# Patient Record
Sex: Female | Born: 1975 | Race: Black or African American | Hispanic: Yes | Marital: Single | Smoking: Former smoker
Health system: Southern US, Community
[De-identification: ages and names within clinical notes are randomized; demographics above are authoritative.]

## PROBLEM LIST (undated history)

## (undated) DIAGNOSIS — I2699 Other pulmonary embolism without acute cor pulmonale: Secondary | ICD-10-CM

## (undated) DIAGNOSIS — I429 Cardiomyopathy, unspecified: Secondary | ICD-10-CM

## (undated) DIAGNOSIS — I10 Essential (primary) hypertension: Secondary | ICD-10-CM

## (undated) DIAGNOSIS — I509 Heart failure, unspecified: Secondary | ICD-10-CM

## (undated) HISTORY — DX: Other pulmonary embolism without acute cor pulmonale: I26.99

## (undated) HISTORY — DX: Heart failure, unspecified: I50.9

## (undated) NOTE — *Deleted (*Deleted)
***In Progress*** PCP: Raliegh Ip FNP  Primary Cardiologist: Dr Gala Romney   HPI:  Alexandria Rhodes a 65 y.o.femalewith a historyofnon-ischemic cardiomyopathy with EF as low as 10% in 2016but most recent 65-70% on Echo in 03/2018, prior PE in 03/2018 no longer on anticoagulation, migraine, and hypertension.  Shewas admitted to a hospital in [place] in 08/2014 after presenting with chest pain and shortness of breath. She was found to have an EF of 10%. She states she underwent heart cath which showed no CAD. She was [facility] [place]. She was started onguideline medical therapy and was onEntresto 97-103 mg twice daily,carvedilol 6.25 mg twice daily,and Spironolactone 12.5 mg daily.She moved to [place] at the end of 2019.  Admitted to [facility] 03/2018 with chest pain and found to have an acute PE felt to be provoked by recent move from [place]. Echo at that time showed LVEF of 65-70% with normal wall motion, grade 1 diastolic dysfunction, trivial MR, normal biatrial size, normal RV function, and dilated IVC.She was started on Eliquis but was only able to complete 1 month of therapy due to cost. She also could not afford her heart failure medications and has not been on anything in the past year.  Admitted to [facility] on 07/20/19 with increased shortness of breath. She had been out of her medications for a week. Diuresed with IV furosemide and started on HF meds. D/C weight was 156 pounds. Given all medications from HF fund at discharge. Referred to HF social work to help with meds and transportation to and from appts.   She was seen for initial post hospital follow-up back in June and was doing fairly well from a cardiac standpoint.  She did however endorse a rash on her torso and upper extremities which she felt was most likely secondary to digoxin since it was a new medication that had just been started. Digoxin was  discontinued.  Due to soft BP, we were unable to further titrate her medications at that time.  She was instructed to follow-up with pharmacy 2 to 3 weeks later for further med titration however she canceled the appointment and did not reschedule.   She returned to clinic with [name], PA, on 12/14/19 for follow-up. She is currently working at [employer]. She reported compliance with most of her meds but she ran out of spironolactone and carvedilol 3 days ago. HR 55 bpm off ? blocker. BP soft 105/70. Weight up 7 lb since last OV.  Reports stable NYHA class II symptoms  Today he returns to HF clinic for pharmacist medication titration. At last visit with [name], PA, Farxiga 10 mg daily was initiated and spironolactone was re-initiated at a dose of 12.5 mg daily.   Overall feeling ***. Dizziness, lightheadedness, fatigue:  Chest pain or palpitations.  How is your breathing?: *** SOB Able to complete all ADLs. Activity level ***  Weight at home pounds. Takes furosemide/torsemide/bumex *** mg *** daily.  PND/Orthopnea:   Appetite ***.   HF Medications: Entresto 24/26 mg BID  Spironolactone 12.5 mg daily  Dapagliflozin 10 mg daily  Furosemide 40 mg PRN  Potassium chloride 20 mg PRN with furosemide  Has the patient been experiencing any side effects to the medications prescribed?  {YES NO:22349}  Does the patient have any problems obtaining medications due to transportation or finances?   {YES NO:22349} Uninsured, approved for [employer] patient assistance. Transportation set up by CSW  Understanding of regimen: {  excellent/good/fair/poor:19665} Understanding of indications: {excellent/good/fair/poor:19665} Potential of compliance: {excellent/good/fair/poor:19665} Patient understands to avoid NSAIDs. Patient understands to avoid decongestants.  Pertinent Lab Values 08/22/19:  Serum creatinine 1.09, BUN 20, Potassium 5.1, Sodium 142  Vital Signs: . Weight: *** (last  clinic weight: 160 lbs) . Blood pressure:  . Heart rate:   Assessment: 1. Chronic Combined Heart Failure  - EF as low as 10% back in 2016. She was started on guideline based medications and EF improved to 65-70%on echo in 03/2018.  -ECHO 5/21 EF back down to 20-25% (in the setting of poor med compliance) with global hypokinesis, grade II diastolic dysfunction, severely dilated left atrium, moderate MR, and trivial AI. RV size normal with systolic function mildly reduced. GDMT restarted.  - NYHA II. Volume status trending up.  Weight up 7 pounds from last office visit  - Continue furosemide 40 mg daily - Continue Entresto 24/26 mg BID -Continue spironolactone 12.5 mg QHS -Continue dapagliflozin 10 mg daily -She is off digoxin given previous rash - Discussed importance of daily weights and medication compliance.  - Plan to repeat ECHO  after HF medications optimized.   2. HTN -Soft but stable -See med recs above   3. History of PE - Provoked in the setting of prolonged travel - Previously treated with Eliquis. No longer on anticoagulation    Plan: 1) Medication changes: Based on clinical presentation, vital signs and recent labs will *** 2) Labs: *** 3) Follow-up with APP clinic in 4 weeks   Karle Plumber, PharmD, BCPS, BCCP, CPP Heart Failure Clinic Pharmacist [phone]

---

## 2018-03-27 ENCOUNTER — Other Ambulatory Visit: Payer: Self-pay

## 2018-03-27 ENCOUNTER — Inpatient Hospital Stay (HOSPITAL_COMMUNITY)
Admission: EM | Admit: 2018-03-27 | Discharge: 2018-03-29 | DRG: 176 | Disposition: A | Discharge status: Home / Self Care | Payer: Self-pay | Attending: Internal Medicine | Admitting: Internal Medicine

## 2018-03-27 ENCOUNTER — Emergency Department (HOSPITAL_COMMUNITY): Payer: Self-pay

## 2018-03-27 ENCOUNTER — Encounter (HOSPITAL_COMMUNITY): Payer: Self-pay | Admitting: Obstetrics and Gynecology

## 2018-03-27 DIAGNOSIS — I5022 Chronic systolic (congestive) heart failure: Secondary | ICD-10-CM | POA: Diagnosis present

## 2018-03-27 DIAGNOSIS — I110 Hypertensive heart disease with heart failure: Secondary | ICD-10-CM | POA: Diagnosis present

## 2018-03-27 DIAGNOSIS — I428 Other cardiomyopathies: Secondary | ICD-10-CM | POA: Diagnosis present

## 2018-03-27 DIAGNOSIS — R072 Precordial pain: Secondary | ICD-10-CM

## 2018-03-27 DIAGNOSIS — Z79899 Other long term (current) drug therapy: Secondary | ICD-10-CM

## 2018-03-27 DIAGNOSIS — I10 Essential (primary) hypertension: Secondary | ICD-10-CM | POA: Diagnosis present

## 2018-03-27 DIAGNOSIS — I2699 Other pulmonary embolism without acute cor pulmonale: Principal | ICD-10-CM | POA: Diagnosis present

## 2018-03-27 DIAGNOSIS — Z91013 Allergy to seafood: Secondary | ICD-10-CM

## 2018-03-27 DIAGNOSIS — R0602 Shortness of breath: Secondary | ICD-10-CM

## 2018-03-27 HISTORY — DX: Cardiomyopathy, unspecified: I42.9

## 2018-03-27 HISTORY — DX: Essential (primary) hypertension: I10

## 2018-03-27 LAB — POCT I-STAT TROPONIN I
Troponin i, poc: 0 ng/mL (ref 0.00–0.08)
Troponin i, poc: 0.01 ng/mL (ref 0.00–0.08)

## 2018-03-27 LAB — BASIC METABOLIC PANEL
Anion gap: 9 (ref 5–15)
BUN: 16 mg/dL (ref 6–20)
CO2: 23 mmol/L (ref 22–32)
Calcium: 9.1 mg/dL (ref 8.9–10.3)
Chloride: 111 mmol/L (ref 98–111)
Creatinine, Ser: 0.74 mg/dL (ref 0.44–1.00)
GFR calc Af Amer: >60 mL/min (ref >60)
GFR calc non Af Amer: >60 mL/min (ref >60)
Glucose, Bld: 97 mg/dL (ref 70–99)
Potassium: 4 mmol/L (ref 3.5–5.1)
Sodium: 143 mmol/L (ref 135–145)

## 2018-03-27 LAB — BRAIN NATRIURETIC PEPTIDE: B NATRIURETIC PEPTIDE 5: 25.8 pg/mL (ref 0.0–100.0)

## 2018-03-27 LAB — CBC
HCT: 37.1 % (ref 36.0–46.0)
Hemoglobin: 11.3 g/dL — ABNORMAL LOW (ref 12.0–15.0)
MCH: 28 pg (ref 26.0–34.0)
MCHC: 30.5 g/dL (ref 30.0–36.0)
MCV: 92.1 fL (ref 80.0–100.0)
Platelets: 300 10*3/uL (ref 150–400)
RBC: 4.03 MIL/uL (ref 3.87–5.11)
RDW: 13.4 % (ref 11.5–15.5)
WBC: 7.6 10*3/uL (ref 4.0–10.5)
nRBC: 0 % (ref 0.0–0.2)

## 2018-03-27 LAB — HCG, QUANTITATIVE, PREGNANCY: HCG, BETA CHAIN, QUANT, S: 9 m[IU]/mL — AB (ref <5)

## 2018-03-27 LAB — HCG, SERUM, QUALITATIVE: Preg, Serum: POSITIVE — AB

## 2018-03-27 LAB — MAGNESIUM: Magnesium: 2 mg/dL (ref 1.7–2.4)

## 2018-03-27 LAB — I-STAT BETA HCG BLOOD, ED (NOT ORDERABLE): I-stat hCG, quantitative: 9.4 m[IU]/mL — ABNORMAL HIGH (ref <5)

## 2018-03-27 LAB — D-DIMER, QUANTITATIVE: D-Dimer, Quant: 1.1 ug/mL-FEU — ABNORMAL HIGH (ref 0.00–0.50)

## 2018-03-27 MED ORDER — SPIRONOLACTONE 12.5 MG HALF TABLET
12.5000 mg | ORAL_TABLET | Freq: Every day | ORAL | Status: DC
  Administered 2018-03-28 – 2018-03-29 (×2): 12.5 mg via ORAL
  Filled 2018-03-27 (×2): qty 1

## 2018-03-27 MED ORDER — ONDANSETRON HCL 4 MG PO TABS: 4.0000 mg | ORAL_TABLET | Freq: Four times a day (QID) | ORAL | Status: DC | PRN

## 2018-03-27 MED ORDER — ONDANSETRON HCL 4 MG/2ML IJ SOLN: 4.0000 mg | Freq: Four times a day (QID) | INTRAMUSCULAR | Status: DC | PRN

## 2018-03-27 MED ORDER — SODIUM CHLORIDE (PF) 0.9 % IJ SOLN
INTRAMUSCULAR | Status: AC
  Filled 2018-03-27: qty 50

## 2018-03-27 MED ORDER — ACETAMINOPHEN 325 MG PO TABS
650.0000 mg | ORAL_TABLET | Freq: Four times a day (QID) | ORAL | Status: DC | PRN
  Administered 2018-03-28 (×2): 650 mg via ORAL
  Filled 2018-03-27 (×2): qty 2

## 2018-03-27 MED ORDER — SACUBITRIL-VALSARTAN 97-103 MG PO TABS
1.0000 | ORAL_TABLET | Freq: Two times a day (BID) | ORAL | Status: DC
  Administered 2018-03-28 – 2018-03-29 (×3): 1 via ORAL
  Filled 2018-03-27 (×5): qty 1

## 2018-03-27 MED ORDER — HEPARIN BOLUS VIA INFUSION
2000.0000 [IU] | Freq: Once | INTRAVENOUS | Status: AC
  Administered 2018-03-28: 2000 [IU] via INTRAVENOUS
  Filled 2018-03-27: qty 2000

## 2018-03-27 MED ORDER — ACETAMINOPHEN 325 MG PO TABS: 650.0000 mg | ORAL_TABLET | Freq: Four times a day (QID) | ORAL | Status: DC | PRN

## 2018-03-27 MED ORDER — ACETAMINOPHEN 650 MG RE SUPP: 650.0000 mg | Freq: Four times a day (QID) | RECTAL | Status: DC | PRN

## 2018-03-27 MED ORDER — IOPAMIDOL (ISOVUE-370) INJECTION 76%
INTRAVENOUS | Status: AC
  Filled 2018-03-27: qty 100

## 2018-03-27 MED ORDER — IOPAMIDOL (ISOVUE-370) INJECTION 76%
100.0000 mL | Freq: Once | INTRAVENOUS | Status: AC | PRN
  Administered 2018-03-27: 100 mL via INTRAVENOUS

## 2018-03-27 MED ORDER — HEPARIN (PORCINE) 25000 UT/250ML-% IV SOLN
1300.0000 [IU]/h | INTRAVENOUS | Status: DC
  Administered 2018-03-28: 1300 [IU]/h via INTRAVENOUS
  Filled 2018-03-27: qty 250

## 2018-03-27 MED ORDER — CARVEDILOL 6.25 MG PO TABS
6.2500 mg | ORAL_TABLET | Freq: Two times a day (BID) | ORAL | Status: DC
  Administered 2018-03-28 – 2018-03-29 (×4): 6.25 mg via ORAL
  Filled 2018-03-27 (×4): qty 1

## 2018-03-27 NOTE — H&P (Signed)
History and Physical    Alexandria Rhodes OIN:[id number] DOB: [birth year] DOA: 03/27/2018  PCP: Patient, No Pcp Per  Patient coming from: Home  I have personally briefly reviewed patient's old medical records in [facility]  Chief Complaint: Chest pain  HPI: Alexandria Rhodes is a 43 y.o. female with medical history significant of cardiomyopathy, presumably non-ischemic since they "couldn't figure out why" heart pumping was reduced.  HTN.  EF 10% initially in 2017 then repeat echo later that same year showed 35%.  She is on coreg, entresto, aldactone.  Missed entresto for past month though.  Not on anti-platelet agents or statins (also a hint that its NICM).  Patient recently moved to area from [place].  Presents to ED with c/o chest pressure, DOE.  Symptoms onset 2 days ago, persistent.  Associated intermittent sharp chest pain.  No hemoptysis.  Intermittently LE edema.   ED Course: CTA positive for PE.  b-HCG slightly positive at 9.4, patient states she is post-menopausal with last period at least 4+ months ago, last intercourse was "a long time ago".   Review of Systems: As per HPI otherwise 10 point review of systems negative.   Past Medical History:  Diagnosis Date  . Cardiomyopathy (HCC)   . Hypertension     History reviewed. No pertinent surgical history.   reports that she has never smoked. She does not have any smokeless tobacco history on file. She reports previous alcohol use. She reports previous drug use.  Allergies  Allergen Reactions  . Shellfish Allergy Anaphylaxis    No family history on file. No FHx   Prior to Admission medications   Medication Sig Start Date End Date Taking? Authorizing Provider  acetaminophen (TYLENOL) 325 MG tablet Take 650 mg by mouth every 6 (six) hours as needed for moderate pain or headache.   Yes [provider]  carvedilol (COREG) 6.25 MG tablet Take 6.25 mg by mouth 2 (two) times daily with a meal.   Yes [provider]  sacubitril-valsartan (ENTRESTO) 97-103 MG Take 1 tablet by mouth 2 (two) times daily.   Yes [provider]  spironolactone (ALDACTONE) 25 MG tablet Take 12.5 mg by mouth daily.   Yes [provider]  SUMAtriptan (IMITREX) 50 MG tablet Take 50 mg by mouth every 2 (two) hours as needed for migraine. May repeat in 2 hours if headache persists or recurs.   Yes [provider]    Physical Exam: Vitals:   03/27/18 1856 03/27/18 1901 03/27/18 2100  BP: (!) 171/102  (!) 165/98  Pulse: 76  69  Resp: 16  15  Temp: 98.6 F (37 C)    TempSrc: Oral    SpO2: 99%  97%  Weight:  91.2 kg   Height:  5\' 2"  (1.575 m)     Constitutional: NAD, calm, comfortable Eyes: PERRL, lids and conjunctivae normal ENMT: Mucous membranes are moist. Posterior pharynx clear of any exudate or lesions.Normal dentition.  Neck: normal, supple, no masses, no thyromegaly Respiratory: clear to auscultation bilaterally, no wheezing, no crackles. Normal respiratory effort. No accessory muscle use.  Cardiovascular: Systolic murmur. Mild peripheral edema.  Abdomen: no tenderness, no masses palpated. No hepatosplenomegaly. Bowel sounds positive.  Musculoskeletal: no clubbing / cyanosis. No joint deformity upper and lower extremities. Good ROM, no contractures. Normal muscle tone.  Skin: no rashes, lesions, ulcers. No induration Neurologic: CN 2-12 grossly intact. Sensation intact, DTR normal. Strength 5/5 in all 4.  Psychiatric: Normal judgment and insight. Alert and  oriented x 3. Normal mood.    Labs on Admission: I have personally reviewed following labs and imaging studies  CBC: Recent Labs  Lab 03/27/18 1930  WBC 7.6  HGB 11.3*  HCT 37.1  MCV 92.1  PLT 300   Basic Metabolic Panel: Recent Labs  Lab 03/27/18 1930 03/27/18 1942  NA 143  --   K 4.0  --   CL 111  --   CO2 23  --   GLUCOSE 97  --   BUN 16  --   CREATININE 0.74  --   CALCIUM 9.1  --   MG  --  2.0    GFR: Estimated Creatinine Clearance: 95.2 mL/min (by C-G formula based on SCr of 0.74 mg/dL). Liver Function Tests: No results for input(s): AST, ALT, ALKPHOS, BILITOT, PROT, ALBUMIN in the last 168 hours. No results for input(s): LIPASE, AMYLASE in the last 168 hours. No results for input(s): AMMONIA in the last 168 hours. Coagulation Profile: No results for input(s): INR, PROTIME in the last 168 hours. Cardiac Enzymes: No results for input(s): CKTOTAL, CKMB, CKMBINDEX, TROPONINI in the last 168 hours. BNP (last 3 results) No results for input(s): PROBNP in the last 8760 hours. HbA1C: No results for input(s): HGBA1C in the last 72 hours. CBG: No results for input(s): GLUCAP in the last 168 hours. Lipid Profile: No results for input(s): CHOL, HDL, LDLCALC, TRIG, CHOLHDL, LDLDIRECT in the last 72 hours. Thyroid Function Tests: No results for input(s): TSH, T4TOTAL, FREET4, T3FREE, THYROIDAB in the last 72 hours. Anemia Panel: No results for input(s): VITAMINB12, FOLATE, FERRITIN, TIBC, IRON, RETICCTPCT in the last 72 hours. Urine analysis: No results found for: COLORURINE, APPEARANCEUR, LABSPEC, PHURINE, GLUCOSEU, HGBUR, BILIRUBINUR, KETONESUR, PROTEINUR, UROBILINOGEN, NITRITE, LEUKOCYTESUR  Radiological Exams on Admission: Dg Chest 2 View  Result Date: 03/27/2018 CLINICAL DATA:  Shortness of breath. EXAM: CHEST - 2 VIEW COMPARISON:  None. FINDINGS: The lungs are clear without focal pneumonia, edema, pneumothorax or pleural effusion. Cardiopericardial silhouette is at upper limits of normal for size. The visualized bony structures of the thorax are intact. Telemetry leads overlie the chest. IMPRESSION: No active cardiopulmonary disease. Electronically Signed   By: Kennith Center M.D.   On: 03/27/2018 19:52   Ct Angio Chest Pe W And/or Wo Contrast  Result Date: 03/27/2018 CLINICAL DATA:  43 year old with acute onset of mid chest pain and shortness of breath with exertion. Positive  D-dimer. EXAM: CT ANGIOGRAPHY CHEST WITH CONTRAST TECHNIQUE: Multidetector CT imaging of the chest was performed using the standard protocol during bolus administration of intravenous contrast. Multiplanar CT image reconstructions and MIPs were obtained to evaluate the vascular anatomy. CONTRAST:  ISOVUE-370 IOPAMIDOL INJECTION 76% IV. COMPARISON:  None. FINDINGS: Cardiovascular: Contrast opacification of the pulmonary arteries is very good. Filling defect within a segmental branch of the RIGHT UPPER LOBE pulmonary artery. No evidence of acute pulmonary embolism elsewhere in either lung. No evidence of RIGHT heart strain. Heart mildly enlarged with LEFT ventricular enlargement. No visible coronary atherosclerosis. No pericardial effusion. No visible atherosclerosis involving the thoracic or proximal abdominal aorta. No evidence of aortic aneurysm. Mediastinum/Nodes: No pathologically enlarged mediastinal, hilar or axillary lymph nodes. No mediastinal masses. Normal-appearing esophagus. Normal-appearing thyroid gland. Lungs/Pleura: Low lung volumes with mild atelectasis in the lower lobes. Lung parenchyma otherwise clear. No confluent airspace consolidation. No evidence of interstitial lung disease. No parenchymal nodules or masses. No pleural effusions. Central airways patent with mild bronchial wall thickening. Upper Abdomen: Visualized upper abdomen unremarkable  for the early arterial phase of enhancement. Musculoskeletal: Regional skeleton unremarkable without acute or significant osseous abnormality. Review of the MIP images confirms the above findings. IMPRESSION: 1. Acute pulmonary embolism involving a segmental branch of the RIGHT UPPER LOBE pulmonary artery. Clot burden is small as there is no evidence of embolism elsewhere in either lung. No evidence of RIGHT heart strain. 2. Low lung volumes with mild atelectasis in the lower lobes. No acute cardiopulmonary disease otherwise. I telephoned these  critical/emergent results to Dr. Rush Landmarkegeler of the emergency department at the time of interpretation on 03/27/2018 at 9:48 p.m. Electronically Signed   By: Hulan Saashomas  Lawrence M.D.   On: 03/27/2018 21:49    EKG: Independently reviewed.  Assessment/Plan Principal Problem:   Acute pulmonary embolism (HCC) Active Problems:   Chronic systolic CHF (congestive heart failure) (HCC)   HTN (hypertension)    1. PE - 1. Heparin gtt 2. Tele monitor 3. 2d echo 4. BLE venous duplex 2. Chronic systolic CHF - 1. 2d echo 2. Resume entresto 3. Continue aldactone 3. HTN - 1. Resume entresto 2. Continue coreg 4. b-HCG - 9.4 1. Believed to represent a false positive 2. No intercourse in "a long time", b-HCG of this level would be <[redacted] week gestation. 3. Also believed to be post-menopausal  DVT prophylaxis: Heparin gtt Code Status: Full Family Communication: Family at bedside Disposition Plan: Home after admit Consults called: None Admission status: [place]   Waldon Sheerin, [place] M. DO [facility] Pager [phone] Only works nights!  If 7AM-7PM, please contact the primary day team physician taking care of patient  [email or web address] Password [email or web address]  03/27/2018, 10:50 PM

## 2018-03-27 NOTE — ED Triage Notes (Signed)
Pt reports she just moved here and was on entresta for her heart as she has a hx of cardiomyopathy, but she has not had her entresta for over a month. Pt reports with the move and her new job and not having her medicine, she has started to feel a strain on her chest and has been having a lot of pressure on her chest. Pt reports she is also on BP medication.

## 2018-03-27 NOTE — ED Provider Notes (Signed)
[facility]-EMERGENCY DEPT Provider Note   CSN: [id number] Arrival date & time: 03/27/18  1842     History   Chief Complaint Chief Complaint  Patient presents with  . Chest Pain    HPI Alexandria Rhodes is a 43 y.o. female.  The history is provided by the patient.  Shortness of Breath  Severity:  Moderate Onset quality:  Gradual Duration:  2 days Timing:  Constant Progression:  Waxing and waning Chronicity:  New Relieved by:  Nothing Worsened by:  Exertion, deep breathing and coughing Ineffective treatments:  None tried Associated symptoms: chest pain   Associated symptoms: no abdominal pain, no cough, no diaphoresis, no fever, no headaches, no neck pain, no sputum production, no vomiting and no wheezing     Past Medical History:  Diagnosis Date  . Cardiomyopathy (HCC)   . Hypertension     There are no active problems to display for this patient.   History reviewed. No pertinent surgical history.   OB History    Gravida      Para      Term      Preterm      AB      Living  0     SAB      TAB      Ectopic      Multiple      Live Births               Home Medications    Prior to Admission medications   Not on File    Family History No family history on file.  Social History Social History   Tobacco Use  . Smoking status: Never Smoker  Substance Use Topics  . Alcohol use: Not Currently  . Drug use: Not Currently     Allergies   Shellfish allergy   Review of Systems Review of Systems  Constitutional: Negative for chills, diaphoresis, fatigue and fever.  HENT: Negative for congestion.   Eyes: Negative for visual disturbance.  Respiratory: Positive for chest tightness and shortness of breath. Negative for cough, sputum production, wheezing and stridor.   Cardiovascular: Positive for chest pain and leg swelling (mild). Negative for palpitations.  Gastrointestinal: Negative for abdominal pain,  constipation, diarrhea, nausea and vomiting.  Genitourinary: Negative for dysuria and flank pain.  Musculoskeletal: Negative for back pain, neck pain and neck stiffness.  Neurological: Negative for light-headedness, numbness and headaches.  Psychiatric/Behavioral: Negative for agitation and confusion.  All other systems reviewed and are negative.    Physical Exam Updated Vital Signs BP (!) 171/102 (BP Location: Left Arm)   Pulse 76   Temp 98.6 F (37 C) (Oral)   Resp 16   Ht 5\' 2"  (1.575 m)   Wt 91.2 kg   LMP 03/27/2014 (Approximate)   SpO2 99%   BMI 36.76 kg/m   Physical Exam Vitals signs and nursing note reviewed.  Constitutional:      General: She is not in acute distress.    Appearance: She is well-developed. She is not ill-appearing, toxic-appearing or diaphoretic.  HENT:     Head: Normocephalic and atraumatic.  Eyes:     Conjunctiva/sclera: Conjunctivae normal.     Pupils: Pupils are equal, round, and reactive to light.  Neck:     Musculoskeletal: Normal range of motion and neck supple.  Cardiovascular:     Rate and Rhythm: Regular rhythm. Tachycardia present.     Heart sounds: Murmur present.  Pulmonary:  Effort: Pulmonary effort is normal. No respiratory distress.     Breath sounds: Rales present. No decreased breath sounds, wheezing or rhonchi.  Abdominal:     Palpations: Abdomen is soft.     Tenderness: There is no abdominal tenderness.  Musculoskeletal:     Right lower leg: She exhibits no tenderness. Edema present.     Left lower leg: She exhibits no tenderness. Edema present.  Skin:    General: Skin is warm and dry.     Capillary Refill: Capillary refill takes less than 2 seconds.  Neurological:     General: No focal deficit present.     Mental Status: She is alert and oriented to person, place, and time.     Cranial Nerves: No cranial nerve deficit.      ED Treatments / Results  Labs (all labs ordered are listed, but only abnormal results  are displayed) Labs Reviewed  CBC - Abnormal; Notable for the following components:      Result Value   Hemoglobin 11.3 (*)    All other components within normal limits  D-DIMER, QUANTITATIVE (NOT AT [facility]) - Abnormal; Notable for the following components:   D-Dimer, Quant 1.10 (*)    All other components within normal limits  HCG, SERUM, QUALITATIVE - Abnormal; Notable for the following components:   Preg, Serum POSITIVE (*)    All other components within normal limits  HCG, QUANTITATIVE, PREGNANCY - Abnormal; Notable for the following components:   hCG, Beta Chain, Quant, S 9 (*)    All other components within normal limits  I-STAT BETA HCG BLOOD, ED (NOT ORDERABLE) - Abnormal; Notable for the following components:   I-stat hCG, quantitative 9.4 (*)    All other components within normal limits  BASIC METABOLIC PANEL  BRAIN NATRIURETIC PEPTIDE  MAGNESIUM  HIV ANTIBODY (ROUTINE TESTING W REFLEX)  CBC  BASIC METABOLIC PANEL  HEPARIN LEVEL (UNFRACTIONATED)  I-STAT TROPONIN, ED  I-STAT BETA HCG BLOOD, ED ([facility], [facility], [facility] ONLY)  POCT I-STAT TROPONIN I  I-STAT TROPONIN, ED    EKG EKG Interpretation  Date/Time:  Sunday March 27 2018 18:55:50 EST Ventricular Rate:  67 PR Interval:    QRS Duration: 96 QT Interval:  408 QTC Calculation: 431 R Axis:   3 Text Interpretation:  Sinus rhythm Left ventricular hypertrophy Nonspecific T abnormalities, anterior leads No prior ECG for comparison.  NO STEMI Confirmed by Theda Belfast ([id number]) on 03/27/2018 7:20:13 PM   Radiology Dg Chest 2 View  Result Date: 03/27/2018 CLINICAL DATA:  Shortness of breath. EXAM: CHEST - 2 VIEW COMPARISON:  None. FINDINGS: The lungs are clear without focal pneumonia, edema, pneumothorax or pleural effusion. Cardiopericardial silhouette is at upper limits of normal for size. The visualized bony structures of the thorax are intact. Telemetry leads overlie the chest. IMPRESSION: No active cardiopulmonary disease.  Electronically Signed   By: Kennith Center M.D.   On: 03/27/2018 19:52   Ct Angio Chest Pe W And/or Wo Contrast  Result Date: 03/27/2018 CLINICAL DATA:  43 year old with acute onset of mid chest pain and shortness of breath with exertion. Positive D-dimer. EXAM: CT ANGIOGRAPHY CHEST WITH CONTRAST TECHNIQUE: Multidetector CT imaging of the chest was performed using the standard protocol during bolus administration of intravenous contrast. Multiplanar CT image reconstructions and MIPs were obtained to evaluate the vascular anatomy. CONTRAST:  ISOVUE-370 IOPAMIDOL INJECTION 76% IV. COMPARISON:  None. FINDINGS: Cardiovascular: Contrast opacification of the pulmonary arteries is very good. Filling defect within a segmental  branch of the RIGHT UPPER LOBE pulmonary artery. No evidence of acute pulmonary embolism elsewhere in either lung. No evidence of RIGHT heart strain. Heart mildly enlarged with LEFT ventricular enlargement. No visible coronary atherosclerosis. No pericardial effusion. No visible atherosclerosis involving the thoracic or proximal abdominal aorta. No evidence of aortic aneurysm. Mediastinum/Nodes: No pathologically enlarged mediastinal, hilar or axillary lymph nodes. No mediastinal masses. Normal-appearing esophagus. Normal-appearing thyroid gland. Lungs/Pleura: Low lung volumes with mild atelectasis in the lower lobes. Lung parenchyma otherwise clear. No confluent airspace consolidation. No evidence of interstitial lung disease. No parenchymal nodules or masses. No pleural effusions. Central airways patent with mild bronchial wall thickening. Upper Abdomen: Visualized upper abdomen unremarkable for the early arterial phase of enhancement. Musculoskeletal: Regional skeleton unremarkable without acute or significant osseous abnormality. Review of the MIP images confirms the above findings. IMPRESSION: 1. Acute pulmonary embolism involving a segmental branch of the RIGHT UPPER LOBE pulmonary  artery. Clot burden is small as there is no evidence of embolism elsewhere in either lung. No evidence of RIGHT heart strain. 2. Low lung volumes with mild atelectasis in the lower lobes. No acute cardiopulmonary disease otherwise. I telephoned these critical/emergent results to Dr. Rush Landmark of the emergency department at the time of interpretation on 03/27/2018 at 9:48 p.m. Electronically Signed   By: Hulan Saas M.D.   On: 03/27/2018 21:49    Procedures Procedures (including critical care time)  CRITICAL CARE Performed by: Canary Brim Tegeler Total critical care time: 35 minutes Critical care time was exclusive of separately billable procedures and treating other patients. CHF and new PE needing heparin and admission.  Critical care was necessary to treat or prevent imminent or life-threatening deterioration. Critical care was time spent personally by me on the following activities: development of treatment plan with patient and/or surrogate as well as nursing, discussions with consultants, evaluation of patient's response to treatment, examination of patient, obtaining history from patient or surrogate, ordering and performing treatments and interventions, ordering and review of laboratory studies, ordering and review of radiographic studies, pulse oximetry and re-evaluation of patient's condition.   Medications Ordered in ED Medications  sodium chloride (PF) 0.9 % injection (has no administration in time range)  iopamidol (ISOVUE-370) 76 % injection (has no administration in time range)  acetaminophen (TYLENOL) tablet 650 mg (has no administration in time range)    Or  acetaminophen (TYLENOL) suppository 650 mg (has no administration in time range)  ondansetron (ZOFRAN) tablet 4 mg (has no administration in time range)    Or  ondansetron (ZOFRAN) injection 4 mg (has no administration in time range)  spironolactone (ALDACTONE) tablet 12.5 mg (has no administration in time range)    sacubitril-valsartan (ENTRESTO) 97-103 mg per tablet (has no administration in time range)  carvedilol (COREG) tablet 6.25 mg (has no administration in time range)  heparin bolus via infusion 2,000 Units (has no administration in time range)  heparin ADULT infusion 100 units/mL (25000 units/29mL sodium chloride 0.45%) (has no administration in time range)  iopamidol (ISOVUE-370) 76 % injection 100 mL (100 mLs Intravenous Contrast Given 03/27/18 2109)     Initial Impression / Assessment and Plan / ED Course  I have reviewed the triage vital signs and the nursing notes.  Pertinent labs & imaging results that were available during my care of the patient were reviewed by me and considered in my medical decision making (see chart for details).     Bular Hickok is a 43 y.o. 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  Patient reports that she recently moved from [place] where she had had all of her previous care and has not yet been established with a doctor in [place].  She says that for the last month and a half she has been out of her Sherryll Burger but has continued to take her beta-blocker and spironolactone.  She reports that she has had 2 days of exertional shortness of breath symptoms.  She is also had 2 days of intermittent sharp chest pain.  She reports that she has had a constant chest pressure discomfort during this time.  She reports no nausea or vomiting.  She reports that she had significant shortness of breath that was extremely pleuritic.  The shortness of breath is exertional.  She reports some recent cough but denies current cough.  No hemoptysis.  She reports her legs have been intermittently edematous.  She reports that she was on Lasix initially when she was diagnosed with cardiomyopathy but has not been on it recently.  She reports no  constipation or diarrhea but does report she has had urinary frequency.  She says that when she walks she gets extremely winded and short of breath which is new for her.  She reports that [facility] [place] however care everywhere was unable to find further records.  On exam, patient has crackles in the bases of her lungs.  Patient has a systolic murmur.  Chest was tender to palpation causing the sharp discomfort.  Legs have mild edema.  Patient has symmetric radial and lower extremity pulses.  Back nontender and CVA areas nontender.  Clinical I suspect the patient's sharp pains are musculoskeletal in nature however the pressure pain and exertional shortness of breath and pleuritic discomfort is concerning for either CHF exacerbation or even pulmonary embolism given her recent driving to and from [place] for the move.  She will have laboratory testing and chest x-ray and blood work to further evaluate.  Initial troponin was negative.  Chest x-ray shows no acute cardiopulmonary abnormality.  BMP and CBC reassuring aside from mild anemia.  Anticipate reassessment after work-up.  Patient will likely have ambulation with pulse ox to determine disposition.  10:32 PM Patient's i-STAT hCG was positive with a low amount.  Qualitative hCG from serum was checked and it was reported to be positive.  Patient will have a quantitative hCG serum checked.  When patient was informed of our possible pregnancy work-up, she reports that she has not had a menstrual cycle in 4 years and has not had intercourse in several months.  She has a very low suspicion for pregnancy however we will get the quantitative testing performed.  Due to the patient's heart failure, her intermittent chest discomfort, and the new discovery of pulmonary embolism, patient will be started on blood thinners and will be admitted for further monitoring, trending of troponin, and likely echo.  Patient be  admitted for further management.  Final Clinical Impressions(s) / ED Diagnoses   Final diagnoses:  Precordial pain  Shortness of breath  Acute pulmonary embolism, unspecified pulmonary embolism type, unspecified whether acute cor pulmonale present [facility])    ED Discharge Orders    None      Clinical Impression: 1. Precordial pain   2. Shortness of breath   3. Acute pulmonary embolism, unspecified pulmonary  embolism type, unspecified whether acute cor pulmonale present [facility](HCC)     Disposition: Admit  This note was prepared with assistance of Dragon voice recognition software. Occasional wrong-word or sound-a-like substitutions may have occurred due to the inherent limitations of voice recognition software.         Tegeler, Canary Brimhristopher J, MD 03/27/18 2322

## 2018-03-27 NOTE — Progress Notes (Addendum)
ANTICOAGULATION CONSULT NOTE   Pharmacy Consult for heparin Indication: acute pulmonary embolus  Allergies  Allergen Reactions  . Shellfish Allergy Anaphylaxis    Patient Measurements: Height: 5\' 2"  (157.5 cm) Weight: 201 lb (91.2 kg) IBW/kg (Calculated) : 50.1 Heparin Dosing Weight: 71  Vital Signs: Temp: 98.6 F (37 C) (01/12 1856) Temp Source: Oral (01/12 1856) BP: 165/98 (01/12 2100) Pulse Rate: 69 (01/12 2100)  Labs: Recent Labs    03/27/18 1930  HGB 11.3*  HCT 37.1  PLT 300  CREATININE 0.74    Estimated Creatinine Clearance: 95.2 mL/min (by C-G formula based on SCr of 0.74 mg/dL).   Assessment: Patient's a 43 y.o F presented to the ED on 1/12 with c/o CP/tightness. D-dimer elevated at 1.10.  Chest CTA showed acute RUL PE (small clot burden).  To start heparin drip for acute PE.   Goal of Therapy:  Heparin level 0.3-0.7 units/ml Monitor platelets by anticoagulation protocol: Yes   Plan:  - heparin 2000 units IV bolus x1, then drip at 1300 units/hr (per [place] calc) - check 6 hr heparin level - monitor for s/s bleeding  Alexandria Rhodes P 03/27/2018,10:59 PM  ___________________________________  Adden:  First heparin level now back therapeutic at 0.59.  Continue with current rate for now and check another level at 11a to confirm level is still therapeutic before changing to daily monitoring.  Alexandria Rhodes, PharmD, BCPS 03/28/2018 5:42 AM

## 2018-03-27 NOTE — ED Notes (Signed)
ED TO INPATIENT HANDOFF REPORT  Name/Age/Gender Alexandria Rhodes 43 y.o. female  Code Status    Code Status Orders  (From admission, onward)         Start     Ordered   03/27/18 2243  Full code  Continuous     03/27/18 2243        Code Status History    This patient has a current code status but no historical code status.      Home/SNF/Other Home  Chief Complaint Chest Pain   Level of Care/Admitting Diagnosis ED Disposition    ED Disposition Condition Comment   Admit  Hospital Area: [facility] [[id number]]  Level of Care: Telemetry [5]  Admit to tele based on following criteria: Other see comments  Comments: PE  Diagnosis: Acute pulmonary embolism [facility]) [110315]  Admitting Physician: Hillary Bow [id number]  Attending Physician: Hillary Bow [[id number]]  PT Class (Do Not Modify): Observation [104]  PT Acc Code (Do Not Modify): Observation [10022]       Medical History Past Medical History:  Diagnosis Date  . Cardiomyopathy (HCC)   . Hypertension     Allergies Allergies  Allergen Reactions  . Shellfish Allergy Anaphylaxis    IV Location/Drains/Wounds Patient Lines/Drains/Airways Status   Active Line/Drains/Airways    Name:   Placement date:   Placement time:   Site:   Days:   Peripheral IV 03/27/18 Left Antecubital   03/27/18    1929    Antecubital   less than 1          Labs/Imaging Results for orders placed or performed during the hospital encounter of 03/27/18 (from the past 48 hour(s))  Basic metabolic panel     Status: None   Collection Time: 03/27/18  7:30 PM  Result Value Ref Range   Sodium 143 135 - 145 mmol/L   Potassium 4.0 3.5 - 5.1 mmol/L   Chloride 111 98 - 111 mmol/L   CO2 23 22 - 32 mmol/L   Glucose, Bld 97 70 - 99 mg/dL   BUN 16 6 - 20 mg/dL   Creatinine, Ser 5.92 0.44 - 1.00 mg/dL   Calcium 9.1 8.9 - 92.4 mg/dL   GFR calc non Af Amer >60 >60 mL/min   GFR calc Af Amer >60 >60 mL/min   Anion gap 9 5 - 15     Comment: Performed at [facility], [address] [place], [place] [address]  CBC     Status: Abnormal   Collection Time: 03/27/18  7:30 PM  Result Value Ref Range   WBC 7.6 4.0 - 10.5 K/uL   RBC 4.03 3.87 - 5.11 MIL/uL   Hemoglobin 11.3 (L) 12.0 - 15.0 g/dL   HCT 38.1 77.1 - 16.5 %   MCV 92.1 80.0 - 100.0 fL   MCH 28.0 26.0 - 34.0 pg   MCHC 30.5 30.0 - 36.0 g/dL   RDW 79.0 38.3 - 33.8 %   Platelets 300 150 - 400 K/uL   nRBC 0.0 0.0 - 0.2 %    Comment: Performed at [facility], [address] [place], [place] [address]  Brain natriuretic peptide     Status: None   Collection Time: 03/27/18  7:30 PM  Result Value Ref Range   B Natriuretic Peptide 25.8 0.0 - 100.0 pg/mL    Comment: Performed at [facility], [address], [place], [place] [address]  POCT i-Stat troponin I  Status: None   Collection Time: 03/27/18  7:36 PM  Result Value Ref Range   Troponin i, poc 0.00 0.00 - 0.08 ng/mL   Comment 3            Comment: Due to the release kinetics of cTnI, a negative result within the first hours of the onset of symptoms does not rule out myocardial infarction with certainty. If myocardial infarction is still suspected, repeat the test at appropriate intervals.   I-Stat beta hCG blood, ED     Status: Abnormal   Collection Time: 03/27/18  7:37 PM  Result Value Ref Range   I-stat hCG, quantitative 9.4 (H) <5 mIU/mL   Comment 3            Comment:   GEST. AGE      CONC.  (mIU/mL)   <=1 WEEK        5 - 50     2 WEEKS       50 - 500     3 WEEKS       100 - 10,000     4 WEEKS     1,000 - 30,000        FEMALE AND NON-PREGNANT FEMALE:     LESS THAN 5 mIU/mL   D-dimer, quantitative (not at [facility])     Status: Abnormal   Collection Time: 03/27/18  7:42 PM  Result Value Ref Range   D-Dimer, Quant 1.10 (H) 0.00 - 0.50 ug/mL-FEU    Comment: (NOTE) At the manufacturer cut-off of 0.50 ug/mL FEU, this assay has  been documented to exclude PE with a sensitivity and negative predictive value of 97 to 99%.  At this time, this assay has not been approved by the FDA to exclude DVT/VTE. Results should be correlated with clinical presentation. Performed at [facility], [address], [place], [place] [address]   Magnesium     Status: None   Collection Time: 03/27/18  7:42 PM  Result Value Ref Range   Magnesium 2.0 1.7 - 2.4 mg/dL    Comment: Performed at [facility], [address], [place], [place] [address]  hCG, quantitative, pregnancy     Status: Abnormal   Collection Time: 03/27/18  7:42 PM  Result Value Ref Range   hCG, Beta Chain, Quant, S 9 (H) <5 mIU/mL    Comment:          GEST. AGE      CONC.  (mIU/mL)   <=1 WEEK        5 - 50     2 WEEKS       50 - 500     3 WEEKS       100 - 10,000     4 WEEKS     1,000 - 30,000     5 WEEKS     3,500 - 115,000   6-8 WEEKS     12,000 - 270,000    12 WEEKS     15,000 - 220,000        FEMALE AND NON-PREGNANT FEMALE:     LESS THAN 5 mIU/mL Performed at [facility], [address], [place], [place] [address]   hCG, serum, qualitative     Status: Abnormal   Collection Time: 03/27/18  9:36 PM  Result Value Ref Range   Preg, Serum POSITIVE (A) NEGATIVE    Comment:        THE SENSITIVITY OF THIS METHODOLOGY  IS >10 mIU/mL. Performed at [facility], [address] [place], [place] [id number]   POCT i-Stat troponin I     Status: None   Collection Time: 03/27/18 11:33 PM  Result Value Ref Range   Troponin i, poc 0.01 0.00 - 0.08 ng/mL   Comment 3            Comment: Due to the release kinetics of cTnI, a negative result within the first hours of the onset of symptoms does not rule out myocardial infarction with certainty. If myocardial infarction is still suspected, repeat the test at appropriate intervals.    Dg Chest 2 View  Result Date: 03/27/2018 CLINICAL DATA:   Shortness of breath. EXAM: CHEST - 2 VIEW COMPARISON:  None. FINDINGS: The lungs are clear without focal pneumonia, edema, pneumothorax or pleural effusion. Cardiopericardial silhouette is at upper limits of normal for size. The visualized bony structures of the thorax are intact. Telemetry leads overlie the chest. IMPRESSION: No active cardiopulmonary disease. Electronically Signed   By: Kennith CenterEric  Mansell M.D.   On: 03/27/2018 19:52   Ct Angio Chest Pe W And/or Wo Contrast  Result Date: 03/27/2018 CLINICAL DATA:  43 year old with acute onset of mid chest pain and shortness of breath with exertion. Positive D-dimer. EXAM: CT ANGIOGRAPHY CHEST WITH CONTRAST TECHNIQUE: Multidetector CT imaging of the chest was performed using the standard protocol during bolus administration of intravenous contrast. Multiplanar CT image reconstructions and MIPs were obtained to evaluate the vascular anatomy. CONTRAST:  100mL ISOVUE-370 IOPAMIDOL INJECTION 76% IV. COMPARISON:  None. FINDINGS: Cardiovascular: Contrast opacification of the pulmonary arteries is very good. Filling defect within a segmental branch of the RIGHT UPPER LOBE pulmonary artery. No evidence of acute pulmonary embolism elsewhere in either lung. No evidence of RIGHT heart strain. Heart mildly enlarged with LEFT ventricular enlargement. No visible coronary atherosclerosis. No pericardial effusion. No visible atherosclerosis involving the thoracic or proximal abdominal aorta. No evidence of aortic aneurysm. Mediastinum/Nodes: No pathologically enlarged mediastinal, hilar or axillary lymph nodes. No mediastinal masses. Normal-appearing esophagus. Normal-appearing thyroid gland. Lungs/Pleura: Low lung volumes with mild atelectasis in the lower lobes. Lung parenchyma otherwise clear. No confluent airspace consolidation. No evidence of interstitial lung disease. No parenchymal nodules or masses. No pleural effusions. Central airways patent with mild bronchial wall  thickening. Upper Abdomen: Visualized upper abdomen unremarkable for the early arterial phase of enhancement. Musculoskeletal: Regional skeleton unremarkable without acute or significant osseous abnormality. Review of the MIP images confirms the above findings. IMPRESSION: 1. Acute pulmonary embolism involving a segmental branch of the RIGHT UPPER LOBE pulmonary artery. Clot burden is small as there is no evidence of embolism elsewhere in either lung. No evidence of RIGHT heart strain. 2. Low lung volumes with mild atelectasis in the lower lobes. No acute cardiopulmonary disease otherwise. I telephoned these critical/emergent results to Dr. Rush Landmarkegeler of the emergency department at the time of interpretation on 03/27/2018 at 9:48 p.m. Electronically Signed   By: Hulan Saashomas  Lawrence M.D.   On: 03/27/2018 21:49   EKG Interpretation  Date/Time:  Sunday March 27 2018 18:55:50 EST Ventricular Rate:  67 PR Interval:    QRS Duration: 96 QT Interval:  408 QTC Calculation: 431 R Axis:   3 Text Interpretation:  Sinus rhythm Left ventricular hypertrophy Nonspecific T abnormalities, anterior leads No prior ECG for comparison.  NO STEMI Confirmed by Theda Belfastegeler, Chris ([id number]) on 03/27/2018 7:20:13 PM   Pending Labs Unresulted Labs (From admission, onward)    Start  Ordered   03/28/18 0500  CBC  Tomorrow morning,   R     03/27/18 2243   03/28/18 0500  Basic metabolic panel  Tomorrow morning,   R     03/27/18 2243   03/28/18 0500  Heparin level (unfractionated)  Once-Timed,   R     03/27/18 2304   03/28/18 0500  CBC  Daily,   R     03/27/18 2306   03/27/18 2242  HIV antibody (Routine Testing)  Once,   R     03/27/18 2243          Vitals/Pain Today's Vitals   03/27/18 1856 03/27/18 1901 03/27/18 2100  BP: (!) 171/102  (!) 165/98  Pulse: 76  69  Resp: 16  15  Temp: 98.6 F (37 C)    TempSrc: Oral    SpO2: 99%  97%  Weight:  91.2 kg   Height:  5\' 2"  (1.575 m)     Isolation Precautions No  active isolations  Medications Medications  sodium chloride (PF) 0.9 % injection (has no administration in time range)  iopamidol (ISOVUE-370) 76 % injection (has no administration in time range)  acetaminophen (TYLENOL) tablet 650 mg (has no administration in time range)    Or  acetaminophen (TYLENOL) suppository 650 mg (has no administration in time range)  ondansetron (ZOFRAN) tablet 4 mg (has no administration in time range)    Or  ondansetron (ZOFRAN) injection 4 mg (has no administration in time range)  spironolactone (ALDACTONE) tablet 12.5 mg (has no administration in time range)  sacubitril-valsartan (ENTRESTO) 97-103 mg per tablet (has no administration in time range)  carvedilol (COREG) tablet 6.25 mg (has no administration in time range)  heparin bolus via infusion 2,000 Units (has no administration in time range)  heparin ADULT infusion 100 units/mL (25000 units/226mL sodium chloride 0.45%) (has no administration in time range)  iopamidol (ISOVUE-370) 76 % injection 100 mL (100 mLs Intravenous Contrast Given 03/27/18 2109)    Mobility walks

## 2018-03-28 ENCOUNTER — Encounter (HOSPITAL_COMMUNITY): Payer: Self-pay | Admitting: *Deleted

## 2018-03-28 ENCOUNTER — Other Ambulatory Visit: Payer: Self-pay

## 2018-03-28 ENCOUNTER — Observation Stay (HOSPITAL_BASED_OUTPATIENT_CLINIC_OR_DEPARTMENT_OTHER): Payer: Self-pay

## 2018-03-28 ENCOUNTER — Observation Stay (HOSPITAL_COMMUNITY): Payer: Self-pay

## 2018-03-28 DIAGNOSIS — I2699 Other pulmonary embolism without acute cor pulmonale: Secondary | ICD-10-CM

## 2018-03-28 LAB — RAPID URINE DRUG SCREEN, HOSP PERFORMED
Amphetamines: NOT DETECTED
Barbiturates: NOT DETECTED
Benzodiazepines: NOT DETECTED
Cocaine: NOT DETECTED
Opiates: NOT DETECTED
Tetrahydrocannabinol: NOT DETECTED

## 2018-03-28 LAB — CBC
HCT: 33.5 % — ABNORMAL LOW (ref 36.0–46.0)
Hemoglobin: 10.2 g/dL — ABNORMAL LOW (ref 12.0–15.0)
MCH: 28.2 pg (ref 26.0–34.0)
MCHC: 30.4 g/dL (ref 30.0–36.0)
MCV: 92.5 fL (ref 80.0–100.0)
Platelets: 250 10*3/uL (ref 150–400)
RBC: 3.62 MIL/uL — ABNORMAL LOW (ref 3.87–5.11)
RDW: 13.5 % (ref 11.5–15.5)
WBC: 7.1 10*3/uL (ref 4.0–10.5)
nRBC: 0 % (ref 0.0–0.2)

## 2018-03-28 LAB — BASIC METABOLIC PANEL
Anion gap: 7 (ref 5–15)
BUN: 12 mg/dL (ref 6–20)
CO2: 25 mmol/L (ref 22–32)
Calcium: 8.7 mg/dL — ABNORMAL LOW (ref 8.9–10.3)
Chloride: 110 mmol/L (ref 98–111)
Creatinine, Ser: 0.67 mg/dL (ref 0.44–1.00)
GFR calc Af Amer: >60 mL/min (ref >60)
GLUCOSE: 99 mg/dL (ref 70–99)
Potassium: 3.6 mmol/L (ref 3.5–5.1)
Sodium: 142 mmol/L (ref 135–145)

## 2018-03-28 LAB — HEPARIN LEVEL (UNFRACTIONATED)
HEPARIN UNFRACTIONATED: 0.59 [IU]/mL (ref 0.30–0.70)
Heparin Unfractionated: 0.81 IU/mL — ABNORMAL HIGH (ref 0.30–0.70)
Heparin Unfractionated: 0.84 IU/mL — ABNORMAL HIGH (ref 0.30–0.70)

## 2018-03-28 LAB — ECHOCARDIOGRAM COMPLETE
HEIGHTINCHES: 62 in
Weight: 3044.8 oz

## 2018-03-28 MED ORDER — INFLUENZA VAC SPLIT QUAD 0.5 ML IM SUSY: 0.5000 mL | PREFILLED_SYRINGE | INTRAMUSCULAR | Status: DC

## 2018-03-28 MED ORDER — HEPARIN (PORCINE) 25000 UT/250ML-% IV SOLN
1000.0000 [IU]/h | INTRAVENOUS | Status: DC
  Filled 2018-03-28: qty 250

## 2018-03-28 MED ORDER — OXYCODONE HCL 5 MG PO TABS
5.0000 mg | ORAL_TABLET | Freq: Four times a day (QID) | ORAL | Status: DC | PRN
  Administered 2018-03-28: 5 mg via ORAL
  Filled 2018-03-28: qty 1

## 2018-03-28 NOTE — Progress Notes (Signed)
PROGRESS NOTE    Alexandria Rhodes  QMG:[id number] DOB: [birth year] DOA: 03/27/2018 PCP: Patient, No Pcp Per  Outpatient Specialists:   Brief Narrative: Patient is a 43 year old African-American female, with past medical history significant for nonischemic cardiomyopathy with an EF of 10% in 2017, and query noncompliance with medication.  Patient was admitted with chest pain.  Work-up done was positive for pulmonary embolism (CTA chest revealed "Acute pulmonary embolism involving a segmental branch of the RIGHT UPPER LOBE pulmonary artery. Clot burden is small as there is no evidence of embolism elsewhere in either lung. No evidence of RIGHT heart strain").  Patient is currently on heparin drip.  Patient reported headache earlier today, not responding to Tylenol.  Will get a urine drug screening prior to commencing opiate medication.  Likely, patient will be discharged back on today tomorrow.  Assessment & Plan:   Principal Problem:   Acute pulmonary embolism (HCC) Active Problems:   Chronic systolic CHF (congestive heart failure) (HCC)   HTN (hypertension)  Acute pulmonary embolism:  Continue anticoagulation (heparin GTT) Likely discharge tomorrow DOAC Echocardiogram revealed normal EF (65 to 70%) Doppler vascular ultrasound was negative for DVT  Hypertension: Continue to optimize.  DVT prophylaxis: Heparin gtt Code Status: Full Family Communication:  Disposition Plan: Home  Consults called: None   Consultants:   None  Procedures:   Echocardiogram  Antimicrobials:   None   Subjective: No chest pain. No shortness of breath.  Objective: Vitals:   03/28/18 0521 03/28/18 0949 03/28/18 1423 03/28/18 1727  BP: (!) 164/100 135/82 128/87 (!) 155/95  Pulse: (!) 52 76 68 (!) 59  Resp: 17  12   Temp: 98.3 F (36.8 C)  98.1 F (36.7 C)   TempSrc: Oral  Oral   SpO2: 98% 100%    Weight:      Height:        Intake/Output Summary (Last 24 hours) at 03/28/2018 1807 Last  data filed at 03/28/2018 0600 Gross per 24 hour  Intake 87.02 ml  Output -  Net 87.02 ml   Filed Weights   03/27/18 1901 03/28/18 0015  Weight: 91.2 kg 86.3 kg    Examination:  General exam: Appears calm and comfortable  Respiratory system: Clear to auscultation.  Cardiovascular system: S1 & S2. Gastrointestinal system: Abdomen is nondistended, soft and nontender. No organomegaly or masses felt. Normal bowel sounds heard. Central nervous system: Alert and oriented. No focal neurological deficits. Extremities: No leg edema  Data Reviewed: I have personally reviewed following labs and imaging studies  CBC: Recent Labs  Lab 03/27/18 1930 03/28/18 0511  WBC 7.6 7.1  HGB 11.3* 10.2*  HCT 37.1 33.5*  MCV 92.1 92.5  PLT 300 250   Basic Metabolic Panel: Recent Labs  Lab 03/27/18 1930 03/27/18 1942 03/28/18 0511  NA 143  --  142  K 4.0  --  3.6  CL 111  --  110  CO2 23  --  25  GLUCOSE 97  --  99  BUN 16  --  12  CREATININE 0.74  --  0.67  CALCIUM 9.1  --  8.7*  MG  --  2.0  --    GFR: Estimated Creatinine Clearance: 92.5 mL/min (by C-G formula based on SCr of 0.67 mg/dL). Liver Function Tests: No results for input(s): AST, ALT, ALKPHOS, BILITOT, PROT, ALBUMIN in the last 168 hours. No results for input(s): LIPASE, AMYLASE in the last 168 hours. No results for input(s): AMMONIA in the last 168 hours.  Coagulation Profile: No results for input(s): INR, PROTIME in the last 168 hours. Cardiac Enzymes: No results for input(s): CKTOTAL, CKMB, CKMBINDEX, TROPONINI in the last 168 hours. BNP (last 3 results) No results for input(s): PROBNP in the last 8760 hours. HbA1C: No results for input(s): HGBA1C in the last 72 hours. CBG: No results for input(s): GLUCAP in the last 168 hours. Lipid Profile: No results for input(s): CHOL, HDL, LDLCALC, TRIG, CHOLHDL, LDLDIRECT in the last 72 hours. Thyroid Function Tests: No results for input(s): TSH, T4TOTAL, FREET4, T3FREE,  THYROIDAB in the last 72 hours. Anemia Panel: No results for input(s): VITAMINB12, FOLATE, FERRITIN, TIBC, IRON, RETICCTPCT in the last 72 hours. Urine analysis: No results found for: COLORURINE, APPEARANCEUR, LABSPEC, PHURINE, GLUCOSEU, HGBUR, BILIRUBINUR, KETONESUR, PROTEINUR, UROBILINOGEN, NITRITE, LEUKOCYTESUR Sepsis Labs: @LABRCNTIP (procalcitonin:4,lacticidven:4)  )No results found for this or any previous visit (from the past 240 hour(s)).       Radiology Studies: Dg Chest 2 View  Result Date: 03/27/2018 CLINICAL DATA:  Shortness of breath. EXAM: CHEST - 2 VIEW COMPARISON:  None. FINDINGS: The lungs are clear without focal pneumonia, edema, pneumothorax or pleural effusion. Cardiopericardial silhouette is at upper limits of normal for size. The visualized bony structures of the thorax are intact. Telemetry leads overlie the chest. IMPRESSION: No active cardiopulmonary disease. Electronically Signed   By: Kennith Center M.D.   On: 03/27/2018 19:52   Ct Angio Chest Pe W And/or Wo Contrast  Result Date: 03/27/2018 CLINICAL DATA:  42 year old with acute onset of mid chest pain and shortness of breath with exertion. Positive D-dimer. EXAM: CT ANGIOGRAPHY CHEST WITH CONTRAST TECHNIQUE: Multidetector CT imaging of the chest was performed using the standard protocol during bolus administration of intravenous contrast. Multiplanar CT image reconstructions and MIPs were obtained to evaluate the vascular anatomy. CONTRAST:  ISOVUE-370 IOPAMIDOL INJECTION 76% IV. COMPARISON:  None. FINDINGS: Cardiovascular: Contrast opacification of the pulmonary arteries is very good. Filling defect within a segmental branch of the RIGHT UPPER LOBE pulmonary artery. No evidence of acute pulmonary embolism elsewhere in either lung. No evidence of RIGHT heart strain. Heart mildly enlarged with LEFT ventricular enlargement. No visible coronary atherosclerosis. No pericardial effusion. No visible atherosclerosis  involving the thoracic or proximal abdominal aorta. No evidence of aortic aneurysm. Mediastinum/Nodes: No pathologically enlarged mediastinal, hilar or axillary lymph nodes. No mediastinal masses. Normal-appearing esophagus. Normal-appearing thyroid gland. Lungs/Pleura: Low lung volumes with mild atelectasis in the lower lobes. Lung parenchyma otherwise clear. No confluent airspace consolidation. No evidence of interstitial lung disease. No parenchymal nodules or masses. No pleural effusions. Central airways patent with mild bronchial wall thickening. Upper Abdomen: Visualized upper abdomen unremarkable for the early arterial phase of enhancement. Musculoskeletal: Regional skeleton unremarkable without acute or significant osseous abnormality. Review of the MIP images confirms the above findings. IMPRESSION: 1. Acute pulmonary embolism involving a segmental branch of the RIGHT UPPER LOBE pulmonary artery. Clot burden is small as there is no evidence of embolism elsewhere in either lung. No evidence of RIGHT heart strain. 2. Low lung volumes with mild atelectasis in the lower lobes. No acute cardiopulmonary disease otherwise. I telephoned these critical/emergent results to Dr. Rush Landmark of the emergency department at the time of interpretation on 03/27/2018 at 9:48 p.m. Electronically Signed   By: Hulan Saas M.D.   On: 03/27/2018 21:49   Vas [place] Lower Extremity Venous (dvt)  Result Date: 03/28/2018  Lower Venous Study Indications: Pulmonary embolism.  Examination Guidelines: A complete evaluation includes B-mode imaging, spectral Doppler,  color Doppler, and power Doppler as needed of all accessible portions of each vessel. Bilateral testing is considered an integral part of a complete examination. Limited examinations for reoccurring indications may be performed as noted.  Right Venous Findings: +---------+---------------+---------+-----------+----------+-------+           CompressibilityPhasicitySpontaneityPropertiesSummary +---------+---------------+---------+-----------+----------+-------+ CFV      Full           Yes      Yes                          +---------+---------------+---------+-----------+----------+-------+ SFJ      Full                                                 +---------+---------------+---------+-----------+----------+-------+ FV Prox  Full                                                 +---------+---------------+---------+-----------+----------+-------+ FV Mid   Full                                                 +---------+---------------+---------+-----------+----------+-------+ FV DistalFull                                                 +---------+---------------+---------+-----------+----------+-------+ PFV      Full                                                 +---------+---------------+---------+-----------+----------+-------+ POP      Full           Yes      Yes                          +---------+---------------+---------+-----------+----------+-------+ PTV      Full                                                 +---------+---------------+---------+-----------+----------+-------+ PERO     Full                                                 +---------+---------------+---------+-----------+----------+-------+  Left Venous Findings: +---------+---------------+---------+-----------+----------+-------+          CompressibilityPhasicitySpontaneityPropertiesSummary +---------+---------------+---------+-----------+----------+-------+ CFV      Full           Yes      Yes                          +---------+---------------+---------+-----------+----------+-------+ SFJ  Full                                                 +---------+---------------+---------+-----------+----------+-------+ FV Prox  Full                                                  +---------+---------------+---------+-----------+----------+-------+ FV Mid   Full                                                 +---------+---------------+---------+-----------+----------+-------+ FV DistalFull                                                 +---------+---------------+---------+-----------+----------+-------+ PFV      Full                                                 +---------+---------------+---------+-----------+----------+-------+ POP      Full           Yes      Yes                          +---------+---------------+---------+-----------+----------+-------+ PTV      Full                                                 +---------+---------------+---------+-----------+----------+-------+ PERO     Full                                                 +---------+---------------+---------+-----------+----------+-------+    Summary: Right: There is no evidence of deep vein thrombosis in the lower extremity. No cystic structure found in the popliteal fossa. Left: There is no evidence of deep vein thrombosis in the lower extremity. No cystic structure found in the popliteal fossa.  *See table(s) above for measurements and observations. Electronically signed by Waverly Ferrarihristopher Dickson MD on 03/28/2018 at 5:11:20 PM.    Final         Scheduled Meds: . carvedilol  6.25 mg Oral BID WC  . [START ON 03/29/2018] Influenza vac split quadrivalent PF  0.5 mL Intramuscular Tomorrow-1000  . sacubitril-valsartan  1 tablet Oral BID  . spironolactone  12.5 mg Oral Daily   Continuous Infusions: . heparin 1,200 Units/hr (03/28/18 1300)     LOS: 0 days    Time spent: 25 minutes.    Berton MountSylvester Garik Diamant, MD  [facility] Pager #: [phone] 7PM-7AM contact night coverage as above

## 2018-03-28 NOTE — Progress Notes (Signed)
Patient complains of worsening headache. Pt reports she does not usually have this symptom. Provider contacted to assist. Tylenol was given prior without resolve.

## 2018-03-28 NOTE — Progress Notes (Signed)
ANTICOAGULATION CONSULT NOTE   Pharmacy Consult for heparin Indication: acute pulmonary embolism  Allergies  Allergen Reactions  . Shellfish Allergy Anaphylaxis    Patient Measurements: Height: 5\' 2"  (157.5 cm) Weight: 190 lb 4.8 oz (86.3 kg) IBW/kg (Calculated) : 50.1 Heparin Dosing Weight: 69.7 kg  Vital Signs: Temp: 98.3 F (36.8 C) (01/13 0521) Temp Source: Oral (01/13 0521) BP: 135/82 (01/13 0949) Pulse Rate: 76 (01/13 0949)  Labs: Recent Labs    03/27/18 1930 03/28/18 0511 03/28/18 1103  HGB 11.3* 10.2*  --   HCT 37.1 33.5*  --   PLT 300 250  --   HEPARINUNFRC  --  0.59 0.81*  CREATININE 0.74 0.67  --     Estimated Creatinine Clearance: 92.5 mL/min (by C-G formula based on SCr of 0.67 mg/dL).   Assessment: 16 y/oF presented [facility] ED on 03/27/2018  with c/o CP/tightness/DOE. D-dimer elevated at 1.10. CTa chest showed acute PE involving a segmental branch of the right upper lobe pulmonary artery (small clot burden). Pharmacy consulted to dose heparin infusion for acute PE. Patient not on any anticoagulants PTA.   Today, 03/28/18:   1103 heparin level = 0.81 units/mL, supratherapeutic on heparin infusion at 1300 units/hr   CBC: Hgb decreased to 10.2, Pltc WNL  No bleeding or complications of therapy noted per nursing  Goal of Therapy:  Heparin level 0.3-0.7 units/ml Monitor platelets by anticoagulation protocol: Yes   Plan:  - Decrease heparin infusion to 1200 units/hr - Check heparin level 6 hours after rate change - Daily CBC and heparin level  - Monitor closely for s/sx of bleeding   Greer Pickerel, PharmD, BCPS Pager: [phone] 03/28/2018 12:48 PM

## 2018-03-28 NOTE — Progress Notes (Signed)
Bilateral lower extremity venous duplex has been completed. Preliminary results can be found in [facility] through chart review.   03/28/18 10:55 AM Olen Cordial RVT

## 2018-03-28 NOTE — Progress Notes (Signed)
Echocardiogram 2D Echocardiogram has been performed.  03/28/2018 10:29 AM Gertie Fey, MHA, RVT, RDCS, RDMS

## 2018-03-28 NOTE — Progress Notes (Signed)
Brief Pharmacy Note re: IV heparin  O: Heparin level 0.84 on 1200 units/hr (goal 0.3-0.7)      No bleeding or infusion related issues per nursing  A/P:  Decrease heparin infusion to 1000 units/hr          Recheck 6h heparin level           F/U plans to transition to DOAC  Junita Push, PharmD, BCPS 03/28/2018@8 :22 PM

## 2018-03-29 DIAGNOSIS — I2699 Other pulmonary embolism without acute cor pulmonale: Principal | ICD-10-CM

## 2018-03-29 DIAGNOSIS — I10 Essential (primary) hypertension: Secondary | ICD-10-CM

## 2018-03-29 LAB — HEPARIN LEVEL (UNFRACTIONATED)
Heparin Unfractionated: 0.55 IU/mL (ref 0.30–0.70)
Heparin Unfractionated: 0.34 IU/mL (ref 0.30–0.70)

## 2018-03-29 LAB — CBC
HCT: 38.7 % (ref 36.0–46.0)
Hemoglobin: 11.5 g/dL — ABNORMAL LOW (ref 12.0–15.0)
MCH: 28.1 pg (ref 26.0–34.0)
MCHC: 29.7 g/dL — ABNORMAL LOW (ref 30.0–36.0)
MCV: 94.6 fL (ref 80.0–100.0)
Platelets: 281 10*3/uL (ref 150–400)
RBC: 4.09 MIL/uL (ref 3.87–5.11)
RDW: 13.3 % (ref 11.5–15.5)
WBC: 6.5 10*3/uL (ref 4.0–10.5)
nRBC: 0 % (ref 0.0–0.2)

## 2018-03-29 LAB — HIV ANTIBODY (ROUTINE TESTING W REFLEX): HIV Screen 4th Generation wRfx: NONREACTIVE

## 2018-03-29 MED ORDER — APIXABAN 5 MG PO TABS
10.0000 mg | ORAL_TABLET | ORAL | Status: AC
  Administered 2018-03-29: 10 mg via ORAL
  Filled 2018-03-29: qty 2

## 2018-03-29 MED ORDER — ELIQUIS 5 MG VTE STARTER PACK: ORAL_TABLET | ORAL | 0 refills | Status: DC

## 2018-03-29 MED ORDER — APIXABAN 5 MG PO TABS: 10.0000 mg | ORAL_TABLET | ORAL | 0 refills | Status: DC

## 2018-03-29 NOTE — Progress Notes (Signed)
Patient reports that Apixaban starter pack is not available.Call placed to [facility] [phone] to follow up on pt.'s behalf. Spoke to representative Ethelene Browns whom stated two mg tablets were available for pick up and starter pack would be available tomorrow.

## 2018-03-29 NOTE — Progress Notes (Signed)
ANTICOAGULATION CONSULT NOTE - Follow Up Consult  Pharmacy Consult for Heparin Indication: pulmonary embolus  Allergies  Allergen Reactions  . Shellfish Allergy Anaphylaxis    Patient Measurements: Height: 5\' 2"  (157.5 cm) Weight: 190 lb 4.8 oz (86.3 kg) IBW/kg (Calculated) : 50.1 Heparin Dosing Weight:   Vital Signs: Temp: 98.4 F (36.9 C) (01/13 2152) Temp Source: Oral (01/13 2152) BP: 150/90 (01/13 2152) Pulse Rate: 63 (01/13 2152)  Labs: Recent Labs    03/27/18 1930  03/28/18 0511 03/28/18 1103 03/28/18 1912 03/29/18 0306  HGB 11.3*  --  10.2*  --   --  11.5*  HCT 37.1  --  33.5*  --   --  38.7  PLT 300  --  250  --   --  281  HEPARINUNFRC  --    < > 0.59 0.81* 0.84* 0.55  CREATININE 0.74  --  0.67  --   --   --    < > = values in this interval not displayed.    Estimated Creatinine Clearance: 92.5 mL/min (by C-G formula based on SCr of 0.67 mg/dL).   Medications:  Infusions:  . heparin 1,000 Units/hr (03/28/18 2036)    Assessment: Patient with heparin level at goal.  No heparin issues noted.  Goal of Therapy:  Heparin level 0.3-0.7 units/ml Monitor platelets by anticoagulation protocol: Yes   Plan:  Continue heparin drip at current rate Recheck level at 1200  Darlina Guys, [place] 03/29/2018,5:10 AM

## 2018-03-29 NOTE — Care Management Note (Signed)
Case Management Note  Patient Details  Name: Alexandria Rhodes MRN: [id number] Date of Birth: Mar 26, 1975  Subjective/Objective:                    Action/Plan:Pt admitted with Acute Pulmonary Embolism   Expected Discharge Date:  03/29/18               Expected Discharge Plan:  Home/Self Care  In-House Referral:     Discharge planning Services  CM Consult, Follow-up appt scheduled, [facility]  Post Acute Care Choice:    Choice offered to:     DME Arranged:    DME Agency:     HH Arranged:    HH Agency:     Status of Service:  Completed, signed off  If discussed at [employer] of Stay Meetings, dates discussed:    Additional CommentsGeni Bers, RN 03/29/2018, 2:14 PM

## 2018-03-29 NOTE — Progress Notes (Signed)
ANTICOAGULATION CONSULT NOTE   Pharmacy Consult for heparin Indication: acute pulmonary embolism  Allergies  Allergen Reactions  . Shellfish Allergy Anaphylaxis    Patient Measurements: Height: 5\' 2"  (157.5 cm) Weight: 190 lb 4.8 oz (86.3 kg) IBW/kg (Calculated) : 50.1 Heparin Dosing Weight: 69.7 kg  Vital Signs: Temp: 97.7 F (36.5 C) (01/14 0548) Temp Source: Oral (01/14 0548) BP: 135/84 (01/14 0936) Pulse Rate: 64 (01/14 0936)  Labs: Recent Labs    03/27/18 1930 03/28/18 0511  03/28/18 1912 03/29/18 0306 03/29/18 1157  HGB 11.3* 10.2*  --   --  11.5*  --   HCT 37.1 33.5*  --   --  38.7  --   PLT 300 250  --   --  281  --   HEPARINUNFRC  --  0.59   < > 0.84* 0.55 0.34  CREATININE 0.74 0.67  --   --   --   --    < > = values in this interval not displayed.    Estimated Creatinine Clearance: 92.5 mL/min (by C-G formula based on SCr of 0.67 mg/dL).   Assessment: 36 y/oF presented [facility] ED on 03/27/2018  with c/o CP/tightness/DOE. D-dimer elevated at 1.10. CTa chest showed acute PE involving a segmental branch of the right upper lobe pulmonary artery (small clot burden). Pharmacy consulted to dose heparin infusion for acute PE. Patient not on any anticoagulants PTA.   Today, 03/29/18:   1157 heparin level = 0.34 units/mL, therapeutic on heparin infusion at 1000 units/hr   CBC: Hgb stable at 11.5, Pltc WNL  No bleeding or complications of therapy noted per nursing  Goal of Therapy:  Heparin level 0.3-0.7 units/ml Monitor platelets by anticoagulation protocol: Yes   Plan:  - Continue heparin infusion at 1000 units/hr - Check heparin level at 6pm to ensure level remains within therapeutic range  - Daily CBC and heparin level  - Monitor closely for s/sx of bleeding - F/u plans for transition to oral anticoagulation   Greer Pickerel, PharmD, BCPS Pager: [phone] 03/29/2018 1:29 PM

## 2018-03-29 NOTE — Progress Notes (Signed)
Received report from [place], [place]. I agree with previous assessment. Pt resting comfortably. Will continue to monitor closely

## 2018-03-29 NOTE — Discharge Instructions (Signed)
Information on my medicine - ELIQUIS (apixaban)  Why was Eliquis prescribed for you? Eliquis was prescribed to treat blood clots that may have been found in the veins of your legs (deep vein thrombosis) or in your lungs (pulmonary embolism) and to reduce the risk of them occurring again.  What do You need to know about Eliquis ? The starting dose is 10 mg (two 5 mg tablets) taken TWICE daily for the FIRST SEVEN (7) DAYS, then on DAY 8 (04/05/18), the dose is reduced to ONE 5 mg tablet taken TWICE daily.  Eliquis may be taken with or without food.   Try to take the dose about the same time in the morning and in the evening. If you have difficulty swallowing the tablet whole please discuss with your pharmacist how to take the medication safely.  Take Eliquis exactly as prescribed and DO NOT stop taking Eliquis without talking to the doctor who prescribed the medication.  Stopping may increase your risk of developing a new blood clot.  Refill your prescription before you run out.  After discharge, you should have regular check-up appointments with your healthcare provider that is prescribing your Eliquis.    What do you do if you miss a dose? If a dose of ELIQUIS is not taken at the scheduled time, take it as soon as possible on the same day and twice-daily administration should be resumed. The dose should not be doubled to make up for a missed dose.  Important Safety Information A possible side effect of Eliquis is bleeding. You should call your healthcare provider right away if you experience any of the following: ? Bleeding from an injury or your nose that does not stop. ? Unusual colored urine (red or dark brown) or unusual colored stools (red or black). ? Unusual bruising for unknown reasons. ? A serious fall or if you hit your head (even if there is no bleeding).  Some medicines may interact with Eliquis and might increase your risk of bleeding or clotting while on Eliquis. To  help avoid this, consult your healthcare provider or pharmacist prior to using any new prescription or non-prescription medications, including herbals, vitamins, non-steroidal anti-inflammatory drugs (NSAIDs) and supplements.  This website has more information on Eliquis (apixaban): http://www.eliquis.com/eliquis/home

## 2018-03-29 NOTE — Discharge Summary (Signed)
Physician Discharge Summary  Patient ID: Alexandria Rhodes MRN: [id number] DOB/AGE: November 11, 1975 43 y.o.  Admit date: 03/27/2018 Discharge date: 03/29/2018  Admission Diagnoses:  Discharge Diagnoses:  Principal Problem:   Acute pulmonary embolism (HCC) Active Problems:   Chronic systolic CHF (congestive heart failure) (HCC)   HTN (hypertension)   Discharged Condition: stable  Hospital Course: Patient is a 43 year old African-American female, with past medical history significant for nonischemic cardiomyopathy with an EF of 10% in 2017, and query noncompliance with medication.  Patient was admitted with chest pain.  Work-up done was positive for pulmonary embolism (CTA chest revealed "Acute pulmonary embolism involving a segmental branch of the RIGHT UPPER LOBE pulmonary artery. Clot burden is small as there is no evidence of embolism elsewhere in either lung. No evidence of RIGHT heart strain").  Patient was managed with heparin drip during the hospital stay.  Patient will be transitioned to Eliquis on discharge.  Patient has remained chest pain-free.  Patient will follow with a primary care provider on discharge.    Consults: None  Significant Diagnostic Studies:  CTA of the chest revealed "Acute pulmonary embolism involving a segmental branch of the RIGHT UPPER LOBE pulmonary artery. Clot burden is small as there is no evidence of embolism elsewhere in either lung. No evidence of RIGHT heart strain.  Low lung volumes with mild atelectasis in the lower lobes. No acute cardiopulmonary disease otherwise".  Treatments: Patient was treated with heparin drip during the hospital stay.  Patient will be discharged back home on apixaban 10 mg p.o. twice daily for the next 7 days, and then decrease to 5 mg p.o. twice daily.  Patient will need at least 3 to 6 months of treatment, as this is the first episode of likely thromboembolic event.  Patient will follow with her primary care provider within 1 week of  discharge.  Discharge Exam: Blood pressure 135/84, pulse 64, temperature 97.7 F (36.5 C), temperature source Oral, resp. rate 17, height 5\' 2"  (1.575 m), weight 86.3 kg, last menstrual period 03/27/2014, SpO2 94 %.   Disposition: Discharge disposition: 01-Home or Self Care   Discharge Instructions    Diet - low sodium heart healthy   Complete by:  As directed    Increase activity slowly   Complete by:  As directed      Allergies as of 03/29/2018      Reactions   Shellfish Allergy Anaphylaxis      Medication List    STOP taking these medications   acetaminophen 325 MG tablet Commonly known as:  TYLENOL     TAKE these medications   carvedilol 6.25 MG tablet Commonly known as:  COREG Take 6.25 mg by mouth 2 (two) times daily with a meal.   ELIQUIS STARTER PACK 5 MG Tabs Take as directed on package: start with two-5mg  tablets twice daily for 7 days. On day 8, switch to one-5mg  tablet twice daily.   ENTRESTO 97-103 MG Generic drug:  sacubitril-valsartan Take 1 tablet by mouth 2 (two) times daily.   spironolactone 25 MG tablet Commonly known as:  ALDACTONE Take 12.5 mg by mouth daily.   SUMAtriptan 50 MG tablet Commonly known as:  IMITREX Take 50 mg by mouth every 2 (two) hours as needed for migraine. May repeat in 2 hours if headache persists or recurs.        SignedBarnetta Chapel 03/29/2018, 1:57 PM

## 2018-03-29 NOTE — Progress Notes (Signed)
Patient inquired about work restrictions. Work note requested.Pt works two jobs. Provider updated,contacted and stated that patient will have no restrictions & may return to normal activity.

## 2018-04-06 ENCOUNTER — Ambulatory Visit (INDEPENDENT_AMBULATORY_CARE_PROVIDER_SITE_OTHER): Payer: Self-pay | Admitting: Family Medicine

## 2018-04-06 ENCOUNTER — Encounter: Payer: Self-pay | Admitting: Family Medicine

## 2018-04-06 VITALS — BP 120/84 | HR 74 | Temp 98.1°F | Ht 62.0 in | Wt 181.0 lb

## 2018-04-06 DIAGNOSIS — R319 Hematuria, unspecified: Secondary | ICD-10-CM

## 2018-04-06 DIAGNOSIS — R6889 Other general symptoms and signs: Secondary | ICD-10-CM

## 2018-04-06 DIAGNOSIS — Z131 Encounter for screening for diabetes mellitus: Secondary | ICD-10-CM

## 2018-04-06 DIAGNOSIS — Z7689 Persons encountering health services in other specified circumstances: Secondary | ICD-10-CM

## 2018-04-06 DIAGNOSIS — I2699 Other pulmonary embolism without acute cor pulmonale: Secondary | ICD-10-CM

## 2018-04-06 DIAGNOSIS — Z09 Encounter for follow-up examination after completed treatment for conditions other than malignant neoplasm: Secondary | ICD-10-CM

## 2018-04-06 DIAGNOSIS — I5022 Chronic systolic (congestive) heart failure: Secondary | ICD-10-CM

## 2018-04-06 DIAGNOSIS — R059 Cough, unspecified: Secondary | ICD-10-CM | POA: Insufficient documentation

## 2018-04-06 DIAGNOSIS — R05 Cough: Secondary | ICD-10-CM

## 2018-04-06 DIAGNOSIS — N390 Urinary tract infection, site not specified: Secondary | ICD-10-CM | POA: Insufficient documentation

## 2018-04-06 LAB — POCT GLYCOSYLATED HEMOGLOBIN (HGB A1C): Hemoglobin A1C: 5.4 % (ref 4.0–5.6)

## 2018-04-06 LAB — POCT INFLUENZA A/B
Influenza A, POC: NEGATIVE
Influenza B, POC: NEGATIVE

## 2018-04-06 LAB — POCT URINALYSIS DIP (MANUAL ENTRY)
Glucose, UA: NEGATIVE mg/dL
Ketones, POC UA: NEGATIVE mg/dL
Leukocytes, UA: NEGATIVE
Nitrite, UA: POSITIVE — AB
Protein Ur, POC: 30 mg/dL — AB
Spec Grav, UA: 1.025 (ref 1.010–1.025)
Urobilinogen, UA: 1 E.U./dL
pH, UA: 6 (ref 5.0–8.0)

## 2018-04-06 MED ORDER — BENZONATATE 100 MG PO CAPS: 100.0000 mg | ORAL_CAPSULE | Freq: Two times a day (BID) | ORAL | 1 refills | Status: DC | PRN

## 2018-04-06 MED ORDER — APIXABAN 5 MG PO TABS: 5.0000 mg | ORAL_TABLET | Freq: Two times a day (BID) | ORAL | 3 refills | Status: DC

## 2018-04-06 MED ORDER — ONDANSETRON HCL 4 MG PO TABS: 4.0000 mg | ORAL_TABLET | Freq: Three times a day (TID) | ORAL | 1 refills | Status: DC | PRN

## 2018-04-06 MED ORDER — SULFAMETHOXAZOLE-TRIMETHOPRIM 800-160 MG PO TABS: 1.0000 | ORAL_TABLET | Freq: Two times a day (BID) | ORAL | 0 refills | Status: DC

## 2018-04-06 MED FILL — SULFAMETHOXAZOLE-TMP DS TAB: 800-160 | 7 days supply | Qty: 14 | Fill #0

## 2018-04-06 MED FILL — ONDANSETRON HCL 4 MG TABLET: 4 | 6 days supply | Qty: 20 | Fill #0

## 2018-04-06 MED FILL — BENZONATATE 100 MG CAP: 100 | 10 days supply | Qty: 20 | Fill #0

## 2018-04-06 NOTE — Progress Notes (Signed)
New Patient--Establish Care--Hospital Follow Up Office Visit  Subjective:  Patient ID: Alexandria Rhodes, female    DOB: 12-21-1975  Age: 43 y.o. MRN: [id number]  CC:  Chief Complaint  Patient presents with  . Establish Care    HPI Alexandria Rhodes is a 43 year old female who presents for hospital follow up and to establish care.   Past Medical History:  Diagnosis Date  . Acute pulmonary embolism (HCC)   . Cardiomyopathy (HCC)   . CHF (congestive heart failure) (HCC)   . Hypertension    Current Status: Since her last hospital admission for Acute PE, which she was hospitalized from 03/27/2018-03/29/2018. She is doing well with no complaints. She denies chest pain, chest pressure, heart palpitations, cough and shortness of breath reported. She received Influenza vaccine while hospitalized, but she has been having increased productive, cough, occasional vomiting, swollen glands, and increase fatigue. She has taken Theraflu with some relief.   She reports fatigue and occasional chills. She denies fevers, recent infections, weight loss, and night sweats. She has not had any headaches, visual changes, dizziness, and falls. No reports of GI problems such as nausea, vomiting, diarrhea, and constipation. She has no reports of blood in stools, dysuria and hematuria. No depression or anxiety reported.   No past surgical history on file.  No family history on file.  Social History   Socioeconomic History  . Marital status: Single    Spouse name: Not on file  . Number of children: Not on file  . Years of education: Not on file  . Highest education level: Not on file  Occupational History  . Not on file  Social Needs  . Financial resource strain: Not hard at all  . Food insecurity:    Worry: Patient refused    Inability: Patient refused  . Transportation needs:    Medical: Patient refused    Non-medical: Patient refused  Tobacco Use  . Smoking status: Never Smoker  . Smokeless tobacco:  Never Used  Substance and Sexual Activity  . Alcohol use: Not Currently  . Drug use: Not Currently  . Sexual activity: Yes  Lifestyle  . Physical activity:    Days per week: Patient refused    Minutes per session: Patient refused  . Stress: Not at all  Relationships  . Social connections:    Talks on phone: Patient refused    Gets together: Patient refused    Attends religious service: Patient refused    Active member of club or organization: Patient refused    Attends meetings of clubs or organizations: Patient refused    Relationship status: Patient refused  . Intimate partner violence:    Fear of current or ex partner: Patient refused    Emotionally abused: Patient refused    Physically abused: Patient refused    Forced sexual activity: Patient refused  Other Topics Concern  . Not on file  Social History Narrative  . Not on file    ROS Review of Systems  Constitutional: Positive for appetite change (Decrease), chills and fatigue.  Eyes: Negative.   Respiratory: Positive for cough (Frequent).   Cardiovascular: Negative.   Gastrointestinal: Positive for nausea (Occasional) and vomiting (Occasional).  Endocrine: Negative.   Musculoskeletal: Positive for arthralgias (Generalized body aches).  Skin: Negative.   Allergic/Immunologic: Negative.   Neurological: Positive for dizziness, light-headedness and headaches.  Hematological: Negative.   Psychiatric/Behavioral: Negative.    Objective:   Today's Vitals: BP 120/84 (BP Location: Left Arm, Patient Position: Sitting,  Cuff Size: Large)   Pulse 74   Temp 98.1 F (36.7 C) (Oral)   Ht 5\' 2"  (1.575 m)   Wt 181 lb (82.1 kg)   LMP 03/27/2014 (Approximate)   SpO2 100%   BMI 33.11 kg/m   Physical Exam Vitals signs and nursing note reviewed.  Constitutional:      Appearance: Normal appearance.  HENT:     Head: Normocephalic and atraumatic.     Right Ear: External ear normal.     Left Ear: External ear normal.      Nose: Nose normal.     Mouth/Throat:     Mouth: Mucous membranes are moist.  Eyes:     Conjunctiva/sclera: Conjunctivae normal.  Neck:     Musculoskeletal: Normal range of motion and neck supple.  Cardiovascular:     Rate and Rhythm: Normal rate and regular rhythm.     Pulses: Normal pulses.     Heart sounds: Normal heart sounds.  Pulmonary:     Effort: Pulmonary effort is normal.     Breath sounds: Normal breath sounds.  Abdominal:     General: Bowel sounds are normal.     Palpations: Abdomen is soft.  Musculoskeletal: Normal range of motion.  Skin:    General: Skin is warm and dry.     Capillary Refill: Capillary refill takes less than 2 seconds.  Neurological:     General: No focal deficit present.     Mental Status: She is alert and oriented to person, place, and time.  Psychiatric:        Mood and Affect: Mood normal.        Behavior: Behavior normal.        Thought Content: Thought content normal.        Judgment: Judgment normal.    Assessment & Plan:   1. Hospital discharge follow-up  2. Encounter to establish care  3. Other acute pulmonary embolism without acute cor pulmonale (HCC) Stable. She denies sudden onset of dyspnea and coughing. Denies Productive frothy, pink-tinged sputum. Denies tachycardia, pallor, feelings of impending doom. Denies symptoms of DVT, history of A-fib, any recent surgeries, pregnancy, long-bone fractures, and prolonged inactivity (sedentary, long distant traveling). Continue Eliquis as prescribed.   4. Chronic systolic CHF (congestive heart failure) (HCC) - apixaban (ELIQUIS) 5 MG TABS tablet; Take 1 tablet (5 mg total) by mouth 2 (two) times daily.  Dispense: 60 tablet; Refill: 3  5. Urinary tract infection with hematuria, site unspecified We will initiate Septra today. - sulfamethoxazole-trimethoprim (BACTRIM DS,SEPTRA DS) 800-160 MG tablet; Take 1 tablet by mouth 2 (two) times daily.  Dispense: 14 tablet; Refill: 0  6. Cough We  will initiate Benzonatate today. - benzonatate (TESSALON) 100 MG capsule; Take 1 capsule (100 mg total) by mouth 2 (two) times daily as needed for cough.  Dispense: 20 capsule; Refill: 1  7. Flu-like symptoms Continue TheraFlu as directed. Increase fluids. Use humidifier and throat lozenges as needed.   8. Screening for diabetes mellitus Hgb A1c is stable at 5.4 today. She will continue to decrease foods/beverages high in sugars and carbs and follow Heart Healthy or DASH diet. Increase physical activity to at least 30 minutes cardio exercise daily.  - POCT glycosylated hemoglobin (Hb A1C) - POCT urinalysis dipstick  9. Follow up We will draw labs at next office visit. Physician's excuse provided today for patient to present to her employer.   Problem List Items Addressed This Visit      Cardiovascular and  Mediastinum   Acute pulmonary embolism (HCC)   Relevant Medications   apixaban (ELIQUIS) 5 MG TABS tablet   Chronic systolic CHF (congestive heart failure) (HCC) (Chronic)   Relevant Medications   apixaban (ELIQUIS) 5 MG TABS tablet    Other Visit Diagnoses    Hospital discharge follow-up    -  Primary   Encounter to establish care       Urinary tract infection with hematuria, site unspecified       Relevant Medications   sulfamethoxazole-trimethoprim (BACTRIM DS,SEPTRA DS) 800-160 MG tablet   Cough       Relevant Medications   benzonatate (TESSALON) 100 MG capsule   Flu-like symptoms       Screening for diabetes mellitus       Relevant Orders   POCT glycosylated hemoglobin (Hb A1C) (Completed)   POCT urinalysis dipstick (Completed)   Follow up          Outpatient Encounter Medications as of 04/06/2018  Medication Sig  . carvedilol (COREG) 6.25 MG tablet Take 6.25 mg by mouth 2 (two) times daily with a meal.  . sacubitril-valsartan (ENTRESTO) 97-103 MG Take 1 tablet by mouth 2 (two) times daily.  Marland Kitchen spironolactone (ALDACTONE) 25 MG tablet Take 12.5 mg by mouth daily.  .  SUMAtriptan (IMITREX) 50 MG tablet Take 50 mg by mouth every 2 (two) hours as needed for migraine. May repeat in 2 hours if headache persists or recurs.  . [DISCONTINUED] ELIQUIS STARTER PACK (ELIQUIS STARTER PACK) 5 MG TABS Take as directed on package: start with two-5mg  tablets twice daily for 7 days. On day 8, switch to one-5mg  tablet twice daily.  Marland Kitchen apixaban (ELIQUIS) 5 MG TABS tablet Take 1 tablet (5 mg total) by mouth 2 (two) times daily.  . benzonatate (TESSALON) 100 MG capsule Take 1 capsule (100 mg total) by mouth 2 (two) times daily as needed for cough.  . ondansetron (ZOFRAN) 4 MG tablet Take 1 tablet (4 mg total) by mouth every 8 (eight) hours as needed for nausea or vomiting.  . sulfamethoxazole-trimethoprim (BACTRIM DS,SEPTRA DS) 800-160 MG tablet Take 1 tablet by mouth 2 (two) times daily.  . [DISCONTINUED] apixaban (ELIQUIS) 5 MG TABS tablet Take 2 tablets (10 mg total) by mouth stat for 1 dose.   No facility-administered encounter medications on file as of 04/06/2018.     Follow-up: Return in about 2 months (around 06/05/2018).   Kallie Locks, FNP

## 2018-04-06 NOTE — Patient Instructions (Addendum)
Ondansetron tablets What is this medicine? ONDANSETRON (on DAN se tron) is used to treat nausea and vomiting caused by chemotherapy. It is also used to prevent or treat nausea and vomiting after surgery. This medicine may be used for other purposes; ask your health care provider or pharmacist if you have questions. COMMON BRAND NAME(S): Zofran What should I tell my health care provider before I take this medicine? They need to know if you have any of these conditions: -heart disease -history of irregular heartbeat -liver disease -low levels of magnesium or potassium in the blood -an unusual or allergic reaction to ondansetron, granisetron, other medicines, foods, dyes, or preservatives -pregnant or trying to get pregnant -breast-feeding How should I use this medicine? Take this medicine by mouth with a glass of water. Follow the directions on your prescription label. Take your doses at regular intervals. Do not take your medicine more often than directed. Talk to your pediatrician regarding the use of this medicine in children. Special care may be needed. Overdosage: If you think you have taken too much of this medicine contact a poison control center or emergency room at once. NOTE: This medicine is only for you. Do not share this medicine with others. What if I miss a dose? If you miss a dose, take it as soon as you can. If it is almost time for your next dose, take only that dose. Do not take double or extra doses. What may interact with this medicine? Do not take this medicine with any of the following medications: -apomorphine -certain medicines for fungal infections like fluconazole, itraconazole, ketoconazole, posaconazole, voriconazole -cisapride -dofetilide -dronedarone -pimozide -thioridazine -ziprasidone This medicine may also interact with the following medications: -carbamazepine -certain medicines for depression, anxiety, or psychotic  disturbances -fentanyl -linezolid -MAOIs like Carbex, Eldepryl, Marplan, Nardil, and Parnate -methylene blue (injected into a vein) -other medicines that prolong the QT interval (cause an abnormal heart rhythm) -phenytoin -rifampicin -tramadol This list may not describe all possible interactions. Give your health care provider a list of all the medicines, herbs, non-prescription drugs, or dietary supplements you use. Also tell them if you smoke, drink alcohol, or use illegal drugs. Some items may interact with your medicine. What should I watch for while using this medicine? Check with your doctor or health care professional right away if you have any sign of an allergic reaction. What side effects may I notice from receiving this medicine? Side effects that you should report to your doctor or health care professional as soon as possible: -allergic reactions like skin rash, itching or hives, swelling of the face, lips or tongue -breathing problems -confusion -dizziness -fast or irregular heartbeat -feeling faint or lightheaded, falls -fever and chills -loss of balance or coordination -seizures -sweating -swelling of the hands or feet -tightness in the chest -tremors -unusually weak or tired Side effects that usually do not require medical attention (report to your doctor or health care professional if they continue or are bothersome): -constipation or diarrhea -headache This list may not describe all possible side effects. Call your doctor for medical advice about side effects. You may report side effects to FDA at 1-800-FDA-1088. Where should I keep my medicine? Keep out of the reach of children. Store between 2 and 30 degrees C (36 and 86 degrees F). Throw away any unused medicine after the expiration date. NOTE: This sheet is a summary. It may not cover all possible information. If you have questions about this medicine, talk to your doctor, pharmacist,  or health care  provider.  2019 Elsevier/[employer] Standard (2012-12-07 16:27:45) Nausea, Adult Nausea is feeling sick to your stomach or feeling that you are about to throw up (vomit). Feeling sick to your stomach is usually not serious, but it may be an early sign of a more serious medical problem. As you feel sicker to your stomach, you may throw up. If you throw up, or if you are not able to drink enough fluids, there is a risk that you may lose too much water in your body (get dehydrated). If you lose too much water in your body, you may:  Feel tired.  Feel thirsty.  Have a dry mouth.  Have cracked lips.  Go pee (urinate) less often. Older adults and people who have other diseases or a weak body defense system (immune system) have a higher risk of losing too much water in the body. The main goals of treating this condition are:  To relieve your nausea.  To ensure your nausea occurs less often.  To prevent throwing up and losing too much fluid. Follow these instructions at home: Watch your symptoms for any changes. Tell your doctor about them. Follow these instructions as told by your doctor. Eating and drinking      Take an ORS (oral rehydration solution). This is a drink that is sold at pharmacies and stores.  Drink clear fluids in small amounts as you are able. These include: ? Water. ? Ice chips. ? Fruit juice that has water added (diluted fruit juice). ? Low-calorie sports drinks.  Eat bland, easy-to-digest foods in small amounts as you are able, such as: ? Bananas. ? Applesauce. ? Rice. ? Low-fat (lean) meats. ? Toast. ? Crackers.  Avoid drinking fluids that have a lot of sugar or caffeine in them. This includes energy drinks, sports drinks, and soda.  Avoid alcohol.  Avoid spicy or fatty foods. General instructions  Take over-the-counter and prescription medicines only as told by your doctor.  Rest at home while you get better.  Drink enough fluid to keep your pee  (urine) pale yellow.  Take slow and deep breaths when you feel sick to your stomach.  Avoid food or things that have strong smells.  Wash your hands often with soap and water. If you cannot use soap and water, use hand sanitizer.  Make sure that all people in your home wash their hands well and often.  Keep all follow-up visits as told by your doctor. This is important. Contact a doctor if:  You feel sicker to your stomach.  You feel sick to your stomach for more than 2 days.  You throw up.  You are not able to drink fluids without throwing up.  You have new symptoms.  You have a fever.  You have a headache.  You have muscle cramps.  You have a rash.  You have pain while peeing.  You feel light-headed or dizzy. Get help right away if:  You have pain in your chest, neck, arm, or jaw.  You feel very weak or you pass out (faint).  You have throw up that is bright red or looks like coffee grounds.  You have bloody or black poop (stools) or poop that looks like tar.  You have a very bad headache, a stiff neck, or both.  You have very bad pain, cramping, or bloating in your belly (abdomen).  You have trouble breathing or you are breathing very quickly.  Your heart is beating very quickly.  Your skin feels cold and clammy.  You feel confused.  You have signs of losing too much water in your body, such as: ? Dark pee, very little pee, or no pee. ? Cracked lips. ? Dry mouth. ? Sunken eyes. ? Sleepiness. ? Weakness. These symptoms may be an emergency. Do not wait to see if the symptoms will go away. Get medical help right away. Call your local emergency services (911 in the [place] Do not drive yourself to the hospital. Summary  Nausea is feeling sick to your stomach or feeling that you are about to throw up (vomit).  If you throw up, or if you are not able to drink enough fluids, there is a risk that you may lose too much water in your body (get  dehydrated).  Eat and drink what your doctor tells you. Take over-the-counter and prescription medicines only as told by your doctor.  Contact a doctor right away if your symptoms get worse or you have new symptoms.  Keep all follow-up visits as told by your doctor. This is important. This information is not intended to replace advice given to you by your health care provider. Make sure you discuss any questions you have with your health care provider. Document Released: 02/19/2011 Document Revised: 08/10/2017 Document Reviewed: 08/10/2017 Elsevier Interactive Patient Education  2019 [employer] Sulfamethoxazole; Trimethoprim, SMX-TMP tablets What is this medicine? SULFAMETHOXAZOLE; TRIMETHOPRIM or SMX-TMP (suhl fuh meth OK suh zohl; trye METH oh prim) is a combination of a sulfonamide antibiotic and a second antibiotic, trimethoprim. It is used to treat or prevent certain kinds of bacterial infections. It will not work for colds, flu, or other viral infections. This medicine may be used for other purposes; ask your health care provider or pharmacist if you have questions. COMMON BRAND NAME(S): Bacter-Aid DS, Bactrim, Bactrim DS, Septra, Septra DS What should I tell my health care provider before I take this medicine? They need to know if you have any of these conditions: -anemia -asthma -being treated with anticonvulsants -if you frequently drink alcohol containing drinks -kidney disease -liver disease -low level of folic acid or [id number]-6-phosphate dehydrogenase -poor nutrition or malabsorption -porphyria -severe allergies -thyroid disorder -an unusual or allergic reaction to sulfamethoxazole, trimethoprim, sulfa drugs, other medicines, foods, dyes, or preservatives -pregnant or trying to get pregnant -breast-feeding How should I use this medicine? Take this medicine by mouth with a full glass of water. Follow the directions on the prescription label. Take your medicine at  regular intervals. Do not take it more often than directed. Do not skip doses or stop your medicine early. Talk to your pediatrician regarding the use of this medicine in children. Special care may be needed. This medicine has been used in children as young as 332 months of age. Overdosage: If you think you have taken too much of this medicine contact a poison control center or emergency room at once. NOTE: This medicine is only for you. Do not share this medicine with others. What if I miss a dose? If you miss a dose, take it as soon as you can. If it is almost time for your next dose, take only that dose. Do not take double or extra doses. What may interact with this medicine? Do not take this medicine with any of the following medications: -aminobenzoate potassium -dofetilide -metronidazole This medicine may also interact with the following medications: -ACE inhibitors like benazepril, enalapril, lisinopril, and ramipril -birth control pills -cyclosporine -digoxin -diuretics -indomethacin -medicines for diabetes -methenamine -  methotrexate -phenytoin -potassium supplements -pyrimethamine -sulfinpyrazone -tricyclic antidepressants -warfarin This list may not describe all possible interactions. Give your health care provider a list of all the medicines, herbs, non-prescription drugs, or dietary supplements you use. Also tell them if you smoke, drink alcohol, or use illegal drugs. Some items may interact with your medicine. What should I watch for while using this medicine? Tell your doctor or health care professional if your symptoms do not improve. Drink several glasses of water a day to reduce the risk of kidney problems. Do not treat diarrhea with over the counter products. Contact your doctor if you have diarrhea that lasts more than 2 days or if it is severe and watery. This medicine can make you more sensitive to the sun. Keep out of the sun. If you cannot avoid being in the sun,  wear protective clothing and use a sunscreen. Do not use sun lamps or tanning beds/booths. What side effects may I notice from receiving this medicine? Side effects that you should report to your doctor or health care professional as soon as possible: -allergic reactions like skin rash or hives, swelling of the face, lips, or tongue -breathing problems -fever or chills, sore throat -irregular heartbeat, chest pain -joint or muscle pain -pain or difficulty passing urine -red pinpoint spots on skin -redness, blistering, peeling or loosening of the skin, including inside the mouth -unusual bleeding or bruising -unusually weak or tired -yellowing of the eyes or skin Side effects that usually do not require medical attention (report to your doctor or health care professional if they continue or are bothersome): -diarrhea -dizziness -headache -loss of appetite -nausea, vomiting -nervousness This list may not describe all possible side effects. Call your doctor for medical advice about side effects. You may report side effects to FDA at 1-800-FDA-1088. Where should I keep my medicine? Keep out of the reach of children. Store at room temperature between 20 to 25 degrees C (68 to 77 degrees F). Protect from light. Throw away any unused medicine after the expiration date. NOTE: This sheet is a summary. It may not cover all possible information. If you have questions about this medicine, talk to your doctor, pharmacist, or health care provider.  2019 Elsevier/Gold Standard (2012-10-07 14:38:26) Urinary Tract Infection, Adult A urinary tract infection (UTI) is an infection of any part of the urinary tract. The urinary tract includes:  The kidneys.  The ureters.  The bladder.  The urethra. These organs make, store, and get rid of pee (urine) in the body. What are the causes? This is caused by germs (bacteria) in your genital area. These germs grow and cause swelling (inflammation) of your  urinary tract. What increases the risk? You are more likely to develop this condition if:  You have a small, thin tube (catheter) to drain pee.  You cannot control when you pee or poop (incontinence).  You are female, and: ? You use these methods to prevent pregnancy: ? A medicine that kills sperm (spermicide). ? A device that blocks sperm (diaphragm). ? You have low levels of a female hormone (estrogen). ? You are pregnant.  You have genes that add to your risk.  You are sexually active.  You take antibiotic medicines.  You have trouble peeing because of: ? A prostate that is bigger than normal, if you are female. ? A blockage in the part of your body that drains pee from the bladder (urethra). ? A kidney stone. ? A nerve condition that affects your bladder (  neurogenic bladder). ? Not getting enough to drink. ? Not peeing often enough.  You have other conditions, such as: ? Diabetes. ? A weak disease-fighting system (immune system). ? Sickle cell disease. ? Gout. ? Injury of the spine. What are the signs or symptoms? Symptoms of this condition include:  Needing to pee right away (urgently).  Peeing often.  Peeing small amounts often.  Pain or burning when peeing.  Blood in the pee.  Pee that smells bad or not like normal.  Trouble peeing.  Pee that is cloudy.  Fluid coming from the vagina, if you are female.  Pain in the belly or lower back. Other symptoms include:  Throwing up (vomiting).  No urge to eat.  Feeling mixed up (confused).  Being tired and grouchy (irritable).  A fever.  Watery poop (diarrhea). How is this treated? This condition may be treated with:  Antibiotic medicine.  Other medicines.  Drinking enough water. Follow these instructions at home:  Medicines  Take over-the-counter and prescription medicines only as told by your doctor.  If you were prescribed an antibiotic medicine, take it as told by your doctor. Do not  stop taking it even if you start to feel better. General instructions  Make sure you: ? Pee until your bladder is empty. ? Do not hold pee for a long time. ? Empty your bladder after sex. ? Wipe from front to back after pooping if you are a female. Use each tissue one time when you wipe.  Drink enough fluid to keep your pee pale yellow.  Keep all follow-up visits as told by your doctor. This is important. Contact a doctor if:  You do not get better after 1-2 days.  Your symptoms go away and then come back. Get help right away if:  You have very bad back pain.  You have very bad pain in your lower belly.  You have a fever.  You are sick to your stomach (nauseous).  You are throwing up. Summary  A urinary tract infection (UTI) is an infection of any part of the urinary tract.  This condition is caused by germs in your genital area.  There are many risk factors for a UTI. These include having a small, thin tube to drain pee and not being able to control when you pee or poop.  Treatment includes antibiotic medicines for germs.  Drink enough fluid to keep your pee pale yellow. This information is not intended to replace advice given to you by your health care provider. Make sure you discuss any questions you have with your health care provider. Document Released: 08/19/2007 Document Revised: 09/09/2017 Document Reviewed: 09/09/2017 Elsevier Interactive Patient Education  2019 [employer] Inc. Apixaban oral tablets What is this medicine? APIXABAN (a PIX a ban) is an anticoagulant (blood thinner). It is used to lower the chance of stroke in people with a medical condition called atrial fibrillation. It is also used to treat or prevent blood clots in the lungs or in the veins. This medicine may be used for other purposes; ask your health care provider or pharmacist if you have questions. COMMON BRAND NAME(S): Eliquis What should I tell my health care provider before I take this  medicine? They need to know if you have any of these conditions: -bleeding disorders -bleeding in the brain -blood in your stools (black or tarry stools) or if you have blood in your vomit -history of stomach bleeding -kidney disease -liver disease -mechanical heart valve -an unusual or  allergic reaction to apixaban, other medicines, foods, dyes, or preservatives -pregnant or trying to get pregnant -breast-feeding How should I use this medicine? Take this medicine by mouth with a glass of water. Follow the directions on the prescription label. You can take it with or without food. If it upsets your stomach, take it with food. Take your medicine at regular intervals. Do not take it more often than directed. Do not stop taking except on your doctor's advice. Stopping this medicine may increase your risk of a blood clot. Be sure to refill your prescription before you run out of medicine. Talk to your pediatrician regarding the use of this medicine in children. Special care may be needed. Overdosage: If you think you have taken too much of this medicine contact a poison control center or emergency room at once. NOTE: This medicine is only for you. Do not share this medicine with others. What if I miss a dose? If you miss a dose, take it as soon as you can. If it is almost time for your next dose, take only that dose. Do not take double or extra doses. What may interact with this medicine? This medicine may interact with the following: -aspirin and aspirin-like medicines -certain medicines for fungal infections like ketoconazole and itraconazole -certain medicines for seizures like carbamazepine and phenytoin -certain medicines that treat or prevent blood clots like warfarin, enoxaparin, and dalteparin -clarithromycin -NSAIDs, medicines for pain and inflammation, like ibuprofen or naproxen -rifampin -ritonavir -St. John's wort This list may not describe all possible interactions. Give your  health care provider a list of all the medicines, herbs, non-prescription drugs, or dietary supplements you use. Also tell them if you smoke, drink alcohol, or use illegal drugs. Some items may interact with your medicine. What should I watch for while using this medicine? Visit your healthcare professional for regular checks on your progress. You may need blood work done while you are taking this medicine. Your condition will be monitored carefully while you are receiving this medicine. It is important not to miss any appointments. Avoid sports and activities that might cause injury while you are using this medicine. Severe falls or injuries can cause unseen bleeding. Be careful when using sharp tools or knives. Consider using an Neurosurgeon. Take special care brushing or flossing your teeth. Report any injuries, bruising, or red spots on the skin to your healthcare professional. If you are going to need surgery or other procedure, tell your healthcare professional that you are taking this medicine. Wear a medical ID bracelet or chain. Carry a card that describes your disease and details of your medicine and dosage times. What side effects may I notice from receiving this medicine? Side effects that you should report to your doctor or health care professional as soon as possible: -allergic reactions like skin rash, itching or hives, swelling of the face, lips, or tongue -signs and symptoms of bleeding such as bloody or black, tarry stools; red or dark-brown urine; spitting up blood or brown material that looks like coffee grounds; red spots on the skin; unusual bruising or bleeding from the eye, gums, or nose -signs and symptoms of a blood clot such as chest pain; shortness of breath; pain, swelling, or warmth in the leg -signs and symptoms of a stroke such as changes in vision; confusion; trouble speaking or understanding; severe headaches; sudden numbness or weakness of the face, arm or leg; trouble  walking; dizziness; loss of coordination This list may not describe all  possible side effects. Call your doctor for medical advice about side effects. You may report side effects to FDA at 1-800-FDA-1088. Where should I keep my medicine? Keep out of the reach of children. Store at room temperature between 20 and 25 degrees C (68 and 77 degrees F). Throw away any unused medicine after the expiration date. NOTE: This sheet is a summary. It may not cover all possible information. If you have questions about this medicine, talk to your doctor, pharmacist, or health care provider.  2019 [employer] Standard (2017-02-25 11:20:07) Pulmonary Embolism  A pulmonary embolism (PE) is a sudden blockage or decrease of blood flow in one lung or both lungs. Most blockages come from a blood clot that forms in a lower leg, thigh, or arm vein (deep vein thrombosis, DVT) and travels to the lungs. A clot is blood that has thickened into a gel or solid. PE is a dangerous and life-threatening condition that needs to be treated right away. What are the causes? This condition is usually caused by a blood clot that forms in a vein and moves to the lungs. In rare cases, it may be caused by air, fat, part of a tumor, or other tissue that moves through the veins and into the lungs. What increases the risk? The following factors may make you more likely to develop this condition:  Traumatic injury, such as breaking a hip or leg.  Spinal cord injury.  Orthopedic surgery, especially hip or knee replacement.  Any major surgery.  Stroke.  Having DVT.  Blood clots or blood clotting disease.  Long-term (chronic) lung or heart disease.  Taking medicines that contain estrogen. These include birth control pills and hormone replacement therapy.  Cancer and chemotherapy.  Having a central venous catheter.  Pregnancy and the period of time after delivery (postpartum).  Being older than age 63.  Being  overweight.  Smoking. What are the signs or symptoms? Symptoms of this condition usually start suddenly and include:  Shortness of breath during activity or at rest.  Coughing or coughing up blood or blood-tinged mucus.  Chest pain that is often worse with deep breaths.  Rapid or irregular heartbeat.  Feeling light-headed or dizzy.  Fainting.  Feeling anxious.  Fever.  Sweating.  Pain and swelling in a leg. This is a symptom of DVT, which can lead to PE. How is this diagnosed? This condition may be diagnosed based on:  Your medical history.  A physical exam.  Blood tests.  CT pulmonary angiogram. This test checks blood flow in and around your lungs.  Ventilation-perfusion scan, also called a lung VQ scan. This test measures air flow and blood flow to the lungs.  Ultrasound of the legs. How is this treated? Treatment for this condition depends on many factors, such as the cause of your PE, your risk for bleeding or developing more clots, and other medical conditions you have. Treatment aims to remove, dissolve, or stop blood clots from forming or growing larger. Treatment may include:  Medicines, such as: ? Blood thinning medicines (anticoagulants) to stop clots from forming or growing. ? Medicines that dissolve clots (thrombolytics).  Procedures, such as: ? Using a flexible tube to remove a blood clot (embolectomy) or deliver medicine to destroy it (catheter-directed thrombolysis). ? Inserting a filter into a large vein that carries blood to the heart (inferior vena cava). This filter (vena cava filter) catches blood clots before they reach the lungs. ? Surgery to remove the clot (surgical embolectomy). This is  rare. You may need a combination of immediate, long-term (up to 3 months after diagnosis), and extended (more than 3 months after diagnosis) treatments. Your treatment may continue for several months (maintenance therapy). You and your health care provider  will work together to choose the treatment program that is best for you. Follow these instructions at home: Medicines  Take over-the-counter and prescription medicines only as told by your health care provider.  If you are taking an anticoagulant medicine: ? Take the medicine every day at the same time each day. ? Understand what foods and drugs interact with your medicine. ? Understand the side effects of this medicine, including excessive bruising or bleeding. Ask your health care provider or pharmacist about other side effects. General instructions  Wear a medical alert bracelet or carry a medical alert card that says you have had a PE and lists what medicines you take.  Ask your health care provider when you may return to your normal activities. Avoid sitting or lying for a long time without moving.  Maintain a healthy weight. Ask your health care provider what weight is healthy for you.  Do not use any products that contain nicotine or tobacco, such as cigarettes and e-cigarettes. If you need help quitting, ask your health care provider.  Talk with your health care provider about any travel plans. It is important to make sure that you are still able to take your medicine while on trips.  Keep all follow-up visits as told by your health care provider. This is important. Contact a health care provider if:  You missed a dose of your blood thinner medicine. Get help right away if:  You have: ? New or increased pain, swelling, warmth, or redness in an arm or leg. ? Numbness or tingling in an arm or leg. ? Shortness of breath during activity or at rest. ? A fever. ? Chest pain. ? A rapid or irregular heartbeat. ? A severe headache. ? Vision changes. ? A serious fall or accident, or you hit your head. ? Stomach (abdominal) pain. ? Blood in your vomit, stool, or urine. ? A cut that will not stop bleeding.  You cough up blood.  You feel light-headed or dizzy.  You cannot  move your arms or legs.  You are confused or have memory loss. These symptoms may represent a serious problem that is an emergency. Do not wait to see if the symptoms will go away. Get medical help right away. Call your local emergency services (911 in the [place] Do not drive yourself to the hospital. Summary  A pulmonary embolism (PE) is a sudden blockage or decrease of blood flow in one lung or both lungs. PE is a dangerous and life-threatening condition that needs to be treated right away.  Treatments for this condition usually include medicines to thin your blood (anticoagulants) or medicines to break apart blood clots (thrombolytics).  If you are given blood thinners, it is important to take the medicine every single day at the same time each day.  If you have signs of PE or DVT, call your local emergency services (911 in the [place] This information is not intended to replace advice given to you by your health care provider. Make sure you discuss any questions you have with your health care provider. Document Released: 02/28/2000 Document Revised: 10/15/2017 Document Reviewed: 04/15/2017 Elsevier Interactive Patient Education  2019 Elsevier Inc.    Benzonatate capsules What is this medicine? BENZONATATE (ben ZOE na tate) is used  to treat cough. This medicine may be used for other purposes; ask your health care provider or pharmacist if you have questions. COMMON BRAND NAME(S): Tessalon Perles, Zonatuss What should I tell my health care provider before I take this medicine? They need to know if you have any of these conditions: -kidney or liver disease -an unusual or allergic reaction to benzonatate, anesthetics, other medicines, foods, dyes, or preservatives -pregnant or trying to get pregnant -breast-feeding How should I use this medicine? Take this medicine by mouth with a glass of water. Follow the directions on the prescription label. Avoid breaking, chewing, or sucking the  capsule, as this can cause serious side effects. Take your medicine at regular intervals. Do not take your medicine more often than directed. Talk to your pediatrician regarding the use of this medicine in children. While this drug may be prescribed for children as young as 74 years old for selected conditions, precautions do apply. Overdosage: If you think you have taken too much of this medicine contact a poison control center or emergency room at once. NOTE: This medicine is only for you. Do not share this medicine with others. What if I miss a dose? If you miss a dose, take it as soon as you can. If it is almost time for your next dose, take only that dose. Do not take double or extra doses. What may interact with this medicine? Do not take this medicine with any of the following medications: -MAOIs like Carbex, Eldepryl, Marplan, Nardil, and Parnate This list may not describe all possible interactions. Give your health care provider a list of all the medicines, herbs, non-prescription drugs, or dietary supplements you use. Also tell them if you smoke, drink alcohol, or use illegal drugs. Some items may interact with your medicine. What should I watch for while using this medicine? Tell your doctor if your symptoms do not improve or if they get worse. If you have a high fever, skin rash, or headache, see your health care professional. You may get drowsy or dizzy. Do not drive, use machinery, or do anything that needs mental alertness until you know how this medicine affects you. Do not sit or stand up quickly, especially if you are an older patient. This reduces the risk of dizzy or fainting spells. What side effects may I notice from receiving this medicine? Side effects that you should report to your doctor or health care professional as soon as possible: -allergic reactions like skin rash, itching or hives, swelling of the face, lips, or tongue -breathing problems -chest pain -confusion or  hallucinations -irregular heartbeat -numbness of mouth or throat -seizures Side effects that usually do not require medical attention (report to your doctor or health care professional if they continue or are bothersome): -burning feeling in the eyes -constipation -headache -nasal congestion -stomach upset This list may not describe all possible side effects. Call your doctor for medical advice about side effects. You may report side effects to FDA at 1-800-FDA-1088. Where should I keep my medicine? Keep out of the reach of children. Store at room temperature between 15 and 30 degrees C (59 and 86 degrees F). Keep tightly closed. Protect from light and moisture. Throw away any unused medicine after the expiration date. NOTE: This sheet is a summary. It may not cover all possible information. If you have questions about this medicine, talk to your doctor, pharmacist, or health care provider.  2019 Elsevier/Gold Standard (2007-06-01 14:52:56)  Cough, Adult  A cough helps  to clear your throat and lungs. A cough may last only 2-3 weeks (acute), or it may last longer than 8 weeks (chronic). Many different things can cause a cough. A cough may be a sign of an illness or another medical condition. Follow these instructions at home:  Pay attention to any changes in your cough.  Take medicines only as told by your doctor. ? If you were prescribed an antibiotic medicine, take it as told by your doctor. Do not stop taking it even if you start to feel better. ? Talk with your doctor before you try using a cough medicine.  Drink enough fluid to keep your pee (urine) clear or pale yellow.  If the air is dry, use a cold steam vaporizer or humidifier in your home.  Stay away from things that make you cough at work or at home.  If your cough is worse at night, try using extra pillows to raise your head up higher while you sleep.  Do not smoke, and try not to be around smoke. If you need help  quitting, ask your doctor.  Do not have caffeine.  Do not drink alcohol.  Rest as needed. Contact a doctor if:  You have new problems (symptoms).  You cough up yellow fluid (pus).  Your cough does not get better after 2-3 weeks, or your cough gets worse.  Medicine does not help your cough and you are not sleeping well.  You have pain that gets worse or pain that is not helped with medicine.  You have a fever.  You are losing weight and you do not know why.  You have night sweats. Get help right away if:  You cough up blood.  You have trouble breathing.  Your heartbeat is very fast. This information is not intended to replace advice given to you by your health care provider. Make sure you discuss any questions you have with your health care provider. Document Released: 11/13/2010 Document Revised: 08/08/2015 Document Reviewed: 05/09/2014 Elsevier Interactive Patient Education  2019 [employer].   Hypocalcemia, Adult Hypocalcemia is when the level of calcium in a person's blood is below normal. Calcium is a mineral that is used by the body in many ways. A lack of blood calcium can affect the heart and muscles, make the bones more likely to break, and cause other problems. What are the causes? This condition may be caused by:  Decreased production (hypoparathyroidism) or improper use of parathyroid hormone.  Problems with the parathyroid glands or surgical removal of these glands.  Problems with parathyroid function after removal of the thyroid gland.  Lack (deficiency) of vitamin D or magnesium or both.  Kidney problems. Less common causes include:  Intestinal problems that interfere with nutrient absorption.  Alcoholism.  Low levels of a body protein that is called albumin.  Inflammation of the pancreas (pancreatitis).  Certain medicines.  Severe infections (sepsis).  Certain diseases, such as sarcoidosis or hemochromatosis, that cause the parathyroid  glands to be filled with cells or substances that are not normally present.  Breakdown of large amounts of muscle fiber.  High levels of phosphate in the body.  Cancer.  Massive blood transfusions, which usually occur with severe trauma. What are the signs or symptoms? Symptoms of this condition include:  Numbness and tingling in the fingers, toes, or around the mouth.  Muscle aches or cramps, especially in the legs, feet, and back.  Muscle twitches.  Abdominal cramping or pain.  Memory problems, confusion, or  difficulty thinking.  Depression, anxiety, irritability, or changes in personality.  Fainting.  Chest pain.  Difficulty swallowing.  Changes in the sound of the voice.  Shortness of breath or wheezing.  General weakness and fatigue. Symptoms of severe hypocalcemia include:  Shaking uncontrollably (seizures).  Seizure of the voice box (laryngospasm).  Fast heartbeats (palpitations) and abnormal heart rhythms (arrhythmias). Long-term symptoms of this condition include:  Coarse, brittle hair and nails.  Dry skin or lasting (chronic) skin diseases (psoriasis, eczema,, or dermatitis).  Clouding of the eye lens (cataracts). How is this diagnosed? This condition is usually diagnosed with a blood test. You may also have other tests to help determine the underlying cause of the condition. For example, a test may be done that records the electrical activity of the heart (electrocardiogram,or ECG). How is this treated? Treatment for this condition may include:  Calcium given by mouth (orally) or given through an IV tube that is inserted into one of your veins. The method used for giving calcium will depend on the severity of the condition.  Other minerals (electrolytes), such as magnesium. Other treatment will depend on the cause of the condition. Follow these instructions at home:  Follow diet instructions from your health care provider or dietitian.  Take  supplements only as told by your health care provider.  Keep all follow-up visits as told by your health care provider. This is important. Contact a health care provider if:  You have increased fatigue.  You have increased muscle twitching.  You have new swelling in the feet, ankles, or legs.  You develop changes in mood, memory, or personality. Get help right away if:  You have chest pain.  You have persistent rapid or irregular heartbeats.  You have difficulty breathing.  You faint.  You start to have seizures.  You have confusion. This information is not intended to replace advice given to you by your health care provider. Make sure you discuss any questions you have with your health care provider. Document Released: 08/20/2009 Document Revised: 08/08/2015 Document Reviewed: 07/18/2014 Elsevier Interactive Patient Education  2019 [employer].

## 2018-06-06 ENCOUNTER — Encounter: Payer: Self-pay | Admitting: Family Medicine

## 2018-06-06 ENCOUNTER — Other Ambulatory Visit: Payer: Self-pay

## 2018-06-06 ENCOUNTER — Ambulatory Visit (INDEPENDENT_AMBULATORY_CARE_PROVIDER_SITE_OTHER): Payer: Self-pay | Admitting: Family Medicine

## 2018-06-06 VITALS — BP 152/96 | HR 92 | Temp 97.7°F | Ht 62.0 in | Wt 187.0 lb

## 2018-06-06 DIAGNOSIS — Z09 Encounter for follow-up examination after completed treatment for conditions other than malignant neoplasm: Secondary | ICD-10-CM

## 2018-06-06 DIAGNOSIS — I5022 Chronic systolic (congestive) heart failure: Secondary | ICD-10-CM

## 2018-06-06 DIAGNOSIS — I10 Essential (primary) hypertension: Secondary | ICD-10-CM

## 2018-06-06 DIAGNOSIS — I2699 Other pulmonary embolism without acute cor pulmonale: Secondary | ICD-10-CM

## 2018-06-06 LAB — POCT URINALYSIS DIP (MANUAL ENTRY)
Bilirubin, UA: NEGATIVE
Blood, UA: NEGATIVE
Glucose, UA: NEGATIVE mg/dL
Leukocytes, UA: NEGATIVE
Nitrite, UA: NEGATIVE
Protein Ur, POC: NEGATIVE mg/dL
Spec Grav, UA: >=1.03 — AB (ref 1.010–1.025)
Urobilinogen, UA: 0.2 E.U./dL
pH, UA: 5.5 (ref 5.0–8.0)

## 2018-06-06 MED ORDER — CARVEDILOL 6.25 MG PO TABS: 6.2500 mg | ORAL_TABLET | Freq: Two times a day (BID) | ORAL | 3 refills | Status: DC

## 2018-06-06 NOTE — Progress Notes (Signed)
[facility]  Established Patient Office Visit  Subjective:  Patient ID: Alexandria Rhodes, female    DOB: 1975-11-20  Age: 43 y.o. MRN: [id number]  CC:  Chief Complaint  Patient presents with  . Follow-up    chronic condition     HPI Alexandria Rhodes is a 43 year old female who presents for follow up today.   Past Medical History:  Diagnosis Date  . Acute pulmonary embolism (HCC)   . Cardiomyopathy (HCC)   . CHF (congestive heart failure) (HCC)   . Hypertension    Current Status: Since her last office visit, she is doing well with no complaints. She states that she has no been taking any of her medications X 1 month. She is currently working full-time. She denies visual changes, chest pain, cough, shortness of breath, heart palpitations, and falls. She has occasional headaches and dizziness with position changes. Denies severe headaches, confusion, seizures, double vision, and blurred vision, nausea and vomiting.  She denies fevers, chills, fatigue, recent infections, weight loss, and night sweats. No reports of GI problems such as nausea, vomiting, diarrhea, and constipation. She has no reports of blood in stools, dysuria and hematuria. No depression or anxiety reported. She denies pain today.   No past surgical history on file.  No family history on file.  Social History   Socioeconomic History  . Marital status: Single    Spouse name: Not on file  . Number of children: Not on file  . Years of education: Not on file  . Highest education level: Not on file  Occupational History  . Not on file  Social Needs  . Financial resource strain: Not hard at all  . Food insecurity:    Worry: Patient refused    Inability: Patient refused  . Transportation needs:    Medical: Patient refused    Non-medical: Patient refused  Tobacco Use  . Smoking status: Never Smoker  . Smokeless tobacco: Never Used  Substance and Sexual Activity  .  Alcohol use: Not Currently  . Drug use: Not Currently  . Sexual activity: Yes  Lifestyle  . Physical activity:    Days per week: Patient refused    Minutes per session: Patient refused  . Stress: Not at all  Relationships  . Social connections:    Talks on phone: Patient refused    Gets together: Patient refused    Attends religious service: Patient refused    Active member of club or organization: Patient refused    Attends meetings of clubs or organizations: Patient refused    Relationship status: Patient refused  . Intimate partner violence:    Fear of current or ex partner: Patient refused    Emotionally abused: Patient refused    Physically abused: Patient refused    Forced sexual activity: Patient refused  Other Topics Concern  . Not on file  Social History Narrative  . Not on file    Outpatient Medications Prior to Visit  Medication Sig Dispense Refill  . apixaban (ELIQUIS) 5 MG TABS tablet Take 1 tablet (5 mg total) by mouth 2 (two) times daily. (Patient not taking: Reported on 06/06/2018) 60 tablet 3  . ondansetron (ZOFRAN) 4 MG tablet Take 1 tablet (4 mg total) by mouth every 8 (eight) hours as needed for nausea or vomiting. (Patient not taking: Reported on 06/06/2018) 20 tablet 1  . sacubitril-valsartan (ENTRESTO) 97-103 MG Take 1 tablet by mouth 2 (two) times daily.    Marland Kitchen  spironolactone (ALDACTONE) 25 MG tablet Take 12.5 mg by mouth daily.    . SUMAtriptan (IMITREX) 50 MG tablet Take 50 mg by mouth every 2 (two) hours as needed for migraine. May repeat in 2 hours if headache persists or recurs.    . benzonatate (TESSALON) 100 MG capsule Take 1 capsule (100 mg total) by mouth 2 (two) times daily as needed for cough. (Patient not taking: Reported on 06/06/2018) 20 capsule 1  . carvedilol (COREG) 6.25 MG tablet Take 6.25 mg by mouth 2 (two) times daily with a meal.    . sulfamethoxazole-trimethoprim (BACTRIM DS,SEPTRA DS) 800-160 MG tablet Take 1 tablet by mouth 2 (two) times  daily. (Patient not taking: Reported on 06/06/2018) 14 tablet 0   No facility-administered medications prior to visit.     Allergies  Allergen Reactions  . Shellfish Allergy Anaphylaxis    ROS Review of Systems  Constitutional: Negative.   HENT: Negative.   Eyes: Negative.   Respiratory: Negative.   Cardiovascular: Negative.   Gastrointestinal: Negative.   Endocrine: Negative.   Genitourinary: Negative.   Musculoskeletal: Negative.   Skin: Negative.   Allergic/Immunologic: Negative.   Neurological: Positive for dizziness and headaches.  Hematological: Negative.   Psychiatric/Behavioral: Negative.       Objective:    Physical Exam  Constitutional: She is oriented to person, place, and time. She appears well-developed and well-nourished.  HENT:  Head: Normocephalic and atraumatic.  Eyes: Conjunctivae are normal.  Neck: Normal range of motion. Neck supple.  Cardiovascular: Normal rate, regular rhythm, normal heart sounds and intact distal pulses.  Pulmonary/Chest: Effort normal and breath sounds normal.  Abdominal: Soft. Bowel sounds are normal.  Musculoskeletal: Normal range of motion.  Neurological: She is alert and oriented to person, place, and time. She has normal reflexes.  Skin: Skin is warm and dry.  Psychiatric: She has a normal mood and affect. Her behavior is normal. Judgment and thought content normal.  Nursing note and vitals reviewed.   BP (!) 152/96   Pulse 92   Temp 97.7 F (36.5 C) (Oral)   Ht 5\' 2"  (1.575 m)   Wt 187 lb (84.8 kg)   LMP 03/27/2014 (Approximate)   SpO2 100%   BMI 34.20 kg/m   Wt Readings from Last 3 Encounters:  06/06/18 187 lb (84.8 kg)  04/06/18 181 lb (82.1 kg)  03/28/18 190 lb 4.8 oz (86.3 kg)     Health Maintenance Due  Topic Date Due  . PAP SMEAR-Modifier  09/22/1995    There are no preventive care reminders to display for this patient.  No results found for: TSH Lab Results  Component Value Date   WBC 6.5  03/29/2018   HGB 11.5 (L) 03/29/2018   HCT 38.7 03/29/2018   MCV 94.6 03/29/2018   PLT 281 03/29/2018   Lab Results  Component Value Date   NA 142 03/28/2018   K 3.6 03/28/2018   CO2 25 03/28/2018   GLUCOSE 99 03/28/2018   BUN 12 03/28/2018   CREATININE 0.67 03/28/2018   CALCIUM 8.7 (L) 03/28/2018   ANIONGAP 7 03/28/2018   No results found for: CHOL No results found for: HDL No results found for: LDLCALC No results found for: TRIG No results found for: [facility] Lab Results  Component Value Date   HGBA1C 5.4 04/06/2018     Assessment & Plan:   1. Essential hypertension She will get money to pick up refill on Coreg today.  - carvedilol (COREG) 6.25 MG tablet;  Take 1 tablet (6.25 mg total) by mouth 2 (two) times daily with a meal.  Dispense: 30 tablet; Refill: 3  2. Chronic systolic CHF (congestive heart failure) (HCC) Stable today.   3. Other acute pulmonary embolism without acute cor pulmonale (HCC) She is currently not taking Eliquis, because she is unable to afford. She has been out of medication X 1 month now. Pharmacist at [facility] to contact patient with possible assistance in purchasing Eliquis.   4. Follow up She will follow up in 1 month.  - POCT urinalysis dipstick  Meds ordered this encounter  Medications  . carvedilol (COREG) 6.25 MG tablet    Sig: Take 1 tablet (6.25 mg total) by mouth 2 (two) times daily with a meal.    Dispense:  30 tablet    Refill:  3    Orders Placed This Encounter  Procedures  . POCT urinalysis dipstick    Referral Orders  No referral(s) requested today    Raliegh Ip,  MSN, FNP-C [facility] [address] [place], [place] [address] [phone]   Problem List Items Addressed This Visit      Cardiovascular and Mediastinum   Acute pulmonary embolism (HCC)   Relevant Medications   carvedilol (COREG) 6.25 MG tablet   Chronic systolic CHF (congestive  heart failure) (HCC) (Chronic)   Relevant Medications   carvedilol (COREG) 6.25 MG tablet   HTN (hypertension) - Primary (Chronic)   Relevant Medications   carvedilol (COREG) 6.25 MG tablet    Other Visit Diagnoses    Follow up       Relevant Orders   POCT urinalysis dipstick (Completed)      Meds ordered this encounter  Medications  . carvedilol (COREG) 6.25 MG tablet    Sig: Take 1 tablet (6.25 mg total) by mouth 2 (two) times daily with a meal.    Dispense:  30 tablet    Refill:  3    Follow-up: No follow-ups on file.    Kallie Locks, FNP

## 2018-06-07 ENCOUNTER — Telehealth: Payer: Self-pay

## 2018-06-07 NOTE — Telephone Encounter (Signed)
-----   Message from Kallie Locks, FNP sent at 06/06/2018  7:55 PM EDT ----- Regarding: "Blood Pressure Re-Check" Please add blood pressure re-check level to patient's chart, and inform me once added, so that I can complete chart for the day.

## 2018-06-07 NOTE — Telephone Encounter (Signed)
Both reading are already in the chart

## 2018-07-08 ENCOUNTER — Ambulatory Visit: Payer: Self-pay | Admitting: Family Medicine

## 2018-11-28 ENCOUNTER — Encounter (HOSPITAL_COMMUNITY): Payer: Self-pay | Admitting: Emergency Medicine

## 2018-11-28 ENCOUNTER — Emergency Department (HOSPITAL_COMMUNITY)
Admission: EM | Admit: 2018-11-28 | Discharge: 2018-11-29 | Disposition: A | Discharge status: Home / Self Care | Payer: No Typology Code available for payment source | Attending: Emergency Medicine | Admitting: Emergency Medicine

## 2018-11-28 ENCOUNTER — Other Ambulatory Visit: Payer: Self-pay

## 2018-11-28 DIAGNOSIS — U071 COVID-19: Secondary | ICD-10-CM | POA: Insufficient documentation

## 2018-11-28 DIAGNOSIS — R51 Headache: Secondary | ICD-10-CM | POA: Diagnosis not present

## 2018-11-28 DIAGNOSIS — Z79899 Other long term (current) drug therapy: Secondary | ICD-10-CM | POA: Diagnosis not present

## 2018-11-28 DIAGNOSIS — I110 Hypertensive heart disease with heart failure: Secondary | ICD-10-CM | POA: Diagnosis not present

## 2018-11-28 DIAGNOSIS — I10 Essential (primary) hypertension: Secondary | ICD-10-CM

## 2018-11-28 DIAGNOSIS — I509 Heart failure, unspecified: Secondary | ICD-10-CM | POA: Insufficient documentation

## 2018-11-28 DIAGNOSIS — R519 Headache, unspecified: Secondary | ICD-10-CM

## 2018-11-28 LAB — COMPREHENSIVE METABOLIC PANEL
ALT: 15 U/L (ref 0–44)
AST: 16 U/L (ref 15–41)
Albumin: 3.8 g/dL (ref 3.5–5.0)
Alkaline Phosphatase: 78 U/L (ref 38–126)
Anion gap: 10 (ref 5–15)
BUN: 13 mg/dL (ref 6–20)
CO2: 25 mmol/L (ref 22–32)
Calcium: 9.5 mg/dL (ref 8.9–10.3)
Chloride: 104 mmol/L (ref 98–111)
Creatinine, Ser: 0.77 mg/dL (ref 0.44–1.00)
GFR calc Af Amer: >60 mL/min (ref >60)
GFR calc non Af Amer: >60 mL/min (ref >60)
Glucose, Bld: 109 mg/dL — ABNORMAL HIGH (ref 70–99)
Potassium: 3.8 mmol/L (ref 3.5–5.1)
Sodium: 139 mmol/L (ref 135–145)
Total Bilirubin: 0.3 mg/dL (ref 0.3–1.2)
Total Protein: 8.1 g/dL (ref 6.5–8.1)

## 2018-11-28 LAB — TROPONIN I (HIGH SENSITIVITY): Troponin I (High Sensitivity): 12 ng/L (ref <18)

## 2018-11-28 LAB — CBC
HCT: 40.9 % (ref 36.0–46.0)
Hemoglobin: 12.8 g/dL (ref 12.0–15.0)
MCH: 27.5 pg (ref 26.0–34.0)
MCHC: 31.3 g/dL (ref 30.0–36.0)
MCV: 88 fL (ref 80.0–100.0)
Platelets: 277 10*3/uL (ref 150–400)
RBC: 4.65 MIL/uL (ref 3.87–5.11)
RDW: 13.8 % (ref 11.5–15.5)
WBC: 3.8 10*3/uL — ABNORMAL LOW (ref 4.0–10.5)
nRBC: 0 % (ref 0.0–0.2)

## 2018-11-28 NOTE — ED Triage Notes (Signed)
Pt here from home with c/o H/A and HTN after not having her B/P meds for about 2 months , no other complaints

## 2018-11-29 LAB — SARS CORONAVIRUS 2 (TAT 6-24 HRS): SARS Coronavirus 2: POSITIVE — AB

## 2018-11-29 LAB — TROPONIN I (HIGH SENSITIVITY): Troponin I (High Sensitivity): 13 ng/L (ref <18)

## 2018-11-29 MED ORDER — AMLODIPINE BESYLATE 5 MG PO TABS: 5.0000 mg | ORAL_TABLET | Freq: Every day | ORAL | 0 refills | Status: DC

## 2018-11-29 MED ORDER — AMLODIPINE BESYLATE 5 MG PO TABS
5.0000 mg | ORAL_TABLET | Freq: Once | ORAL | Status: AC
  Administered 2018-11-29: 5 mg via ORAL
  Filled 2018-11-29: qty 1

## 2018-11-29 MED ORDER — KETOROLAC TROMETHAMINE 30 MG/ML IJ SOLN
30.0000 mg | Freq: Once | INTRAMUSCULAR | Status: AC
  Administered 2018-11-29: 03:00:00 30 mg via INTRAMUSCULAR
  Filled 2018-11-29: qty 1

## 2018-11-29 NOTE — Discharge Instructions (Addendum)
You were seen today for high blood pressure.  Your work-up is reassuring.  Start Norvasc and follow-up closely with your primary physician.

## 2018-11-29 NOTE — ED Provider Notes (Signed)
[facility]ENC[facility] Provider Note   CSN: [id number] Arrival date & time: 11/28/18  1640     History   Chief Complaint Chief Complaint  Patient presents with   Hypertension    HPI Alexandria Rhodes is a 43 y.o. female.     HPI  This a 43 year old female with a history of PE, cardiomyopathy, hypertension who presents with headache and high blood pressure.  She reports that she normally gets a headache when her blood pressures are high.  She has not had her blood pressure medication in several months.  She states that over the last several days she has had frontal headache which is consistent with her prior headaches from her blood pressure.  She noted her blood pressure to be 170s over 110s.  She reports she used to take carvedilol and Spironolactone.  [place] and does not have a current cardiologist or primary care physician.  She denies any chest pain.  She denies any strokelike symptoms, weakness, numbness, vision changes.  She does report feeling nauseous.  Past Medical History:  Diagnosis Date   Acute pulmonary embolism (HCC)    Cardiomyopathy (HCC)    CHF (congestive heart failure) (HCC)    Hypertension     Patient Active Problem List   Diagnosis Date Noted   Urinary tract infection with hematuria 04/06/2018   Cough 04/06/2018   Acute pulmonary embolism (HCC) 03/27/2018   Chronic systolic CHF (congestive heart failure) (HCC) 03/27/2018   HTN (hypertension) 03/27/2018    History reviewed. No pertinent surgical history.   OB History    Gravida      Para      Term      Preterm      AB      Living  0     SAB      TAB      Ectopic      Multiple      Live Births               Home Medications    Prior to Admission medications   Medication Sig Start Date End Date Taking? Authorizing Provider  amLODipine (NORVASC) 5 MG tablet Take 1 tablet (5 mg total) by mouth daily. 11/29/18   Maysa Lynn,  Mayer Maskerourtney F, MD  apixaban (ELIQUIS) 5 MG TABS tablet Take 1 tablet (5 mg total) by mouth 2 (two) times daily. Patient not taking: Reported on 06/06/2018 04/06/18   Kallie LocksStroud, Natalie M, FNP  carvedilol (COREG) 6.25 MG tablet Take 1 tablet (6.25 mg total) by mouth 2 (two) times daily with a meal. 06/06/18   Kallie LocksStroud, Natalie M, FNP  ondansetron (ZOFRAN) 4 MG tablet Take 1 tablet (4 mg total) by mouth every 8 (eight) hours as needed for nausea or vomiting. Patient not taking: Reported on 06/06/2018 04/06/18   Kallie LocksStroud, Natalie M, FNP  sacubitril-valsartan (ENTRESTO) 97-103 MG Take 1 tablet by mouth 2 (two) times daily.    [provider]  spironolactone (ALDACTONE) 25 MG tablet Take 12.5 mg by mouth daily.    [provider]  SUMAtriptan (IMITREX) 50 MG tablet Take 50 mg by mouth every 2 (two) hours as needed for migraine. May repeat in 2 hours if headache persists or recurs.    [provider]    Family History No family history on file.  Social History Social History   Tobacco Use   Smoking status: Never Smoker   Smokeless tobacco: Never Used  Substance Use Topics   Alcohol use: Not Currently   Drug use: Not Currently     Allergies   Shellfish allergy   Review of Systems Review of Systems  Constitutional: Negative for fever.  Respiratory: Negative for shortness of breath.   Cardiovascular: Negative for chest pain.  Gastrointestinal: Positive for nausea. Negative for abdominal pain.  Neurological: Positive for headaches. Negative for dizziness, weakness and numbness.  All other systems reviewed and are negative.    Physical Exam Updated Vital Signs BP (!) 159/77 (BP Location: Left Arm)    Pulse (!) 48    Temp 98 F (36.7 C) (Oral)    Resp 19    LMP 03/27/2014 (Approximate)    SpO2 100%   Physical Exam Vitals signs and nursing note reviewed.  Constitutional:      Appearance: She is well-developed.  HENT:     Head: Normocephalic and atraumatic.      Mouth/Throat:     Mouth: Mucous membranes are moist.  Eyes:     Pupils: Pupils are equal, round, and reactive to light.  Neck:     Musculoskeletal: Normal range of motion and neck supple.  Cardiovascular:     Rate and Rhythm: Normal rate and regular rhythm.     Heart sounds: Normal heart sounds.  Pulmonary:     Effort: Pulmonary effort is normal. No respiratory distress.     Breath sounds: No wheezing.  Abdominal:     General: Bowel sounds are normal.     Palpations: Abdomen is soft.  Skin:    General: Skin is warm and dry.  Neurological:     Mental Status: She is alert and oriented to person, place, and time.     Comments: Cranial nerves II through XII intact, 5 out of 5 in all 4 extremities, no dysmetria to finger-nose-finger  Psychiatric:        Mood and Affect: Mood normal.      ED Treatments / Results  Labs (all labs ordered are listed, but only abnormal results are displayed) Labs Reviewed  CBC - Abnormal; Notable for the following components:      Result Value   WBC 3.8 (*)    All other components within normal limits  COMPREHENSIVE METABOLIC PANEL - Abnormal; Notable for the following components:   Glucose, Bld 109 (*)    All other components within normal limits  SARS CORONAVIRUS 2 (TAT 6-24 HRS)  TROPONIN I (HIGH SENSITIVITY)  TROPONIN I (HIGH SENSITIVITY)    EKG None  Radiology No results found.  Procedures Procedures (including critical care time)  Medications Ordered in ED Medications  amLODipine (NORVASC) tablet 5 mg (5 mg Oral Given 11/29/18 0244)  ketorolac (TORADOL) 30 MG/ML injection 30 mg (30 mg Intramuscular Given 11/29/18 0244)     Initial Impression / Assessment and Plan / ED Course  I have reviewed the triage vital signs and the nursing notes.  Pertinent labs & imaging results that were available during my care of the patient were reviewed by me and considered in my medical decision making (see chart for details).  Clinical Course as  of Nov 29 323  Tue Nov 29, 2018  0867 Patient improved after Toradol and Norvasc.  Repeat blood pressure 140s over 70s.  Patient reassured.  Will start on Norvasc.  Heart rate too low to reinitiate carvedilol.  Recommend PCP follow-up.  Of note, at discharge, patient reported that she has lost her sense of taste and smell.  She has  no upper respiratory symptoms and no shortness of breath.  No known COVID contacts.  She reports that she has been masking appropriately.  COVID test was sent and is pending.  She was encouraged to self isolate until testing is complete.   [CH]    Clinical Course User Index [CH] Jahrell Hamor, Barbette Hair, MD       Patient presents with concern for high blood pressure and headache.  She is overall nontoxic-appearing and vital signs notable for initial blood pressure 150/84.  She is neurologically intact.  Report headache is normal for her when she has high blood pressure.  She has no signs or symptoms of hypertensive urgency or emergency.  No indication for CT imaging at this time.  Lab work obtained and largely reassuring.  See clinical course above.  Will initiate on Norvasc.  Previously was on carvedilol but with a pulse rate of 48, do not feel this is an appropriate medication at this time.  Follow-up closely with PCP.  After history, exam, and medical workup I feel the patient has been appropriately medically screened and is safe for discharge home. Pertinent diagnoses were discussed with the patient. Patient was given return precautions.   Final Clinical Impressions(s) / ED Diagnoses   Final diagnoses:  Essential hypertension  Generalized headache    ED Discharge Orders         Ordered    amLODipine (NORVASC) 5 MG tablet  Daily     11/29/18 0317           Merryl Hacker, MD 11/29/18 [phone]

## 2018-12-07 ENCOUNTER — Ambulatory Visit: Payer: No Typology Code available for payment source | Admitting: Family Medicine

## 2018-12-13 ENCOUNTER — Ambulatory Visit: Payer: No Typology Code available for payment source | Admitting: Family Medicine

## 2019-07-15 DIAGNOSIS — I428 Other cardiomyopathies: Secondary | ICD-10-CM

## 2019-07-15 HISTORY — DX: Other cardiomyopathies: I42.8

## 2019-07-20 ENCOUNTER — Encounter (HOSPITAL_COMMUNITY): Payer: Self-pay | Admitting: Emergency Medicine

## 2019-07-20 ENCOUNTER — Inpatient Hospital Stay (HOSPITAL_COMMUNITY)
Admission: EM | Admit: 2019-07-20 | Discharge: 2019-07-22 | DRG: 293 | Disposition: A | Discharge status: Home / Self Care | Payer: Self-pay | Attending: Internal Medicine | Admitting: Internal Medicine

## 2019-07-20 ENCOUNTER — Emergency Department (HOSPITAL_COMMUNITY): Payer: Self-pay

## 2019-07-20 ENCOUNTER — Other Ambulatory Visit: Payer: Self-pay

## 2019-07-20 ENCOUNTER — Inpatient Hospital Stay (HOSPITAL_COMMUNITY): Payer: Self-pay

## 2019-07-20 DIAGNOSIS — I10 Essential (primary) hypertension: Secondary | ICD-10-CM | POA: Diagnosis present

## 2019-07-20 DIAGNOSIS — Z8249 Family history of ischemic heart disease and other diseases of the circulatory system: Secondary | ICD-10-CM

## 2019-07-20 DIAGNOSIS — Z20822 Contact with and (suspected) exposure to covid-19: Secondary | ICD-10-CM | POA: Diagnosis present

## 2019-07-20 DIAGNOSIS — Z91013 Allergy to seafood: Secondary | ICD-10-CM

## 2019-07-20 DIAGNOSIS — I088 Other rheumatic multiple valve diseases: Secondary | ICD-10-CM | POA: Diagnosis present

## 2019-07-20 DIAGNOSIS — I509 Heart failure, unspecified: Secondary | ICD-10-CM

## 2019-07-20 DIAGNOSIS — D649 Anemia, unspecified: Secondary | ICD-10-CM | POA: Diagnosis present

## 2019-07-20 DIAGNOSIS — I5043 Acute on chronic combined systolic (congestive) and diastolic (congestive) heart failure: Secondary | ICD-10-CM | POA: Diagnosis present

## 2019-07-20 DIAGNOSIS — I5033 Acute on chronic diastolic (congestive) heart failure: Secondary | ICD-10-CM

## 2019-07-20 DIAGNOSIS — Z86711 Personal history of pulmonary embolism: Secondary | ICD-10-CM

## 2019-07-20 DIAGNOSIS — Z91120 Patient's intentional underdosing of medication regimen due to financial hardship: Secondary | ICD-10-CM

## 2019-07-20 DIAGNOSIS — E876 Hypokalemia: Secondary | ICD-10-CM | POA: Diagnosis present

## 2019-07-20 DIAGNOSIS — I110 Hypertensive heart disease with heart failure: Principal | ICD-10-CM | POA: Diagnosis present

## 2019-07-20 DIAGNOSIS — T45516A Underdosing of anticoagulants, initial encounter: Secondary | ICD-10-CM | POA: Diagnosis present

## 2019-07-20 DIAGNOSIS — Z87891 Personal history of nicotine dependence: Secondary | ICD-10-CM

## 2019-07-20 DIAGNOSIS — I428 Other cardiomyopathies: Secondary | ICD-10-CM | POA: Diagnosis present

## 2019-07-20 LAB — BASIC METABOLIC PANEL
Anion gap: 10 (ref 5–15)
BUN: 10 mg/dL (ref 6–20)
CO2: 20 mmol/L — ABNORMAL LOW (ref 22–32)
Calcium: 9.1 mg/dL (ref 8.9–10.3)
Chloride: 111 mmol/L (ref 98–111)
Creatinine, Ser: 0.79 mg/dL (ref 0.44–1.00)
GFR calc Af Amer: >60 mL/min (ref >60)
GFR calc non Af Amer: >60 mL/min (ref >60)
Glucose, Bld: 100 mg/dL — ABNORMAL HIGH (ref 70–99)
Potassium: 3.2 mmol/L — ABNORMAL LOW (ref 3.5–5.1)
Sodium: 141 mmol/L (ref 135–145)

## 2019-07-20 LAB — ECHOCARDIOGRAM COMPLETE: Height: 61 in

## 2019-07-20 LAB — CBC
HCT: 36.6 % (ref 36.0–46.0)
Hemoglobin: 11.4 g/dL — ABNORMAL LOW (ref 12.0–15.0)
MCH: 28.1 pg (ref 26.0–34.0)
MCHC: 31.1 g/dL (ref 30.0–36.0)
MCV: 90.1 fL (ref 80.0–100.0)
Platelets: 284 10*3/uL (ref 150–400)
RBC: 4.06 MIL/uL (ref 3.87–5.11)
RDW: 13.9 % (ref 11.5–15.5)
WBC: 6 10*3/uL (ref 4.0–10.5)
nRBC: 0 % (ref 0.0–0.2)

## 2019-07-20 LAB — TROPONIN I (HIGH SENSITIVITY)
Troponin I (High Sensitivity): 31 ng/L — ABNORMAL HIGH (ref <18)
Troponin I (High Sensitivity): 30 ng/L — ABNORMAL HIGH (ref <18)

## 2019-07-20 LAB — HEPATIC FUNCTION PANEL
ALT: 17 U/L (ref 0–44)
AST: 19 U/L (ref 15–41)
Albumin: 3.4 g/dL — ABNORMAL LOW (ref 3.5–5.0)
Alkaline Phosphatase: 59 U/L (ref 38–126)
Bilirubin, Direct: 0.2 mg/dL (ref 0.0–0.2)
Indirect Bilirubin: 0.8 mg/dL (ref 0.3–0.9)
Total Bilirubin: 1 mg/dL (ref 0.3–1.2)
Total Protein: 7.1 g/dL (ref 6.5–8.1)

## 2019-07-20 LAB — RESPIRATORY PANEL BY RT PCR (FLU A&B, COVID)
Influenza A by PCR: NEGATIVE
Influenza B by PCR: NEGATIVE
SARS Coronavirus 2 by RT PCR: NEGATIVE

## 2019-07-20 LAB — HCG, SERUM, QUALITATIVE: Preg, Serum: NEGATIVE

## 2019-07-20 LAB — HIV ANTIBODY (ROUTINE TESTING W REFLEX): HIV Screen 4th Generation wRfx: NONREACTIVE

## 2019-07-20 LAB — I-STAT BETA HCG BLOOD, ED (MC, WL, AP ONLY): I-stat hCG, quantitative: 9.8 m[IU]/mL — ABNORMAL HIGH (ref <5)

## 2019-07-20 LAB — BRAIN NATRIURETIC PEPTIDE: B Natriuretic Peptide: 1054.8 pg/mL — ABNORMAL HIGH (ref 0.0–100.0)

## 2019-07-20 LAB — PREGNANCY, URINE: Preg Test, Ur: NEGATIVE

## 2019-07-20 MED ORDER — SODIUM CHLORIDE 0.9% FLUSH: 3.0000 mL | INTRAVENOUS | Status: DC | PRN

## 2019-07-20 MED ORDER — ALBUTEROL SULFATE (2.5 MG/3ML) 0.083% IN NEBU: 2.5000 mg | INHALATION_SOLUTION | Freq: Four times a day (QID) | RESPIRATORY_TRACT | Status: DC | PRN

## 2019-07-20 MED ORDER — ALBUTEROL SULFATE (2.5 MG/3ML) 0.083% IN NEBU
2.5000 mg | INHALATION_SOLUTION | Freq: Once | RESPIRATORY_TRACT | Status: AC
  Administered 2019-07-20: 2.5 mg via RESPIRATORY_TRACT
  Filled 2019-07-20: qty 3

## 2019-07-20 MED ORDER — SPIRONOLACTONE 12.5 MG HALF TABLET
12.5000 mg | ORAL_TABLET | Freq: Every day | ORAL | Status: DC
  Administered 2019-07-20 – 2019-07-22 (×3): 12.5 mg via ORAL
  Filled 2019-07-20 (×3): qty 1

## 2019-07-20 MED ORDER — SODIUM CHLORIDE 0.9% FLUSH
3.0000 mL | Freq: Once | INTRAVENOUS | Status: AC
  Administered 2019-07-20: 3 mL via INTRAVENOUS

## 2019-07-20 MED ORDER — SODIUM CHLORIDE 0.9% FLUSH
3.0000 mL | Freq: Two times a day (BID) | INTRAVENOUS | Status: DC
  Administered 2019-07-20 – 2019-07-22 (×4): 3 mL via INTRAVENOUS

## 2019-07-20 MED ORDER — POTASSIUM CHLORIDE CRYS ER 20 MEQ PO TBCR
40.0000 meq | EXTENDED_RELEASE_TABLET | ORAL | Status: AC
  Administered 2019-07-20: 40 meq via ORAL
  Filled 2019-07-20: qty 2

## 2019-07-20 MED ORDER — CARVEDILOL 3.125 MG PO TABS
3.1250 mg | ORAL_TABLET | Freq: Two times a day (BID) | ORAL | Status: DC
  Administered 2019-07-20: 3.125 mg via ORAL
  Filled 2019-07-20: qty 1

## 2019-07-20 MED ORDER — IOHEXOL 350 MG/ML SOLN
75.0000 mL | Freq: Once | INTRAVENOUS | Status: AC | PRN
  Administered 2019-07-20: 75 mL via INTRAVENOUS

## 2019-07-20 MED ORDER — ACETAMINOPHEN 325 MG PO TABS
650.0000 mg | ORAL_TABLET | ORAL | Status: DC | PRN
  Administered 2019-07-20 – 2019-07-21 (×4): 650 mg via ORAL
  Filled 2019-07-20 (×4): qty 2

## 2019-07-20 MED ORDER — FUROSEMIDE 10 MG/ML IJ SOLN
40.0000 mg | Freq: Two times a day (BID) | INTRAMUSCULAR | Status: DC
  Administered 2019-07-20 – 2019-07-22 (×4): 40 mg via INTRAVENOUS
  Filled 2019-07-20 (×4): qty 4

## 2019-07-20 MED ORDER — ONDANSETRON HCL 4 MG/2ML IJ SOLN
4.0000 mg | Freq: Four times a day (QID) | INTRAMUSCULAR | Status: DC | PRN
  Administered 2019-07-20 – 2019-07-21 (×2): 4 mg via INTRAVENOUS
  Filled 2019-07-20 (×3): qty 2

## 2019-07-20 MED ORDER — LOSARTAN POTASSIUM 25 MG PO TABS
25.0000 mg | ORAL_TABLET | Freq: Every day | ORAL | Status: DC
  Administered 2019-07-20 – 2019-07-21 (×2): 25 mg via ORAL
  Filled 2019-07-20 (×2): qty 1

## 2019-07-20 MED ORDER — FUROSEMIDE 10 MG/ML IJ SOLN
40.0000 mg | Freq: Once | INTRAMUSCULAR | Status: AC
  Administered 2019-07-20: 40 mg via INTRAVENOUS
  Filled 2019-07-20: qty 4

## 2019-07-20 MED ORDER — POTASSIUM CHLORIDE CRYS ER 20 MEQ PO TBCR
40.0000 meq | EXTENDED_RELEASE_TABLET | Freq: Every day | ORAL | Status: DC
  Administered 2019-07-21 – 2019-07-22 (×2): 40 meq via ORAL
  Filled 2019-07-20 (×2): qty 2

## 2019-07-20 MED ORDER — DIGOXIN 125 MCG PO TABS
0.1250 mg | ORAL_TABLET | Freq: Every day | ORAL | Status: DC
  Administered 2019-07-20 – 2019-07-22 (×3): 0.125 mg via ORAL
  Filled 2019-07-20 (×3): qty 1

## 2019-07-20 MED ORDER — SODIUM CHLORIDE 0.9 % IV SOLN: 250.0000 mL | INTRAVENOUS | Status: DC | PRN

## 2019-07-20 MED ORDER — ENOXAPARIN SODIUM 40 MG/0.4ML ~~LOC~~ SOLN
40.0000 mg | Freq: Every day | SUBCUTANEOUS | Status: DC
  Administered 2019-07-20 – 2019-07-22 (×3): 40 mg via SUBCUTANEOUS
  Filled 2019-07-20 (×3): qty 0.4

## 2019-07-20 NOTE — ED Provider Notes (Signed)
[facility] EMERGENCY DEPARTMENT Provider Note  CSN: [id number] Arrival date & time: 07/20/19 0225  Chief Complaint(s) Shortness of Breath and Cough  HPI Teshia Mahone is a 44 y.o. female    Shortness of Breath Severity:  Moderate Onset quality:  Gradual Duration:  1 month Timing:  Constant Progression:  Waxing and waning Chronicity:  Recurrent Relieved by:  Rest Worsened by:  Activity Associated symptoms: cough and wheezing   Associated symptoms: no chest pain, no fever and no sputum production   Associated symptoms comment:  Orthopnea Risk factors: hx of PE/DVT   Cough Associated symptoms: shortness of breath and wheezing   Associated symptoms: no chest pain and no fever     Has been off of all her medication for 1 year, including anticoagulation.   Past Medical History Past Medical History:  Diagnosis Date  . Acute pulmonary embolism (HCC)   . Cardiomyopathy (HCC)   . CHF (congestive heart failure) (HCC)   . Hypertension    Patient Active Problem List   Diagnosis Date Noted  . Urinary tract infection with hematuria 04/06/2018  . Cough 04/06/2018  . Acute pulmonary embolism (HCC) 03/27/2018  . Chronic systolic CHF (congestive heart failure) (HCC) 03/27/2018  . HTN (hypertension) 03/27/2018   Home Medication(s) Prior to Admission medications   Medication Sig Start Date End Date Taking? Authorizing Provider  acetaminophen (TYLENOL) 500 MG tablet Take 1,000-1,500 mg by mouth 2 (two) times daily as needed for mild pain or headache.   Yes [provider]  SUMAtriptan (IMITREX) 50 MG tablet Take 50 mg by mouth every 2 (two) hours as needed for migraine. May repeat in 2 hours if headache persists or recurs.   Yes [provider]  amLODipine (NORVASC) 5 MG tablet Take 1 tablet (5 mg total) by mouth daily. Patient not taking: Reported on 07/20/2019 11/29/18   Horton, Mayer Masker, MD  apixaban (ELIQUIS) 5 MG TABS tablet Take 1 tablet (5  mg total) by mouth 2 (two) times daily. Patient not taking: Reported on 06/06/2018 04/06/18   Kallie Locks, FNP  carvedilol (COREG) 6.25 MG tablet Take 1 tablet (6.25 mg total) by mouth 2 (two) times daily with a meal. Patient not taking: Reported on 07/20/2019 06/06/18   Kallie Locks, FNP  ondansetron (ZOFRAN) 4 MG tablet Take 1 tablet (4 mg total) by mouth every 8 (eight) hours as needed for nausea or vomiting. Patient not taking: Reported on 06/06/2018 04/06/18   Kallie Locks, FNP                                                                                                                                    Past Surgical History History reviewed. No pertinent surgical history. Family History No family history on file.  Social History Social History   Tobacco Use  . Smoking status: Never Smoker  . Smokeless tobacco: Never Used  Substance  Use Topics  . Alcohol use: Not Currently  . Drug use: Not Currently   Allergies Shellfish allergy  Review of Systems Review of Systems  Constitutional: Negative for fever.  Respiratory: Positive for cough, shortness of breath and wheezing. Negative for sputum production.   Cardiovascular: Negative for chest pain.   All other systems are reviewed and are negative for acute change except as noted in the HPI  Physical Exam Vital Signs  I have reviewed the triage vital signs BP (!) 135/103   Pulse 68   Temp 98.8 F (37.1 C) (Oral)   Resp 19   Ht 5\' 1"  (1.549 m)   LMP 03/27/2014 (Approximate)   SpO2 100%   BMI 35.33 kg/m   Physical Exam Vitals reviewed.  Constitutional:      General: She is not in acute distress.    Appearance: She is well-developed. She is not diaphoretic.  HENT:     Head: Normocephalic and atraumatic.     Nose: Nose normal.  Eyes:     General: No scleral icterus.       Right eye: No discharge.        Left eye: No discharge.     Conjunctiva/sclera: Conjunctivae normal.     Pupils: Pupils are equal,  round, and reactive to light.  Cardiovascular:     Rate and Rhythm: Normal rate and regular rhythm.     Heart sounds: No murmur. No friction rub. No gallop.   Pulmonary:     Effort: Pulmonary effort is normal. No respiratory distress.     Breath sounds: Normal breath sounds. No stridor. No rales.  Abdominal:     General: There is no distension.     Palpations: Abdomen is soft.     Tenderness: There is no abdominal tenderness.  Musculoskeletal:        General: No tenderness.     Cervical back: Normal range of motion and neck supple.     Right lower leg: No edema.     Left lower leg: No edema.  Skin:    General: Skin is warm and dry.     Findings: No erythema or rash.  Neurological:     Mental Status: She is alert and oriented to person, place, and time.     ED Results and Treatments Labs (all labs ordered are listed, but only abnormal results are displayed) Labs Reviewed  BASIC METABOLIC PANEL - Abnormal; Notable for the following components:      Result Value   Potassium 3.2 (*)    CO2 20 (*)    Glucose, Bld 100 (*)    All other components within normal limits  CBC - Abnormal; Notable for the following components:   Hemoglobin 11.4 (*)    All other components within normal limits  BRAIN NATRIURETIC PEPTIDE - Abnormal; Notable for the following components:   B Natriuretic Peptide 1,054.8 (*)    All other components within normal limits  HEPATIC FUNCTION PANEL - Abnormal; Notable for the following components:   Albumin 3.4 (*)    All other components within normal limits  I-STAT BETA HCG BLOOD, ED ([facility], [facility], [facility] ONLY) - Abnormal; Notable for the following components:   I-stat hCG, quantitative 9.8 (*)    All other components within normal limits  TROPONIN I (HIGH SENSITIVITY) - Abnormal; Notable for the following components:   Troponin I (High Sensitivity) 31 (*)    All other components within normal limits  TROPONIN I (HIGH SENSITIVITY) - Abnormal;  Notable for the  following components:   Troponin I (High Sensitivity) 30 (*)    All other components within normal limits  RESPIRATORY PANEL BY RT PCR (FLU A&B, COVID)  PREGNANCY, URINE                                                                                                                         EKG  EKG Interpretation  Date/Time:  Thursday Jul 20 2019 02:33:50 EDT Ventricular Rate:  102 PR Interval:  188 QRS Duration: 96 QT Interval:  360 QTC Calculation: 469 R Axis:   24 Text Interpretation: Sinus tachycardia Cannot rule out Anterior infarct , age undetermined Abnormal ECG Otherwise no significant change Confirmed by Drema Pry [phone]) on 07/20/2019 4:40:32 AM      Radiology DG Chest 2 View  Result Date: 07/20/2019 CLINICAL DATA:  44 year old female with shortness of breath. EXAM: CHEST - 2 VIEW COMPARISON:  Chest radiograph dated 03/27/2018 FINDINGS: There is mild cardiomegaly and mild vascular congestion. No focal consolidation, pleural effusion or pneumothorax. No acute osseous pathology. IMPRESSION: Cardiomegaly with mild vascular congestion. No focal consolidation. Electronically Signed   By: Elgie Collard M.D.   On: 07/20/2019 03:16   CT Angio Chest PE W and/or Wo Contrast  Result Date: 07/20/2019 CLINICAL DATA:  One month of shortness of breath EXAM: CT ANGIOGRAPHY CHEST WITH CONTRAST TECHNIQUE: Multidetector CT imaging of the chest was performed using the standard protocol during bolus administration of intravenous contrast. Multiplanar CT image reconstructions and MIPs were obtained to evaluate the vascular anatomy. CONTRAST:  62mL OMNIPAQUE IOHEXOL 350 MG/ML SOLN COMPARISON:  CT 03/27/2018 FINDINGS: Cardiovascular: Satisfactory opacification the pulmonary arteries to the segmental level. No pulmonary artery filling defects are identified. Central pulmonary arteries are normal caliber. Cardiomegaly with predominantly left atrial ventricular enlargement is similar to comparison  study. Extensive reflux of contrast into the IVC and hepatic veins. The aorta is essentially non-opacified. Normal caliber aorta with normal branching of the great vessels. No periaortic abnormality is clearly evident. There is some central pulmonary venous congestion and redistribution. Mediastinum/Nodes: Fatty stippling in the anterior mediastinum is stable from priors. No worrisome mediastinal, hilar or axillary adenopathy though difficult to fully assess given the timing of contrast. Thyroid gland and thoracic inlet is unremarkable. No acute abnormality of the trachea or esophagus. Lungs/Pleura: There is interlobular septal thickening as well as fissural thickening throughout both lungs. Peribronchovascular cuffing is noted as well. Some perihilar and basilar predominant regions of ground-glass opacity are noted with more mosaic attenuation possibly reflecting atelectasis secondary to imaging during exhalation or possible small airways disease/air trapping. No visible effusion. No pneumothorax. No suspicious nodules or masses within the limitations of respiratory motion artifact. Upper Abdomen: No acute abnormalities present in the visualized portions of the upper abdomen. Musculoskeletal: No acute osseous abnormality or suspicious osseous lesion. Multilevel degenerative changes are present in the imaged portions of the spine. No worrisome chest wall lesions. Review of the MIP images confirms  the above findings. IMPRESSION: 1. No acute pulmonary artery filling defects to suggest pulmonary embolism. 2. Cardiomegaly with predominantly left atrioventricular enlargement 3. Features of CHF with reflux of contrast into the IVC and hepatic veins, consistent with elevated right heart pressures, redistributed pulmonary venous congestion, and features of interstitial edema within the lungs. 4. Dependent and perihilar ground-glass opacities are compatible with pulmonary edema though atypical infection is difficult to  exclude. Electronically Signed   By: Lovena Le M.D.   On: 07/20/2019 05:55    Pertinent labs & imaging results that were available during my care of the patient were reviewed by me and considered in my medical decision making (see chart for details).  Medications Ordered in ED Medications  furosemide (LASIX) injection 40 mg (has no administration in time range)  sodium chloride flush (NS) 0.9 % injection 3 mL (3 mLs Intravenous Given 07/20/19 0444)  iohexol (OMNIPAQUE) 350 MG/ML injection 75 mL (75 mLs Intravenous Contrast Given 07/20/19 0540)                                                                                                                                    Procedures Procedures  (including critical care time)  Medical Decision Making / ED Course I have reviewed the nursing notes for this encounter and the patient's prior records (if available in EHR or on provided paperwork).   Mia Milan was evaluated in Emergency Department on 07/20/2019 for the symptoms described in the history of present illness. She was evaluated in the context of the global COVID-19 pandemic, which necessitated consideration that the patient might be at risk for infection with the SARS-CoV-2 virus that causes COVID-19. Institutional protocols and algorithms that pertain to the evaluation of patients at risk for COVID-19 are in a state of rapid change based on information released by regulatory bodies including the CDC and federal and state organizations. These policies and algorithms were followed during the patient's care in the ED.  1 month of gradually worsening shortness of breath with dyspnea on exertion and orthopnea.  No evidence of volume overload on exam patient does have a history of cardiomyopathy.  She has not been on medication for 1 year.  Patient also has a history of PEs.  Work-up consistent with heart failure exacerbation with cardiomegaly, perivascular congestion and evidence of mild  pulmonary edema on CT scan.  No PEs.  BNP elevated.  Patient also has mildly elevated troponin.  Patient provided with IV Lasix.  Will require admission for further management.      Final Clinical Impression(s) / ED Diagnoses Final diagnoses:  Acute on chronic diastolic congestive heart failure ([place])      This chart was dictated using voice recognition software.  Despite best efforts to proofread,  errors can occur which can change the documentation meaning.   Fatima Blank, MD 07/20/19 [phone]

## 2019-07-20 NOTE — Social Work (Signed)
CSW acknowledging consult for access to medications at discharge.  RN Case Management and TOC team will evaluate for [facility] program eligibility for medication assistance.   Octavio Graves, MSW, LCSW [facility] Clinical Social Work

## 2019-07-20 NOTE — ED Triage Notes (Signed)
Pt has been having sob, worse on exertion for the past month. Denies chest pain or weight gain. Reports wheezing has started more recently. Pt has been off of all medication for the past year due to lack of insurance and cost of medications.

## 2019-07-20 NOTE — Consult Note (Addendum)
Cardiology Consultation:   Patient ID: Alexandria Rhodes MRN: [id number]; DOB: [birth year]  Admit date: 07/20/2019 Date of Consult: 07/20/2019  Primary Care Provider: Kallie Locks, FNP Primary Cardiologist: New to [facility] (Dr. Gala Romney) Primary Electrophysiologist:  None    Patient Profile:   Alexandria Rhodes is a 44 y.o. female with a history of non-ischemic cardiomyopathy with EF as low as 10% in 2016 but most recent  65-70% on Echo in 03/2018, prior PE in 03/2018 no longer on anticoagulation, and hypertension who is being seen today for the evaluation of acute on chronic diastolic CHF at the request of Dr. Katrinka Blazing  History of Present Illness:   Alexandria Rhodes is a 44 year old female with the above history. Patient originally from [place]. She was admitted to a hospital in [place] in 08/2014 after presenting with chest pain and shortness of breath.  She was found to have an EF of 10%. She states she underwent heart cath which showed no CAD. She was followed by Dr. Antionette Char at [facility] in [place].  She was started on guideline medical therapy and was on Entresto 97-103 mg twice daily,  Coreg 6.25 mg twice daily, and Spironolactone 12.5 mg daily.  She moved to [place] at the end of 2019. She was admitted to [facility] in 03/2018 with chest pain and found to have an acute PE felt to be provoked by recent move from [place]. Echo at that time showed LVEF of 65-70% with normal wall motion, grade 1 diastolic dysfunction, trivial MR, normal biatrial size, normal RV function, and dilated IVC.  She was started on Eliquis but was only able to complete 1 month of therapy due to cost.  She also could not afford her heart failure medications and has not been on anything in the past year.    Patient presented to the ED today via EMS for evaluation of shortness of breath and cough.  Patient notes worsening dyspnea on exertion, orthopnea, and cough/wheezing over the past month.  She  denies any PND until last night when she woke up suddenly.  When she tried to to walk to the bathroom, she became significantly short of breath and this is when she called 911.  She denies any lower extremity edema, abdominal distention, or weight gain.  She states she is actually lost about 40 pounds over the past year (unintentionally).  She notes rare episodes of very brief chest pain that she describes as tightness/sharp pain that makes it difficult for her to brief.  These episodes only last for a few seconds and then resolve.  She estimates she has 3-4 of these episodes every year.  Otherwise, no chest pain.  No palpitations, lightheadedness, dizziness, syncope.  She did have Covid in 11/2018 but had no symptoms with this other than being unable to taste or smell.  No respiratory symptoms with this.  No other recent fevers or illnesses.  No abnormal bleeding or bruising.  Upon arrival to the ED, patient mildly hypertensive but vitals stable. EKG showed no acute ST/T changes. High-sensitivity troponin minimally elevated and flat at 31 >> 30. BNP elevated at 1,054. Chest x-ray showed cardiomegaly with mild vascular congestion. Chest CTA negative for PED but showed features of CHF with reflux contrast into the IVC and hepatic veins consistent with elevated right heart pressure, redistributed pulmonary venous congestion, and features of interstitial edema within the lung. Also noted to have dependent and perihilar ground glass opacities compatible with  pulmonary edema. WBC 6.0, Hgb 11.4, Plts 284. Na 141, K 3.2, Glucose 100, BUN 10, Cr 0.79. Albumin slightly low at 3.4 but LFTs normal. Patient started on IV Lasix and admitted. Cardiology consulted for further evaluation.   At the time of this evaluation, patient resting comfortably.  She does not note much improvement with IV Lasix at this time.  She reports previous smoker social smoking history for about 5 years but states she quit before her  hospitalization in 2016.  She also uses socially drink but also quit drinking in 2016.  No recreational drug use.  She does have a family history of CHF on her mom's side.  3 of her maternal cousins all had advanced CHF diagnosed in their 28s.  1 of these cousins died in their 77s.  No CHF in mom or maternal grandparents.  No other known family history of heart disease.   Past Medical History:  Diagnosis Date  . Acute pulmonary embolism (HCC)   . Cardiomyopathy (HCC)   . CHF (congestive heart failure) (HCC)   . Hypertension     History reviewed. No pertinent surgical history.   Home Medications:  Prior to Admission medications   Medication Sig Start Date End Date Taking? Authorizing Provider  acetaminophen (TYLENOL) 500 MG tablet Take 1,000-1,500 mg by mouth 2 (two) times daily as needed for mild pain or headache.   Yes [provider]  SUMAtriptan (IMITREX) 50 MG tablet Take 50 mg by mouth every 2 (two) hours as needed for migraine. May repeat in 2 hours if headache persists or recurs.   Yes [provider]  amLODipine (NORVASC) 5 MG tablet Take 1 tablet (5 mg total) by mouth daily. Patient not taking: Reported on 07/20/2019 11/29/18   Horton, Mayer Masker, MD  apixaban (ELIQUIS) 5 MG TABS tablet Take 1 tablet (5 mg total) by mouth 2 (two) times daily. Patient not taking: Reported on 06/06/2018 04/06/18   Kallie Locks, FNP  carvedilol (COREG) 6.25 MG tablet Take 1 tablet (6.25 mg total) by mouth 2 (two) times daily with a meal. Patient not taking: Reported on 07/20/2019 06/06/18   Kallie Locks, FNP  ondansetron (ZOFRAN) 4 MG tablet Take 1 tablet (4 mg total) by mouth every 8 (eight) hours as needed for nausea or vomiting. Patient not taking: Reported on 06/06/2018 04/06/18   Kallie Locks, FNP    Inpatient Medications: Scheduled Meds: . carvedilol  3.125 mg Oral BID WC  . enoxaparin (LOVENOX) injection  40 mg Subcutaneous Daily  . furosemide  40 mg Intravenous BID   . [START ON 07/21/2019] potassium chloride  40 mEq Oral Daily  . sodium chloride flush  3 mL Intravenous Q12H   Continuous Infusions: . sodium chloride     PRN Meds: sodium chloride, acetaminophen, albuterol, ondansetron (ZOFRAN) IV, sodium chloride flush  Allergies:    Allergies  Allergen Reactions  . Shellfish Allergy Anaphylaxis and Hives    Social History:   Social History   Socioeconomic History  . Marital status: Single    Spouse name: Not on file  . Number of children: Not on file  . Years of education: Not on file  . Highest education level: Not on file  Occupational History  . Not on file  Tobacco Use  . Smoking status: Never Smoker  . Smokeless tobacco: Never Used  Substance and Sexual Activity  . Alcohol use: Not Currently  . Drug use: Not Currently  . Sexual activity: Yes  Other Topics Concern  . Not on file  Social History Narrative  . Not on file   Social Determinants of Health   Financial Resource Strain:   . Difficulty of Paying Living Expenses:   Food Insecurity:   . Worried About Programme researcher, broadcasting/film/video in the Last Year:   . Barista in the Last Year:   Transportation Needs:   . Freight forwarder (Medical):   Marland Kitchen Lack of Transportation (Non-Medical):   Physical Activity:   . Days of Exercise per Week:   . Minutes of Exercise per Session:   Stress:   . Feeling of Stress :   Social Connections:   . Frequency of Communication with Friends and Family:   . Frequency of Social Gatherings with Friends and Family:   . Attends Religious Services:   . Active Member of Clubs or Organizations:   . Attends Banker Meetings:   Marland Kitchen Marital Status:   Intimate Partner Violence:   . Fear of Current or Ex-Partner:   . Emotionally Abused:   Marland Kitchen Physically Abused:   . Sexually Abused:     Family History:    Family History  Problem Relation Age of Onset  . Hypertension Mother      ROS:  Please see the history of present illness.    All other ROS reviewed and negative.     Physical Exam/Data:   Vitals:   07/20/19 0900 07/20/19 1314 07/20/19 1345 07/20/19 1607  BP: (!) 126/99 (!) 125/98 (!) 127/94   Pulse: 93 97 98 (!) 107  Resp: (!) 21 18 18 18   Temp:  98.6 F (37 C) 98.4 F (36.9 C)   TempSrc:  Oral Oral   SpO2: 98% 100% 99% 97%  Height:        Intake/Output Summary (Last 24 hours) at 07/20/2019 1621 Last data filed at 07/20/2019 0911 Gross per 24 hour  Intake --  Output 1300 ml  Net -1300 ml   Last 3 Weights 06/06/2018 04/06/2018 03/28/2018  Weight (lbs) 187 lb 181 lb 190 lb 4.8 oz  Weight (kg) 84.823 kg 82.101 kg 86.32 kg     Body mass index is 35.33 kg/m.  General: 44 y.o. African-American female resting comfortably in no acute distress. HEENT: Normocephalic and atraumatic. Sclera clear.  Neck: Supple. No carotid bruits. No JVD appreciated. Heart: Mildly tachycardic with regular rhythm. Distinct S1 and S2. No murmurs, gallops, or rubs. Radial and distal pedal pulses 2+ and equal bilaterally. Lungs: No increased work of breathing. Scattered wheezing noted but no significant crackles or rhonchi appreciated.  Abdomen: Soft, non-distended, and non-tender to palpation. Bowel sounds present. MSK: Normal strength and tone for age. Extremities: No lower extremity edema.    Skin: Warm and dry. Neuro: Alert and oriented x3. No focal deficits. Psych: Normal affect. Responds appropriately.   EKG:  The EKG was personally reviewed and demonstrates:  Sinus tachycardia, rate 102 bpm, with minimally J point elevation in V1 and V2, minimally ST depression in lead II and V4-V6, an diffuse non-specific T wave flattening. Normal axis. Normal PR interval and QRS interval. QTc 469 ms.  Telemetry:  Telemetry was personally reviewed and demonstrates:  Sinus rhythm with rates in the 80's to 120's.   Relevant CV Studies:  Echocardiogram 07/20/2019: Impressions: 1. Left ventricular ejection fraction, by estimation, is 20 to  25%. The  left ventricle has severely decreased function. The left ventricle  demonstrates global hypokinesis. The left ventricular internal cavity  size  was moderately dilated. Left  ventricular diastolic parameters are consistent with Grade II diastolic  dysfunction (pseudonormalization).  2. Right ventricular systolic function is mildly reduced. The right  ventricular size is normal. There is moderately elevated pulmonary artery  systolic pressure. The estimated right ventricular systolic pressure is  45.0 mmHg.  3. Left atrial size was severely dilated.  4. The mitral valve is normal in structure. Moderate mitral valve  regurgitation.  5. The aortic valve is tricuspid. Aortic valve regurgitation is trivial.  No aortic stenosis is present.  6. The inferior vena cava is dilated in size with <50% respiratory  variability, suggesting right atrial pressure of 15 mmHg.   Laboratory Data:  High Sensitivity Troponin:   Recent Labs  Lab 07/20/19 0250 07/20/19 0443  TROPONINIHS 31* 30*     Chemistry Recent Labs  Lab 07/20/19 0250  NA 141  K 3.2*  CL 111  CO2 20*  GLUCOSE 100*  BUN 10  CREATININE 0.79  CALCIUM 9.1  GFRNONAA >60  GFRAA >60  ANIONGAP 10    Recent Labs  Lab 07/20/19 0443  PROT 7.1  ALBUMIN 3.4*  AST 19  ALT 17  ALKPHOS 59  BILITOT 1.0   Hematology Recent Labs  Lab 07/20/19 0250  WBC 6.0  RBC 4.06  HGB 11.4*  HCT 36.6  MCV 90.1  MCH 28.1  MCHC 31.1  RDW 13.9  PLT 284   BNP Recent Labs  Lab 07/20/19 0443  BNP 1,054.8*    DDimer No results for input(s): DDIMER in the last 168 hours.   Radiology/Studies:  DG Chest 2 View  Result Date: 07/20/2019 CLINICAL DATA:  44 year old female with shortness of breath. EXAM: CHEST - 2 VIEW COMPARISON:  Chest radiograph dated 03/27/2018 FINDINGS: There is mild cardiomegaly and mild vascular congestion. No focal consolidation, pleural effusion or pneumothorax. No acute osseous pathology.  IMPRESSION: Cardiomegaly with mild vascular congestion. No focal consolidation. Electronically Signed   By: Elgie Collard M.D.   On: 07/20/2019 03:16   CT Angio Chest PE W and/or Wo Contrast  Result Date: 07/20/2019 CLINICAL DATA:  One month of shortness of breath EXAM: CT ANGIOGRAPHY CHEST WITH CONTRAST TECHNIQUE: Multidetector CT imaging of the chest was performed using the standard protocol during bolus administration of intravenous contrast. Multiplanar CT image reconstructions and MIPs were obtained to evaluate the vascular anatomy. CONTRAST:  73mL OMNIPAQUE IOHEXOL 350 MG/ML SOLN COMPARISON:  CT 03/27/2018 FINDINGS: Cardiovascular: Satisfactory opacification the pulmonary arteries to the segmental level. No pulmonary artery filling defects are identified. Central pulmonary arteries are normal caliber. Cardiomegaly with predominantly left atrial ventricular enlargement is similar to comparison study. Extensive reflux of contrast into the IVC and hepatic veins. The aorta is essentially non-opacified. Normal caliber aorta with normal branching of the great vessels. No periaortic abnormality is clearly evident. There is some central pulmonary venous congestion and redistribution. Mediastinum/Nodes: Fatty stippling in the anterior mediastinum is stable from priors. No worrisome mediastinal, hilar or axillary adenopathy though difficult to fully assess given the timing of contrast. Thyroid gland and thoracic inlet is unremarkable. No acute abnormality of the trachea or esophagus. Lungs/Pleura: There is interlobular septal thickening as well as fissural thickening throughout both lungs. Peribronchovascular cuffing is noted as well. Some perihilar and basilar predominant regions of ground-glass opacity are noted with more mosaic attenuation possibly reflecting atelectasis secondary to imaging during exhalation or possible small airways disease/air trapping. No visible effusion. No pneumothorax. No suspicious  nodules or masses within  the limitations of respiratory motion artifact. Upper Abdomen: No acute abnormalities present in the visualized portions of the upper abdomen. Musculoskeletal: No acute osseous abnormality or suspicious osseous lesion. Multilevel degenerative changes are present in the imaged portions of the spine. No worrisome chest wall lesions. Review of the MIP images confirms the above findings. IMPRESSION: 1. No acute pulmonary artery filling defects to suggest pulmonary embolism. 2. Cardiomegaly with predominantly left atrioventricular enlargement 3. Features of CHF with reflux of contrast into the IVC and hepatic veins, consistent with elevated right heart pressures, redistributed pulmonary venous congestion, and features of interstitial edema within the lungs. 4. Dependent and perihilar ground-glass opacities are compatible with pulmonary edema though atypical infection is difficult to exclude. Electronically Signed   By: Lovena Le M.D.   On: 07/20/2019 05:55   ECHOCARDIOGRAM COMPLETE  Result Date: 07/20/2019    ECHOCARDIOGRAM REPORT   Patient Name:   Alexandria Rhodes Date of Exam: 07/20/2019 Medical Rec #:  [id number]      Height:       61.0 in Accession #:    [id number]     Weight:       187.0 lb Date of Birth:  Aug 18, 1975       BSA:          1.836 m Patient Age:    49 years       BP:           126/99 mmHg Patient Gender: F              HR:           93 bpm. Exam Location:  Inpatient Procedure: 2D Echo, Cardiac Doppler and Color Doppler Indications:    CHF  History:        Patient has prior history of Echocardiogram examinations, most                 recent 03/28/2018. CHF and Cardiomyopathy, Pul.embolus,                 Signs/Symptoms:Shortness of Breath; Risk Factors:Hypertension.  Sonographer:    Dustin Flock Referring Phys: [phone] [place]  1. Left ventricular ejection fraction, by estimation, is 20 to 25%. The left ventricle has severely decreased function. The left  ventricle demonstrates global hypokinesis. The left ventricular internal cavity size was moderately dilated. Left ventricular diastolic parameters are consistent with Grade II diastolic dysfunction (pseudonormalization).  2. Right ventricular systolic function is mildly reduced. The right ventricular size is normal. There is moderately elevated pulmonary artery systolic pressure. The estimated right ventricular systolic pressure is 67.1 mmHg.  3. Left atrial size was severely dilated.  4. The mitral valve is normal in structure. Moderate mitral valve regurgitation.  5. The aortic valve is tricuspid. Aortic valve regurgitation is trivial. No aortic stenosis is present.  6. The inferior vena cava is dilated in size with <50% respiratory variability, suggesting right atrial pressure of 15 mmHg. FINDINGS  Left Ventricle: Left ventricular ejection fraction, by estimation, is 20 to 25%. The left ventricle has severely decreased function. The left ventricle demonstrates global hypokinesis. The left ventricular internal cavity size was moderately dilated. There is no left ventricular hypertrophy. Left ventricular diastolic parameters are consistent with Grade II diastolic dysfunction (pseudonormalization). Right Ventricle: The right ventricular size is normal. No increase in right ventricular wall thickness. Right ventricular systolic function is mildly reduced. There is moderately elevated pulmonary artery systolic pressure. The tricuspid regurgitant velocity is 2.74 m/s, and  with an assumed right atrial pressure of 15 mmHg, the estimated right ventricular systolic pressure is 45.0 mmHg. Left Atrium: Left atrial size was severely dilated. Right Atrium: Right atrial size was normal in size. Pericardium: There is no evidence of pericardial effusion. Mitral Valve: The mitral valve is normal in structure. Moderate mitral valve regurgitation. Tricuspid Valve: The tricuspid valve is normal in structure. Tricuspid valve  regurgitation is trivial. Aortic Valve: The aortic valve is tricuspid. Aortic valve regurgitation is trivial. Aortic regurgitation PHT measures 611 msec. No aortic stenosis is present. Pulmonic Valve: The pulmonic valve was normal in structure. Pulmonic valve regurgitation is trivial. Aorta: The aortic root is normal in size and structure. Venous: The inferior vena cava is dilated in size with less than 50% respiratory variability, suggesting right atrial pressure of 15 mmHg. IAS/Shunts: No atrial level shunt detected by color flow Doppler.  LEFT VENTRICLE PLAX 2D LVIDd:         6.52 cm      Diastology LVIDs:         5.82 cm      LV e' lateral:   5.06 cm/s LV PW:         0.92 cm      LV E/e' lateral: 23.5 LV IVS:        0.87 cm      LV e' medial:    4.81 cm/s LVOT diam:     2.30 cm      LV E/e' medial:  24.7 LV SV:         71 LV SV Index:   38 LVOT Area:     4.15 cm  LV Volumes (MOD) LV vol d, MOD A4C: 286.0 ml LV vol s, MOD A4C: 204.0 ml LV SV MOD A4C:     286.0 ml RIGHT VENTRICLE RV Basal diam:  2.91 cm RV S prime:     9.34 cm/s TAPSE (M-mode): 2.6 cm LEFT ATRIUM              Index       RIGHT ATRIUM           Index LA diam:        4.70 cm  2.56 cm/m  RA Area:     14.40 cm LA Vol (A2C):   106.0 ml 57.75 ml/m RA Volume:   32.30 ml  17.60 ml/m LA Vol (A4C):   97.3 ml  53.01 ml/m LA Biplane Vol: 106.0 ml 57.75 ml/m  AORTIC VALVE LVOT Vmax:   107.00 cm/s LVOT Vmean:  61.600 cm/s LVOT VTI:    0.170 m AI PHT:      611 msec  AORTA Ao Root diam: 2.60 cm MITRAL VALVE                TRICUSPID VALVE MV Area (PHT): 7.59 cm     TR Peak grad:   30.0 mmHg MV Decel Time: 100 msec     TR Vmax:        274.00 cm/s MV E velocity: 119.00 cm/s MV A velocity: 74.40 cm/s   SHUNTS MV E/A ratio:  1.60         Systemic VTI:  0.17 m                             Systemic Diam: 2.30 cm Marca Ancona MD Electronically signed by Marca Ancona MD Signature Date/Time: 07/20/2019/4:10:24 PM    Final  Assessment and Plan:   Acute on  Chronic Combined CHF / Non-Ischemic Cardiomyopathy - Patient presented with worsening dyspnea on exertion, orthopnea, cough/wheeze over the past month.  She has known nonischemic cardiomyopathy with EF as low as 10% back in 2016.  She was started on guideline based medications and EF improved to 65-70% on echo in 03/2018.  Unfortunately, since that time she had to stop all of her heart failure medications because she was unable to afford them. - Chest CTA showed features of CHF with reflux contrast into the IVC and hepatic veins consistent with elevated right heart pressure, redistributed pulmonary venous congestion, and features of interstitial edema within the lung. - BNP elevated at 1,054.  - Echo showed LVEF of 20-25% with global hypokinesis, grade II diastolic dysfunction, severely dilated left atrium, moderate MR, and trivial AI. RV size normal with systolic function mildly reduced.  - Continue IV Lasix 40mg  twice daily.  - Will stop Coreg for now as patient is in decompensated CHF. However, will likely restart prior to discharge. - Start Losartan 25 mg daily, Spironolactone 12.5mg  daily, and Digoxin 0.125mg  daily.  Will likely transition to [facility] prior to discharge BP in renal function allow.  She was previously on high-dose Entresto. - Etiology possibly genetic or hypertensive cardiomyopathy.  Ischemic cardiomyopathy unlikely given reports of normal cath back in 2016.  No evidence evidence of left bundle branch block or tachyarrhythmia.  She did have Covid in 11/2018; however, she she had known nonischemic cardiomyopathy prior to that.  Plan is for cardiac MRI tomorrow. - Monitor daily weights, strict I's and O's, and renal function. - Will make sure Care Management is involved prior to discharge to help with medications. Looks like primary team already placed Transition of Care consult.   Elevated Troponin - High-sensitivity troponin minimally elevated and flat at 31 >>32.  - No angina.  -  Suspect demand ischemia in the setting of acute CHF.  No plans for ischemic evaluation at this time.  Hypertension - BP elevated.  Diastolic BP worse than systolic BP. - Continue medications as above.  Hypokalemia - Potassium 3.2 on presentation.  - Started on K-Dur 40 mEq daily.  May be able to stop this given addition of spironolactone. - Continue to monitor.   History of PE - Diagnosed with PE in 03/2018.  Only completed 1 month of anticoagulation with Eliquis given cost.  However, chest CTA on admission negative for PE.  Normocytic Anemia - Hemoglobin 11.4.  - Primary team checking iron studies.  - Management per primary team.    For questions or updates, please contact [facility] Please consult [email or web address] for contact info under     Signed, 04/2018, PA-C  07/20/2019 4:21 PM   Patient seen and examined with the above-signed Advanced Practice Provider and/or Housestaff. I personally reviewed laboratory data, imaging studies and relevant notes. I independently examined the patient and formulated the important aspects of the plan. I have edited the note to reflect any of my changes or salient points. I have personally discussed the plan with the patient and/or family.  44 y/o with h/o severe HTN, NICM and PE admitted with recurrent systolic HF. Had previous w/u in 58 with normal coronaries. EF normalized with medical therapy. Unfortunately has been out of meds for month and now EF back down to 20%. CT with no PE but evidence of volume overload. End-organ function ok.   General:  Sitting up in bed. No resp difficulty HEENT:  normal Neck: supple. JVP 8. Carotids 2+ bilat; no bruits. No lymphadenopathy or thryomegaly appreciated. Cor: PMI nondisplaced. Regular rate & rhythm. +s3. Lungs: clear Abdomen: soft, nontender, + mildly distended. No hepatosplenomegaly. No bruits or masses. Good bowel sounds. Extremities: no cyanosis, clubbing, rash, edema Neuro: alert &  orientedx3, cranial nerves grossly intact. moves all 4 extremities w/o difficulty. 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 Wil continue IV diuretics. Re-initiate GDMT. Plan cMRI tomorrow. Will not plan RHC for now unless she shows evidence of low output. Eventually will need genetic screening.   Arvilla Meres, MD  11:16 PM

## 2019-07-20 NOTE — Progress Notes (Signed)
  Echocardiogram 2D Echocardiogram has been performed.  Alexandria Rhodes 07/20/2019, 11:28 AM

## 2019-07-20 NOTE — ED Notes (Signed)
Patient assisted to BR.

## 2019-07-20 NOTE — H&P (Signed)
History and Physical    Alexandria Rhodes WER:[id number] DOB: 01-19-1976 DOA: 07/20/2019  Referring MD/NP/PA: Addison Lank, MD PCP: Azzie Glatter, FNP  Patient coming from: Home via EMS  Chief Complaint: Shortness of breath    I have personally briefly reviewed patient's old medical records in [place]   HPI: Alexandria Rhodes is a 44 y.o. female with medical history significant of nonischemic cardiomyopathy with EF 65-70% with grade 1 diastolic dysfunction in 03/9507 and PE no longer on anticoagulation presents with waxing and waning complaints of shortness of breath over the last 1 month.   She reports having a intermittently minimally productive cough with clear sputum and wheezing.  Reports having worsening shortness of breath with exertion.  Denies having any leg swelling, calf pain, nausea, vomiting, diarrhea, or dysuria symptoms.  Last night she was unable to sleep and was awakened out of her sleep due to difficulty breathing.  She reports remote history of smoking for less than 5 years, does not drink alcohol, or use any illicit drugs.  Her family history is significant for hypertension and heart failure on her mother side.  Patient was last hospitalized in January 2020 after presenting with chest pain found to have a pulmonary embolus.  The pulmonary embolus was suspected to be provoked by moving from [place].  She was placed on Eliquis, but was only able to take the medications for 1 month due to financial issues.  Over the last year patient has not been on any medications.   ED Course: Upon admission into the emergency department patient was noted to be afebrile, pulse 68 104, blood pressures 135/103-148/112, and O2 saturations maintained currently on room air.  Labs significant for hemoglobin 11.4, potassium 3.2, BNP 1054.8, troponin 31->30.  CT angiogram of the chest significant for cardiomegaly with pulmonary edema and no signs of pulmonary embolus.  Patient was given 40 mg of  Lasix IV.  TRH called to admit.  Review of Systems  Constitutional: Positive for malaise/fatigue.  HENT: Positive for congestion.   Eyes: Negative for photophobia and pain.  Respiratory: Positive for cough, sputum production, shortness of breath and wheezing.   Cardiovascular: Positive for orthopnea. Negative for chest pain and leg swelling.  Gastrointestinal: Negative for abdominal pain, nausea and vomiting.  Genitourinary: Negative for dysuria and hematuria.  Musculoskeletal: Negative for joint pain and myalgias.  Neurological: Negative for focal weakness and seizures.  Psychiatric/Behavioral: Negative for substance abuse. The patient has insomnia.     Past Medical History:  Diagnosis Date  . Acute pulmonary embolism ([place])   . Cardiomyopathy ([place])   . CHF (congestive heart failure) ([place])   . Hypertension     History reviewed. No pertinent surgical history.   reports that she has never smoked. She has never used smokeless tobacco. She reports previous alcohol use. She reports previous drug use.  Allergies  Allergen Reactions  . Shellfish Allergy Anaphylaxis and Hives    History reviewed. No pertinent family history.  Prior to Admission medications   Medication Sig Start Date End Date Taking? Authorizing Provider  acetaminophen (TYLENOL) 500 MG tablet Take 1,000-1,500 mg by mouth 2 (two) times daily as needed for mild pain or headache.   Yes [provider]  SUMAtriptan (IMITREX) 50 MG tablet Take 50 mg by mouth every 2 (two) hours as needed for migraine. May repeat in 2 hours if headache persists or recurs.   Yes [provider]  amLODipine (NORVASC) 5 MG tablet Take 1 tablet (  5 mg total) by mouth daily. Patient not taking: Reported on 07/20/2019 11/29/18   Horton, Mayer Masker, MD  apixaban (ELIQUIS) 5 MG TABS tablet Take 1 tablet (5 mg total) by mouth 2 (two) times daily. Patient not taking: Reported on 06/06/2018 04/06/18   Kallie Locks, FNP  carvedilol  (COREG) 6.25 MG tablet Take 1 tablet (6.25 mg total) by mouth 2 (two) times daily with a meal. Patient not taking: Reported on 07/20/2019 06/06/18   Kallie Locks, FNP  ondansetron (ZOFRAN) 4 MG tablet Take 1 tablet (4 mg total) by mouth every 8 (eight) hours as needed for nausea or vomiting. Patient not taking: Reported on 06/06/2018 04/06/18   Kallie Locks, FNP    Physical Exam:  Constitutional: Elderly female currently in no acute distress Vitals:   07/20/19 0243 07/20/19 0445 07/20/19 0636 07/20/19 0730  BP:  (!) 135/103 (!) 138/113 (!) 138/115  Pulse:  68 95 86  Resp:  19 20 15   Temp:      TempSrc:      SpO2:  100% 97% 98%  Height: 5\' 1"  (1.549 m)      Eyes: PERRL, lids and conjunctivae normal ENMT: Mucous membranes are moist. Posterior pharynx clear of any exudate or lesions.  Neck: normal, supple, no masses, no thyromegaly.  JVD present Respiratory: Normal respiratory effort with crackles heard in the mid to lower lung fields with mild expiratory wheeze.  Patient's O2 saturation maintained on room air. Cardiovascular: Regular rate and rhythm, positive systolic murmur.. No extremity edema. 2+ pedal pulses. No carotid bruits.  Abdomen: no tenderness, no masses palpated. No hepatosplenomegaly. Bowel sounds positive.  Musculoskeletal: no clubbing / cyanosis. No joint deformity upper and lower extremities. Good ROM, no contractures. Normal muscle tone.  Skin: no rashes, lesions, ulcers. No induration Neurologic: CN 2-12 grossly intact. Sensation intact, DTR normal. Strength 5/5 in all 4.  Psychiatric: Normal judgment and insight. Alert and oriented x 3. Normal mood.     Labs on Admission: I have personally reviewed following labs and imaging studies  CBC: Recent Labs  Lab 07/20/19 0250  WBC 6.0  HGB 11.4*  HCT 36.6  MCV 90.1  PLT 284   Basic Metabolic Panel: Recent Labs  Lab 07/20/19 0250  NA 141  K 3.2*  CL 111  CO2 20*  GLUCOSE 100*  BUN 10  CREATININE  0.79  CALCIUM 9.1   GFR: CrCl cannot be calculated (Unknown ideal weight.). Liver Function Tests: Recent Labs  Lab 07/20/19 0443  AST 19  ALT 17  ALKPHOS 59  BILITOT 1.0  PROT 7.1  ALBUMIN 3.4*   No results for input(s): LIPASE, AMYLASE in the last 168 hours. No results for input(s): AMMONIA in the last 168 hours. Coagulation Profile: No results for input(s): INR, PROTIME in the last 168 hours. Cardiac Enzymes: No results for input(s): CKTOTAL, CKMB, CKMBINDEX, TROPONINI in the last 168 hours. BNP (last 3 results) No results for input(s): PROBNP in the last 8760 hours. HbA1C: No results for input(s): HGBA1C in the last 72 hours. CBG: No results for input(s): GLUCAP in the last 168 hours. Lipid Profile: No results for input(s): CHOL, HDL, LDLCALC, TRIG, CHOLHDL, LDLDIRECT in the last 72 hours. Thyroid Function Tests: No results for input(s): TSH, T4TOTAL, FREET4, T3FREE, THYROIDAB in the last 72 hours. Anemia Panel: No results for input(s): VITAMINB12, FOLATE, FERRITIN, TIBC, IRON, RETICCTPCT in the last 72 hours. Urine analysis:    Component Value Date/Time   BILIRUBINUR negative  06/06/2018 1329   KETONESUR trace (5) (A) 06/06/2018 1329   PROTEINUR negative 06/06/2018 1329   UROBILINOGEN 0.2 06/06/2018 1329   NITRITE Negative 06/06/2018 1329   LEUKOCYTESUR Negative 06/06/2018 1329   Sepsis Labs: No results found for this or any previous visit (from the past 240 hour(s)).   Radiological Exams on Admission: DG Chest 2 View  Result Date: 07/20/2019 CLINICAL DATA:  44 year old female with shortness of breath. EXAM: CHEST - 2 VIEW COMPARISON:  Chest radiograph dated 03/27/2018 FINDINGS: There is mild cardiomegaly and mild vascular congestion. No focal consolidation, pleural effusion or pneumothorax. No acute osseous pathology. IMPRESSION: Cardiomegaly with mild vascular congestion. No focal consolidation. Electronically Signed   By: Elgie Collard M.D.   On: 07/20/2019  03:16   CT Angio Chest PE W and/or Wo Contrast  Result Date: 07/20/2019 CLINICAL DATA:  One month of shortness of breath EXAM: CT ANGIOGRAPHY CHEST WITH CONTRAST TECHNIQUE: Multidetector CT imaging of the chest was performed using the standard protocol during bolus administration of intravenous contrast. Multiplanar CT image reconstructions and MIPs were obtained to evaluate the vascular anatomy. CONTRAST:  40mL OMNIPAQUE IOHEXOL 350 MG/ML SOLN COMPARISON:  CT 03/27/2018 FINDINGS: Cardiovascular: Satisfactory opacification the pulmonary arteries to the segmental level. No pulmonary artery filling defects are identified. Central pulmonary arteries are normal caliber. Cardiomegaly with predominantly left atrial ventricular enlargement is similar to comparison study. Extensive reflux of contrast into the IVC and hepatic veins. The aorta is essentially non-opacified. Normal caliber aorta with normal branching of the great vessels. No periaortic abnormality is clearly evident. There is some central pulmonary venous congestion and redistribution. Mediastinum/Nodes: Fatty stippling in the anterior mediastinum is stable from priors. No worrisome mediastinal, hilar or axillary adenopathy though difficult to fully assess given the timing of contrast. Thyroid gland and thoracic inlet is unremarkable. No acute abnormality of the trachea or esophagus. Lungs/Pleura: There is interlobular septal thickening as well as fissural thickening throughout both lungs. Peribronchovascular cuffing is noted as well. Some perihilar and basilar predominant regions of ground-glass opacity are noted with more mosaic attenuation possibly reflecting atelectasis secondary to imaging during exhalation or possible small airways disease/air trapping. No visible effusion. No pneumothorax. No suspicious nodules or masses within the limitations of respiratory motion artifact. Upper Abdomen: No acute abnormalities present in the visualized portions of  the upper abdomen. Musculoskeletal: No acute osseous abnormality or suspicious osseous lesion. Multilevel degenerative changes are present in the imaged portions of the spine. No worrisome chest wall lesions. Review of the MIP images confirms the above findings. IMPRESSION: 1. No acute pulmonary artery filling defects to suggest pulmonary embolism. 2. Cardiomegaly with predominantly left atrioventricular enlargement 3. Features of CHF with reflux of contrast into the IVC and hepatic veins, consistent with elevated right heart pressures, redistributed pulmonary venous congestion, and features of interstitial edema within the lungs. 4. Dependent and perihilar ground-glass opacities are compatible with pulmonary edema though atypical infection is difficult to exclude. Electronically Signed   By: Kreg Shropshire M.D.   On: 07/20/2019 05:55    EKG: Independently reviewed.  Sinus tachycardia 102 bpm  Assessment/Plan Diastolic congestive heart failure: Acute on chronic.  Patient presents with shortness of breath minimally productive cough.  CT angiogram significant for cardiomegaly with HTN assistant for interstitial edema.  BNP elevated at 1054.8.  Patient was started on Lasix IV.  Last echocardiogram revealed EF of 65 to 70% with grade 1 diastolic dysfunction back in 03/2018.  Patient does not have a cardiologist in  which she follows. -Admit to a cardiac telemetry bed -Heart failure orders set  initiated  -Continuous pulse oximetry with nasal cannula oxygen as needed to keep O2 saturations >92% -Strict I&Os and daily weights -Elevate lower extremities -Lasix 40 mg IV Bid -Start low-dose Coreg 3.25 mg twice daily -Reassess in a.m. and adjust diuresis as needed. -Check echocardiogram -Cardiology formally consulted -Ttransitions of care consult  Hypokalemia: Acute. initial potassium noted to be 3.2. -Give 40 mEq of potassium chloride daily -Adjust dosage as needed  Essential hypertension: On admission  blood pressures elevated up to 138/115.  Patient not on any blood pressure medications at home. -Coreg 3.25 mg twice daily  History of PE: For 73month due to financial issues.  Per review of records it appears that patient has pulmonary embolus that was found back in 2020 was likely provoked with recent travel to [place].  Normocytic anemia: Chronic.  Hemoglobin 11.4.  Patient reports that her menstrual periods stopped when she was 38. -Check iron studies   DVT prophylaxis:   Lovenox Code Status: Full Family Communication: No family requested to be updated Disposition Plan: Likely discharge home in 2 to 3 days Consults called: Cardiology Admission status: Inpatient  Clydie Braun MD [facility] Pager [phone]   If 7PM-7AM, please contact night-coverage [email or web address] Password [facility]  07/20/2019, 7:35 AM

## 2019-07-20 NOTE — ED Notes (Signed)
Lunch Tray Ordered @ 1042. 

## 2019-07-20 NOTE — ED Triage Notes (Signed)
Per EMS, pt from home, c/o SOB and cough X28month.  Had COVID in September 2020. Has been off her HNT meds for a year due to finances.  Denies any Chest pain, n/v/d/fevers.  After discussion she states she is having some left flank pain.  Hx of PE

## 2019-07-21 ENCOUNTER — Inpatient Hospital Stay (HOSPITAL_COMMUNITY): Payer: Self-pay

## 2019-07-21 DIAGNOSIS — I10 Essential (primary) hypertension: Secondary | ICD-10-CM

## 2019-07-21 DIAGNOSIS — I5043 Acute on chronic combined systolic (congestive) and diastolic (congestive) heart failure: Secondary | ICD-10-CM

## 2019-07-21 DIAGNOSIS — I5089 Other heart failure: Secondary | ICD-10-CM

## 2019-07-21 LAB — FERRITIN: Ferritin: 21 ng/mL (ref 11–307)

## 2019-07-21 LAB — CBC
HCT: 40.3 % (ref 36.0–46.0)
Hemoglobin: 12.7 g/dL (ref 12.0–15.0)
MCH: 27.8 pg (ref 26.0–34.0)
MCHC: 31.5 g/dL (ref 30.0–36.0)
MCV: 88.2 fL (ref 80.0–100.0)
Platelets: 295 10*3/uL (ref 150–400)
RBC: 4.57 MIL/uL (ref 3.87–5.11)
RDW: 13.8 % (ref 11.5–15.5)
WBC: 9.5 10*3/uL (ref 4.0–10.5)
nRBC: 0 % (ref 0.0–0.2)

## 2019-07-21 LAB — BASIC METABOLIC PANEL
Anion gap: 13 (ref 5–15)
BUN: 11 mg/dL (ref 6–20)
CO2: 25 mmol/L (ref 22–32)
Calcium: 9.7 mg/dL (ref 8.9–10.3)
Chloride: 102 mmol/L (ref 98–111)
Creatinine, Ser: 0.9 mg/dL (ref 0.44–1.00)
GFR calc Af Amer: >60 mL/min (ref >60)
GFR calc non Af Amer: >60 mL/min (ref >60)
Glucose, Bld: 124 mg/dL — ABNORMAL HIGH (ref 70–99)
Potassium: 3.8 mmol/L (ref 3.5–5.1)
Sodium: 140 mmol/L (ref 135–145)

## 2019-07-21 LAB — IRON AND TIBC
Iron: 38 ug/dL (ref 28–170)
Saturation Ratios: 10 % — ABNORMAL LOW (ref 10.4–31.8)
TIBC: 378 ug/dL (ref 250–450)
UIBC: 340 ug/dL

## 2019-07-21 LAB — MAGNESIUM: Magnesium: 1.9 mg/dL (ref 1.7–2.4)

## 2019-07-21 LAB — GLUCOSE, CAPILLARY: Glucose-Capillary: 120 mg/dL — ABNORMAL HIGH (ref 70–99)

## 2019-07-21 MED ORDER — PROMETHAZINE HCL 25 MG/ML IJ SOLN
6.2500 mg | Freq: Once | INTRAMUSCULAR | Status: AC
  Administered 2019-07-21: 6.25 mg via INTRAVENOUS
  Filled 2019-07-21: qty 1

## 2019-07-21 MED ORDER — GADOBUTROL 1 MMOL/ML IV SOLN
8.0000 mL | Freq: Once | INTRAVENOUS | Status: AC | PRN
  Administered 2019-07-21: 8 mL via INTRAVENOUS

## 2019-07-21 MED ORDER — SACUBITRIL-VALSARTAN 24-26 MG PO TABS
1.0000 | ORAL_TABLET | Freq: Two times a day (BID) | ORAL | Status: DC
  Administered 2019-07-21 – 2019-07-22 (×2): 1 via ORAL
  Filled 2019-07-21 (×3): qty 1

## 2019-07-21 NOTE — Progress Notes (Addendum)
Advanced Heart Failure Rounding Note  PCP-Cardiologist: No primary care provider on file.   Subjective:    Yesterday diuresed with IV lasix. Negative 3 liters. Started back losartan and spironolactone.   Feeling better today. Denies SOB.    Objective:   Weight Range: 71.1 kg Body mass index is 29.63 kg/m.   Vital Signs:   Temp:  [98 F (36.7 C)-100 F (37.8 C)] 98 F (36.7 C) (05/07 1154) Pulse Rate:  [62-107] 81 (05/07 1154) Resp:  [17-19] 18 (05/07 1154) BP: (114-131)/(71-98) 114/71 (05/07 1154) SpO2:  [95 %-100 %] 96 % (05/07 1154) Weight:  [71.1 kg] 71.1 kg (05/07 0500) Last BM Date: 07/19/19  Weight change: Filed Weights   07/20/19 0745 07/21/19 0500  Weight: 72.6 kg 71.1 kg    Intake/Output:   Intake/Output Summary (Last 24 hours) at 07/21/2019 1235 Last data filed at 07/21/2019 1209 Gross per 24 hour  Intake 840 ml  Output 2400 ml  Net -1560 ml      Physical Exam    General:   No resp difficulty HEENT: Normal Neck: Supple. JVP 6-7 . Carotids 2+ bilat; no bruits. No lymphadenopathy or thyromegaly appreciated. Cor: PMI nondisplaced. Regular rate & rhythm. No rubs, gallops or murmurs. Lungs: Clear Abdomen: Soft, nontender, nondistended. No hepatosplenomegaly. No bruits or masses. Good bowel sounds. Extremities: No cyanosis, clubbing, rash, edema Neuro: Alert & orientedx3, cranial nerves grossly intact. moves all 4 extremities w/o difficulty. Affect pleasant   Telemetry   SR-ST 90-100s   EKG    n/a  Labs    CBC Recent Labs    07/20/19 0250 07/21/19 0615  WBC 6.0 9.5  HGB 11.4* 12.7  HCT 36.6 40.3  MCV 90.1 88.2  PLT 284 295   Basic Metabolic Panel Recent Labs    18/56/31 0250 07/21/19 0615  NA 141 140  K 3.2* 3.8  CL 111 102  CO2 20* 25  GLUCOSE 100* 124*  BUN 10 11  CREATININE 0.79 0.90  CALCIUM 9.1 9.7  MG  --  1.9   Liver Function Tests Recent Labs    07/20/19 0443  AST 19  ALT 17  ALKPHOS 59  BILITOT 1.0    PROT 7.1  ALBUMIN 3.4*   No results for input(s): LIPASE, AMYLASE in the last 72 hours. Cardiac Enzymes No results for input(s): CKTOTAL, CKMB, CKMBINDEX, TROPONINI in the last 72 hours.  BNP: BNP (last 3 results) Recent Labs    07/20/19 0443  BNP 1,054.8*    ProBNP (last 3 results) No results for input(s): PROBNP in the last 8760 hours.   D-Dimer No results for input(s): DDIMER in the last 72 hours. Hemoglobin A1C No results for input(s): HGBA1C in the last 72 hours. Fasting Lipid Panel No results for input(s): CHOL, HDL, LDLCALC, TRIG, CHOLHDL, LDLDIRECT in the last 72 hours. Thyroid Function Tests No results for input(s): TSH, T4TOTAL, T3FREE, THYROIDAB in the last 72 hours.  Invalid input(s): FREET3  Other results:   Imaging     No results found.   Medications:     Scheduled Medications: . digoxin  0.125 mg Oral Daily  . enoxaparin (LOVENOX) injection  40 mg Subcutaneous Daily  . furosemide  40 mg Intravenous BID  . losartan  25 mg Oral Daily  . potassium chloride  40 mEq Oral Daily  . sodium chloride flush  3 mL Intravenous Q12H  . spironolactone  12.5 mg Oral Daily     Infusions: . sodium chloride  PRN Medications:  sodium chloride, acetaminophen, albuterol, ondansetron (ZOFRAN) IV, sodium chloride flush    Patient Profile   Alexandria Rhodes is a 44 y.o. female with a history of non-ischemic cardiomyopathy with EF as low as 10% in 2016 but most recent  65-70% on Echo in 03/2018, prior PE in 03/2018 no longer on anticoagulation, and hypertension  HF Team consulted for acute on chronic diastolic CHF.   Assessment/Plan   1. Acute on Chronic Combined CHF / Non-Ischemic Cardiomyopathy NICM HTN +/- genetic  EF as low as 10% back in 2016.  She was started on guideline based medications and EF improved to 65-70% on echo in 03/2018.   -ECHO this admit  EF 20-25% with global hypokinesis, grade II diastolic dysfunction, severely dilated left  atrium, moderate MR, and trivial AI. RV size normal with systolic function mildly reduced. Volume status improved. Overall weight down 4 pounds. Stop IV lasix.  - Start lasix 40 mg po tomorrow.  - Stop losartan. Start entresto 24-26 mg twice a day.   - Off bb with decompensated CHF. Continue digoxin 0.125 mg daily.  -CMRI pending.  - Provided with scale and weight chart today.  - Renal function stable.  2.Elevated Troponin - High-sensitivity troponin minimally elevated and flat at 31 >>32.  - Suspect demand ischemia in the setting of acute CHF.  No plans for ischemic evaluation at this time. - No chest pain.   3. Hypertension -Improved but still high. Stop losartan and start entresto 24-26 mg twice a day.   4. Hypokalemia -K 3.8.   5. History of PE - Diagnosed with PE in 03/2018.  Only completed 1 month of anticoagulation with Eliquis given cost.  However, chest CTA on admission negative for PE.  6. Normocytic Anemia - Hemoglobin 11.4.  - Primary team - Iron sats 10%  - Management per primary team.   7. Social No medical insurance and has trouble with transportation.  - Referred to HF SW to follow at d/c appointments. She will need 30 day supply of HF meds prior to d/c .  HF F/U set up 07/31/19 at 1000  Length of Stay: [place], NP  07/21/2019, 12:35 PM  Advanced Heart Failure Team Pager [phone] (M-F; 7a - 4p)  Please contact [facility] for night-coverage after hours (4p -7a ) and weekends on [email or web address]  Patient seen and examined with the above-signed Advanced Practice Provider and/or Housestaff. I personally reviewed laboratory data, imaging studies and relevant notes. I independently examined the patient and formulated the important aspects of the plan. I have edited the note to reflect any of my changes or salient points. I have personally discussed the plan with the patient and/or family.  Much improved with diuresis. Denies SOB, orthopnea or PND. Less  tachycardic. BP ok.   General:  Sitting up in bed. No resp difficulty HEENT: normal Neck: supple. No obvious JVD. Carotids 2+ bilat; no bruits. No lymphadenopathy or thryomegaly appreciated. Cor: PMI nondisplaced. Regular rate & rhythm. No rubs, gallops or murmurs. Lungs: clear Abdomen: soft, nontender, nondistended. No hepatosplenomegaly. No bruits or masses. Good bowel sounds. Extremities: no cyanosis, clubbing, rash, edema Neuro: alert & orientedx3, cranial nerves grossly intact. moves all 4 extremities w/o difficulty. Affect pleasant  Volume status is better. Agree with titrating GDMT as above. Will get MRI results later today.   Have arranged f/u in HF Clinic. 30 day of meds provided. Likely home in am. D/w Dr. Eliseo Squires.  Glori Bickers, MD  3:25 PM

## 2019-07-21 NOTE — Progress Notes (Addendum)
Progress Note    Alexandria Rhodes  JXB:[id number] DOB: 1975-05-05  DOA: 07/20/2019 PCP: Kallie Locks, FNP    Brief Narrative:   Medical records reviewed and are as summarized below:  Alexandria Rhodes is an 44 y.o. female with a past medical history that includes nonischemic cardiomyopathy, PE, combined chronic CHF, noncompliance admitted May 6 with acute on chronic combined heart failure, chest pain with elevated troponins, later blood pressure and hypokalemia.  Provided with IV Lasix is already diuresed over 3 L.  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  Recommend vigorous diuresis  Assessment/Plan:   #1.  Acute on chronic combined congestive heart failure.  Related to uncontrolled hypertension and noncompliance with medications.  BNP 1054, CTA reveals features of CHF with reflux contrast into the IVC and hepatic veins consistent with elevated right heart pressure, echo reveals an EF of 20 to 25% with grade 2 diastolic dysfunction and right ventricular systolic function mildly reduced.  She is provided with IV Lasix and has diuresed over 3 L today.  Evaluated by cardiology who recommend cMRI and medication adjustment -Continue IV Lasix -Monitor intake and output -Obtain daily weight -Follow results cMRI -Further management per cardiology -Appreciate cardiology's assistance  #2.  Hypokalemia.  Mild.  Repleted and resolved. -Monitor closely specially given her heavy diuresis -Replete as indicated  #3.  Hypertension.  Blood pressure elevated on admission.  His include Norvasc, Coreg.  Suspect noncompliance with these medications as well.  Blood pressure control improved after adjustments with meds per cards -Medications adjusted per cardiology -Monitor  4.  History of PE. CT neg for PE.  Was on anticoagulation but not for a month due to finances.  Chart review indicates she had a PE in 2020 likely provoked with travel to [place]. -Resume  anticoagulation  #5. Normocytic anemia. Chronic. Hg appears stable. Iron 10% -consider iron supplement -monitor -fobt  #6. Nausea and vomiting. 2 episodes small amount emesis this am. Reports no nausea "until they started giving me all this medicine". Able to eat, but "it comes right back". Refusing cMRI initially due to emesis -low dose one time phenergan -clear liquids   Family Communication/Anticipated D/C date and plan/Code Status   DVT prophylaxis: Lovenox ordered. Code Status: Full Code.  Family Communication: patient at bedside and questions answered Disposition Plan: Status is: Inpatient  Remains inpatient appropriate because:Inpatient level of care appropriate due to severity of illness   Dispo: The patient is from: Home              Anticipated d/c is to: Home              Anticipated d/c date is: 2 days              Patient currently is not medically stable to d/c.due to need for aggressive diuresis    Medical Consultants:   CHF team    Subjective:   Lying in bed complaining of nausea/vomiting. Denies pain/discomfort  Objective:    Vitals:   07/21/19 0608 07/21/19 0834 07/21/19 1153 07/21/19 1154  BP: 122/78 131/90  114/71  Pulse: 62 77 75 81  Resp: Temp: 98.3 F (36.8 C) 98.6 F (37 C)  98 F (36.7 C)  TempSrc: Oral Oral  Oral  SpO2: 95% 96%  96%  Weight:      Height:        Intake/Output Summary (Last 24 hours) at 07/21/2019 1308 Last data filed at 07/21/2019  1209 Gross per 24 hour  Intake 840 ml  Output 2400 ml  Net -1560 ml   Filed Weights   07/20/19 0745 07/21/19 0500  Weight: 72.6 kg 71.1 kg    Exam: General: Well-nourished awake no acute distress CV: Tachycardic but regular hear no murmur gallop or rub no lower extremity edema Respiratory: No increased work of breathing breath sounds are clear bilaterally hear no crackles no wheezes Abdomen soft positive bowel sounds throughout no guarding or  rebounding Musculoskeletal: Joints without swelling/erythema full range of motion Neuro: Alert and oriented x3 speech clear facial symmetry  Data Reviewed:   I have personally reviewed following labs and imaging studies:  Labs: Labs show the following:   Basic Metabolic Panel: Recent Labs  Lab 07/20/19 0250 07/21/19 0615  NA 141 140  K 3.2* 3.8  CL 111 102  CO2 20* 25  GLUCOSE 100* 124*  BUN 10 11  CREATININE 0.79 0.90  CALCIUM 9.1 9.7  MG  --  1.9   GFR Estimated Creatinine Clearance: 71.9 mL/min (by C-G formula based on SCr of 0.9 mg/dL). Liver Function Tests: Recent Labs  Lab 07/20/19 0443  AST 19  ALT 17  ALKPHOS 59  BILITOT 1.0  PROT 7.1  ALBUMIN 3.4*   No results for input(s): LIPASE, AMYLASE in the last 168 hours. No results for input(s): AMMONIA in the last 168 hours. Coagulation profile No results for input(s): INR, PROTIME in the last 168 hours.  CBC: Recent Labs  Lab 07/20/19 0250 07/21/19 0615  WBC 6.0 9.5  HGB 11.4* 12.7  HCT 36.6 40.3  MCV 90.1 88.2  PLT 284 295   Cardiac Enzymes: No results for input(s): CKTOTAL, CKMB, CKMBINDEX, TROPONINI in the last 168 hours. BNP (last 3 results) No results for input(s): PROBNP in the last 8760 hours. CBG: Recent Labs  Lab 07/21/19 0740  GLUCAP 120*   D-Dimer: No results for input(s): DDIMER in the last 72 hours. Hgb A1c: No results for input(s): HGBA1C in the last 72 hours. Lipid Profile: No results for input(s): CHOL, HDL, LDLCALC, TRIG, CHOLHDL, LDLDIRECT in the last 72 hours. Thyroid function studies: No results for input(s): TSH, T4TOTAL, T3FREE, THYROIDAB in the last 72 hours.  Invalid input(s): FREET3 Anemia work up: Recent Labs    07/21/19 0615  FERRITIN 21  TIBC 378  IRON 38   Sepsis Labs: Recent Labs  Lab 07/20/19 0250 07/21/19 0615  WBC 6.0 9.5    Microbiology Recent Results (from the past 240 hour(s))  Respiratory Panel by RT PCR (Flu A&B, Covid) -  Nasopharyngeal Swab     Status: None   Collection Time: 07/20/19  7:47 AM   Specimen: Nasopharyngeal Swab  Result Value Ref Range Status   SARS Coronavirus 2 by RT PCR NEGATIVE NEGATIVE Final    Comment: (NOTE) SARS-CoV-2 target nucleic acids are NOT DETECTED. The SARS-CoV-2 RNA is generally detectable in upper respiratoy specimens during the acute phase of infection. The lowest concentration of SARS-CoV-2 viral copies this assay can detect is 131 copies/mL. A negative result does not preclude SARS-Cov-2 infection and should not be used as the sole basis for treatment or other patient management decisions. A negative result may occur with  improper specimen collection/handling, submission of specimen other than nasopharyngeal swab, presence of viral mutation(s) within the areas targeted by this assay, and inadequate number of viral copies (<131 copies/mL). A negative result must be combined with clinical observations, patient history, and epidemiological information. The expected result is  Negative. Fact Sheet for Patients:  PinkCheek.be Fact Sheet for Healthcare Providers:  GravelBags.it This test is not yet ap proved or cleared by the [place] FDA and  has been authorized for detection and/or diagnosis of SARS-CoV-2 by FDA under an Emergency Use Authorization (EUA). This EUA will remain  in effect (meaning this test can be used) for the duration of the COVID-19 declaration under Section 564(b)(1) of the Act, 21 U.S.C. section 360bbb-3(b)(1), unless the authorization is terminated or revoked sooner.    Influenza A by PCR NEGATIVE NEGATIVE Final   Influenza B by PCR NEGATIVE NEGATIVE Final    Comment: (NOTE) The Xpert Xpress SARS-CoV-2/FLU/RSV assay is intended as an aid in  the diagnosis of influenza from Nasopharyngeal swab specimens and  should not be used as a sole basis for treatment. Nasal washings and  aspirates  are unacceptable for Xpert Xpress SARS-CoV-2/FLU/RSV  testing. Fact Sheet for Patients: PinkCheek.be Fact Sheet for Healthcare Providers: GravelBags.it This test is not yet approved or cleared by the [place] FDA and  has been authorized for detection and/or diagnosis of SARS-CoV-2 by  FDA under an Emergency Use Authorization (EUA). This EUA will remain  in effect (meaning this test can be used) for the duration of the  Covid-19 declaration under Section 564(b)(1) of the Act, 21  U.S.C. section 360bbb-3(b)(1), unless the authorization is  terminated or revoked. Performed at [facility], [place] [address], [place],  [address]     Procedures and diagnostic studies:  DG Chest 2 View  Result Date: 07/20/2019 CLINICAL DATA:  44 year old female with shortness of breath. EXAM: CHEST - 2 VIEW COMPARISON:  Chest radiograph dated 03/27/2018 FINDINGS: There is mild cardiomegaly and mild vascular congestion. No focal consolidation, pleural effusion or pneumothorax. No acute osseous pathology. IMPRESSION: Cardiomegaly with mild vascular congestion. No focal consolidation. Electronically Signed   By: Anner Crete M.D.   On: 07/20/2019 03:16   CT Angio Chest PE W and/or Wo Contrast  Result Date: 07/20/2019 CLINICAL DATA:  One month of shortness of breath EXAM: CT ANGIOGRAPHY CHEST WITH CONTRAST TECHNIQUE: Multidetector CT imaging of the chest was performed using the standard protocol during bolus administration of intravenous contrast. Multiplanar CT image reconstructions and MIPs were obtained to evaluate the vascular anatomy. CONTRAST:  52mL OMNIPAQUE IOHEXOL 350 MG/ML SOLN COMPARISON:  CT 03/27/2018 FINDINGS: Cardiovascular: Satisfactory opacification the pulmonary arteries to the segmental level. No pulmonary artery filling defects are identified. Central pulmonary arteries are normal caliber. Cardiomegaly with predominantly  left atrial ventricular enlargement is similar to comparison study. Extensive reflux of contrast into the IVC and hepatic veins. The aorta is essentially non-opacified. Normal caliber aorta with normal branching of the great vessels. No periaortic abnormality is clearly evident. There is some central pulmonary venous congestion and redistribution. Mediastinum/Nodes: Fatty stippling in the anterior mediastinum is stable from priors. No worrisome mediastinal, hilar or axillary adenopathy though difficult to fully assess given the timing of contrast. Thyroid gland and thoracic inlet is unremarkable. No acute abnormality of the trachea or esophagus. Lungs/Pleura: There is interlobular septal thickening as well as fissural thickening throughout both lungs. Peribronchovascular cuffing is noted as well. Some perihilar and basilar predominant regions of ground-glass opacity are noted with more mosaic attenuation possibly reflecting atelectasis secondary to imaging during exhalation or possible small airways disease/air trapping. No visible effusion. No pneumothorax. No suspicious nodules or masses within the limitations of respiratory motion artifact. Upper Abdomen: No acute abnormalities present in the visualized  portions of the upper abdomen. Musculoskeletal: No acute osseous abnormality or suspicious osseous lesion. Multilevel degenerative changes are present in the imaged portions of the spine. No worrisome chest wall lesions. Review of the MIP images confirms the above findings. IMPRESSION: 1. No acute pulmonary artery filling defects to suggest pulmonary embolism. 2. Cardiomegaly with predominantly left atrioventricular enlargement 3. Features of CHF with reflux of contrast into the IVC and hepatic veins, consistent with elevated right heart pressures, redistributed pulmonary venous congestion, and features of interstitial edema within the lungs. 4. Dependent and perihilar ground-glass opacities are compatible with  pulmonary edema though atypical infection is difficult to exclude. Electronically Signed   By: Kreg Shropshire M.D.   On: 07/20/2019 05:55   ECHOCARDIOGRAM COMPLETE  Result Date: 07/20/2019    ECHOCARDIOGRAM REPORT   Patient Name:   Alexandria Rhodes Date of Exam: 07/20/2019 Medical Rec #:  [id number]      Height:       61.0 in Accession #:    [id number]     Weight:       187.0 lb Date of Birth:  1975-07-29       BSA:          1.836 m Patient Age:    43 years       BP:           126/99 mmHg Patient Gender: F              HR:           93 bpm. Exam Location:  Inpatient Procedure: 2D Echo, Cardiac Doppler and Color Doppler Indications:    CHF  History:        Patient has prior history of Echocardiogram examinations, most                 recent 03/28/2018. CHF and Cardiomyopathy, Pul.embolus,                 Signs/Symptoms:Shortness of Breath; Risk Factors:Hypertension.  Sonographer:    Lavenia Atlas Referring Phys: [phone] RONDELL A SMITH IMPRESSIONS  1. Left ventricular ejection fraction, by estimation, is 20 to 25%. The left ventricle has severely decreased function. The left ventricle demonstrates global hypokinesis. The left ventricular internal cavity size was moderately dilated. Left ventricular diastolic parameters are consistent with Grade II diastolic dysfunction (pseudonormalization).  2. Right ventricular systolic function is mildly reduced. The right ventricular size is normal. There is moderately elevated pulmonary artery systolic pressure. The estimated right ventricular systolic pressure is 45.0 mmHg.  3. Left atrial size was severely dilated.  4. The mitral valve is normal in structure. Moderate mitral valve regurgitation.  5. The aortic valve is tricuspid. Aortic valve regurgitation is trivial. No aortic stenosis is present.  6. The inferior vena cava is dilated in size with <50% respiratory variability, suggesting right atrial pressure of 15 mmHg. FINDINGS  Left Ventricle: Left ventricular ejection  fraction, by estimation, is 20 to 25%. The left ventricle has severely decreased function. The left ventricle demonstrates global hypokinesis. The left ventricular internal cavity size was moderately dilated. There is no left ventricular hypertrophy. Left ventricular diastolic parameters are consistent with Grade II diastolic dysfunction (pseudonormalization). Right Ventricle: The right ventricular size is normal. No increase in right ventricular wall thickness. Right ventricular systolic function is mildly reduced. There is moderately elevated pulmonary artery systolic pressure. The tricuspid regurgitant velocity is 2.74 m/s, and with an assumed right atrial pressure of 15 mmHg, the estimated right ventricular systolic  pressure is 45.0 mmHg. Left Atrium: Left atrial size was severely dilated. Right Atrium: Right atrial size was normal in size. Pericardium: There is no evidence of pericardial effusion. Mitral Valve: The mitral valve is normal in structure. Moderate mitral valve regurgitation. Tricuspid Valve: The tricuspid valve is normal in structure. Tricuspid valve regurgitation is trivial. Aortic Valve: The aortic valve is tricuspid. Aortic valve regurgitation is trivial. Aortic regurgitation PHT measures 611 msec. No aortic stenosis is present. Pulmonic Valve: The pulmonic valve was normal in structure. Pulmonic valve regurgitation is trivial. Aorta: The aortic root is normal in size and structure. Venous: The inferior vena cava is dilated in size with less than 50% respiratory variability, suggesting right atrial pressure of 15 mmHg. IAS/Shunts: No atrial level shunt detected by color flow Doppler.  LEFT VENTRICLE PLAX 2D LVIDd:         6.52 cm      Diastology LVIDs:         5.82 cm      LV e' lateral:   5.06 cm/s LV PW:         0.92 cm      LV E/e' lateral: 23.5 LV IVS:        0.87 cm      LV e' medial:    4.81 cm/s LVOT diam:     2.30 cm      LV E/e' medial:  24.7 LV SV:         71 LV SV Index:   38 LVOT  Area:     4.15 cm  LV Volumes (MOD) LV vol d, MOD A4C: 286.0 ml LV vol s, MOD A4C: 204.0 ml LV SV MOD A4C:     286.0 ml RIGHT VENTRICLE RV Basal diam:  2.91 cm RV S prime:     9.34 cm/s TAPSE (M-mode): 2.6 cm LEFT ATRIUM              Index       RIGHT ATRIUM           Index LA diam:        4.70 cm  2.56 cm/m  RA Area:     14.40 cm LA Vol (A2C):   106.0 ml 57.75 ml/m RA Volume:   32.30 ml  17.60 ml/m LA Vol (A4C):   97.3 ml  53.01 ml/m LA Biplane Vol: 106.0 ml 57.75 ml/m  AORTIC VALVE LVOT Vmax:   107.00 cm/s LVOT Vmean:  61.600 cm/s LVOT VTI:    0.170 m AI PHT:      611 msec  AORTA Ao Root diam: 2.60 cm MITRAL VALVE                TRICUSPID VALVE MV Area (PHT): 7.59 cm     TR Peak grad:   30.0 mmHg MV Decel Time: 100 msec     TR Vmax:        274.00 cm/s MV E velocity: 119.00 cm/s MV A velocity: 74.40 cm/s   SHUNTS MV E/A ratio:  1.60         Systemic VTI:  0.17 m                             Systemic Diam: 2.30 cm Marca Ancona MD Electronically signed by Marca Ancona MD Signature Date/Time: 07/20/2019/4:10:24 PM    Final     Medications:    digoxin  0.125 mg Oral Daily   enoxaparin (  LOVENOX) injection  40 mg Subcutaneous Daily   furosemide  40 mg Intravenous BID   losartan  25 mg Oral Daily   potassium chloride  40 mEq Oral Daily   sodium chloride flush  3 mL Intravenous Q12H   spironolactone  12.5 mg Oral Daily   Continuous Infusions:  sodium chloride       LOS: 1 day   Gwenyth Bender NP  [facility]   How to contact the [facility] Attending or Consulting provider 7A - 7P or covering provider during after hours 7P -7A, for this patient?  Check the care team in [facility] and look for a) attending/consulting TRH provider listed and b) the [facility] team listed Log into [email or web address] and use [place]'s universal password to access. If you do not have the password, please contact the hospital operator. Locate the [facility] provider you are looking for under [facility] and page to a number that  you can be directly reached. If you still have difficulty reaching the provider, please page the [facility] (Director on Call) for the Hospitalists listed on amion for assistance.  07/21/2019, 1:08 PM

## 2019-07-22 DIAGNOSIS — E876 Hypokalemia: Secondary | ICD-10-CM

## 2019-07-22 DIAGNOSIS — Z86711 Personal history of pulmonary embolism: Secondary | ICD-10-CM

## 2019-07-22 DIAGNOSIS — I5023 Acute on chronic systolic (congestive) heart failure: Secondary | ICD-10-CM

## 2019-07-22 LAB — CBC
HCT: 41.9 % (ref 36.0–46.0)
Hemoglobin: 13.2 g/dL (ref 12.0–15.0)
MCH: 28.1 pg (ref 26.0–34.0)
MCHC: 31.5 g/dL (ref 30.0–36.0)
MCV: 89.1 fL (ref 80.0–100.0)
Platelets: 292 10*3/uL (ref 150–400)
RBC: 4.7 MIL/uL (ref 3.87–5.11)
RDW: 13.6 % (ref 11.5–15.5)
WBC: 5.3 10*3/uL (ref 4.0–10.5)
nRBC: 0 % (ref 0.0–0.2)

## 2019-07-22 LAB — BASIC METABOLIC PANEL
Anion gap: 11 (ref 5–15)
BUN: 15 mg/dL (ref 6–20)
CO2: 28 mmol/L (ref 22–32)
Calcium: 9.5 mg/dL (ref 8.9–10.3)
Chloride: 99 mmol/L (ref 98–111)
Creatinine, Ser: 0.99 mg/dL (ref 0.44–1.00)
GFR calc Af Amer: >60 mL/min (ref >60)
GFR calc non Af Amer: >60 mL/min (ref >60)
Glucose, Bld: 116 mg/dL — ABNORMAL HIGH (ref 70–99)
Potassium: 3.2 mmol/L — ABNORMAL LOW (ref 3.5–5.1)
Sodium: 138 mmol/L (ref 135–145)

## 2019-07-22 MED ORDER — SACUBITRIL-VALSARTAN 24-26 MG PO TABS: 1.0000 | ORAL_TABLET | Freq: Two times a day (BID) | ORAL | Status: DC

## 2019-07-22 MED ORDER — SPIRONOLACTONE 25 MG PO TABS: 12.5000 mg | ORAL_TABLET | Freq: Every day | ORAL | 0 refills | Status: DC

## 2019-07-22 MED ORDER — FUROSEMIDE 40 MG PO TABS
40.0000 mg | ORAL_TABLET | Freq: Every day | ORAL | 0 refills | Status: DC
Start: 2019-07-22 — End: 2019-08-22

## 2019-07-22 MED ORDER — POTASSIUM CHLORIDE CRYS ER 20 MEQ PO TBCR: 20.0000 meq | EXTENDED_RELEASE_TABLET | Freq: Every day | ORAL | Status: DC

## 2019-07-22 MED ORDER — SPIRONOLACTONE 25 MG PO TABS: 12.5000 mg | ORAL_TABLET | Freq: Every day | ORAL | Status: DC

## 2019-07-22 MED ORDER — POTASSIUM CHLORIDE CRYS ER 20 MEQ PO TBCR: 20.0000 meq | EXTENDED_RELEASE_TABLET | Freq: Every day | ORAL | 0 refills | Status: DC

## 2019-07-22 MED ORDER — FUROSEMIDE 40 MG PO TABS: 40.0000 mg | ORAL_TABLET | Freq: Every day | ORAL | 11 refills | Status: DC

## 2019-07-22 MED ORDER — SACUBITRIL-VALSARTAN 24-26 MG PO TABS: 1.0000 | ORAL_TABLET | Freq: Two times a day (BID) | ORAL | 0 refills | Status: DC

## 2019-07-22 MED ORDER — DIGOXIN 125 MCG PO TABS: 0.1250 mg | ORAL_TABLET | Freq: Every day | ORAL | Status: DC

## 2019-07-22 MED ORDER — DIGOXIN 125 MCG PO TABS: 0.1250 mg | ORAL_TABLET | Freq: Every day | ORAL | 0 refills | Status: DC

## 2019-07-22 NOTE — TOC Transition Note (Signed)
Transition of Care [facility]) - CM/SW Discharge Note   Patient Details  Name: Alexandria Rhodes MRN: [id number] Date of Birth: June 16, 1975  [facility] CM/SW Contact:  Bess Kinds, RN Phone Number: [phone] 07/22/2019, 11:04 AM   Clinical Narrative:     Spoke with patient at the bedside. Discussed MATCH letter and also provided Entresto 30 day card. Discussed that her pharmacy of choice, [facility] [address], is on the list of eligible pharmacies. Encouraged to follow up with HF Clinic appointment after discharge. Patient verbalized understanding. Patient stated that she has transportation home at discharge. No futher TOC needs identified.   Final next level of care: Home/Self Care Barriers to Discharge: No Barriers Identified   Patient Goals and CMS Choice   CMS Medicare.gov Compare Post Acute Care list provided to:: Patient Choice offered to / list presented to : NA  Discharge Placement                       Discharge Plan and Services                DME Arranged: N/A DME Agency: NA       HH Arranged: NA HH Agency: NA        Social Determinants of Health (SDOH) Interventions     Readmission Risk Interventions No flowsheet data found.

## 2019-07-22 NOTE — Progress Notes (Signed)
Education completed re: Heart Failure.  Went over Living with Heart Failure booklet with patient in depth along with importance of daily weights and what to do if she gains more than 3 pounds in a day, or 5 pounds in a week.  Went over 2 handouts on low sodium diets and home exercise guidelines.  Patient does not have insurance coverage and did not refer to phase II cardiac rehab due to the financial burden. [phone]

## 2019-07-22 NOTE — Progress Notes (Signed)
Received call from patient that MATCH not working. Spoke with [facility] pharmacist - MATCH was working but savings card for [employer] did not. Found online that patient has been activated for $10 copay card, but information could not be retrieved. Verified no medications stored in main pharmacy. MATCH override received. [facility] pharmacist notified and verified that MATCH went through for all medications. Patient notified that prescriptions were being filled. Patient was aware of the $10 copay activation, but has not been provided the information at this time. Advised to get copay card at her follow up HF clinic appointment or go online at [email or web address] to get contact information for [facility] - advised that customer service is available 8a-8p M-F. Patient expressed appreciation.   Hortencia Conradi, RN MSN CCM Transitions of Care [phone]

## 2019-07-22 NOTE — Discharge Instructions (Signed)
Heart Failure, Self Care Heart failure is a serious condition. This document explains the things you need to do to take care of yourself after a heart failure diagnosis. You may be asked to change your diet, take certain medicines, and make other lifestyle changes in order to stay as healthy as possible. Your health care provider may also give you more specific instructions. If you have problems or questions, contact your health care provider. What are the risks? Having heart failure puts you at higher risk for certain problems. These problems can get worse if you do not take good care of yourself. Problems may include:  Blood clotting problems. This may cause a stroke.  Damage to the kidneys, liver, or lungs.  Abnormal heart rhythms. Supplies needed:  Scale for monitoring weight.  Blood pressure monitor.  Notebook.  Medicines. How to care for yourself when you have heart failure Medicines Take over-the-counter and prescription medicines only as told by your health care provider. Medicines reduce the workload of your heart, slow the progression of heart failure, and improve symptoms. Take your medicines every day.  Do not stop taking your medicine unless your health care provider tells you to do so.  Do not skip any dose of medicine.  Refill your prescriptions before you run out of medicine. Eating and drinking   Eat heart-healthy foods. Talk with a dietitian to make an eating plan that is right for you. ? Choose foods that contain no trans fat and are low in saturated fat and cholesterol. Healthy choices include fresh or frozen fruits and vegetables, fish, lean meats, legumes, fat-free or low-fat dairy products, and whole-grain or high-fiber foods. ? Limit salt (sodium) if told by your health care provider. Sodium restriction may reduce symptoms of heart failure. Ask a dietitian to recommend heart-healthy seasonings. ? Use healthy cooking methods instead of frying. Healthy methods  include roasting, grilling, broiling, baking, poaching, steaming, and stir-frying.  Limit your fluid intake, if directed by your health care provider. Fluid restriction may reduce symptoms of heart failure. Alcohol use  Do not drink alcohol if: ? Your health care provider tells you not to drink. ? Your heart was damaged by alcohol, or you have severe heart failure. ? You are pregnant, may be pregnant, or are planning to become pregnant.  If you drink alcohol: ? Limit how much you use to:  0-1 drink a day for women.  0-2 drinks a day for men. ? Be aware of how much alcohol is in your drink. In the [place] one drink equals one 12 oz bottle of beer (355 mL), one 5 oz glass of wine (148 mL), or one 1 oz glass of hard liquor (44 mL). Lifestyle   Do not use any products that contain nicotine or tobacco, such as cigarettes, e-cigarettes, and chewing tobacco. If you need help quitting, ask your health care provider. ? Do not use nicotine gum or patches before talking to your health care provider.  Do not use illegal drugs.  Work with your health care provider to safely reach the right body weight.  Do physical activity if told by your health care provider. Talk to your health care provider before you begin an exercise if: ? You are an older adult. ? You have severe heart failure.  Learn to manage stress. If you need help to do this, ask your health care provider.  Participate in or seek rehabilitation as needed to keep or improve your independence and quality of life.  Plan   rest periods when you get tired. Monitoring important information   Weigh yourself every day. This will help you to notice if too much fluid is building up in your body. ? Weigh yourself every morning after you urinate and before you eat breakfast. ? Wear the same amount of clothing each time you weigh yourself. ? Record your daily weight. Provide your health care provider with your weight record.  Monitor and  record your pulse and blood pressure as told by your health care provider. Dealing with extreme temperatures  If the weather is extremely hot: ? Avoid vigorous physical activity. ? Use air conditioning or fans, or find a cooler location. ? Avoid caffeine and alcohol. ? Wear loose-fitting, lightweight, and light-colored clothing.  If the weather is extremely cold: ? Avoid vigorous activity. ? Layer your clothes. ? Wear mittens or gloves, a hat, and a scarf when you go outside. ? Avoid alcohol. Follow these instructions at home:  Stay up to date with vaccines. Pneumococcal and flu (influenza) vaccines are especially important in preventing infections of the airways.  Keep all follow-up visits as told by your health care provider. This is important. Contact a health care provider if you:  Have a rapid weight gain.  Have increasing shortness of breath.  Are unable to participate in your usual physical activities.  Get tired easily.  Cough more than normal, especially with physical activity.  Lose your appetite or feel nauseous.  Have any swelling or more swelling in areas such as your hands, feet, ankles, or abdomen.  Are unable to sleep because it is hard to breathe.  Feel like your heart is beating quickly (palpitations).  Become dizzy or light-headed when you stand up. Get help right away if you:  Have trouble breathing.  Notice or your family notices a change in your awareness, such as having trouble staying awake or concentrating.  Have pain or discomfort in your chest.  Have an episode of fainting (syncope). These symptoms may represent a serious problem that is an emergency. Do not wait to see if the symptoms will go away. Get medical help right away. Call your local emergency services (911 in the [place] Do not drive yourself to the hospital. Summary  Heart failure is a serious condition. To care for yourself, you may be asked to change your diet, take certain  medicines, and make other lifestyle changes.  Take your medicines every day. Do not stop taking them unless your health care provider tells you to do so.  Eat heart-healthy foods, such as fresh or frozen fruits and vegetables, fish, lean meats, legumes, fat-free or low-fat dairy products, and whole-grain or high-fiber foods.  Ask your health care provider if you have any alcohol restrictions. You may have to stop drinking alcohol if you have severe heart failure.  Contact your health care provider if you notice problems, such as rapid weight gain or a fast heartbeat. Get help right away if you faint, or have chest pain or trouble breathing. This information is not intended to replace advice given to you by your health care provider. Make sure you discuss any questions you have with your health care provider. Document Revised: 06/14/2018 Document Reviewed: 06/15/2018 Elsevier Patient Education  2020 [employer]   Heart Failure Eating Plan Heart failure, also called congestive heart failure, occurs when your heart does not pump blood well enough to meet your body's needs for oxygen-rich blood. Heart failure is a long-term (chronic) condition. Living with heart failure can  be challenging. However, following your health care provider's instructions about a healthy lifestyle and working with a diet and nutrition specialist (dietitian) to choose the right foods may help to improve your symptoms. What are tips for following this plan? Reading food labels  Check food labels for the amount of sodium per serving. Choose foods that have less than 140 mg (milligrams) of sodium in each serving.  Check food labels for the number of calories per serving. This is important if you need to limit your daily calorie intake to lose weight.  Check food labels for the serving size. If you eat more than one serving, you will be eating more sodium and calories than what is listed on the label.  Look for foods  that are labeled as "sodium-free," "very low sodium," or "low sodium." ? Foods labeled as "reduced sodium" or "lightly salted" may still have more sodium than what is recommended for you. Cooking  Avoid adding salt when cooking. Ask your health care provider or dietitian before using salt substitutes.  Season food with salt-free seasonings, spices, or herbs. Check the label of seasoning mixes to make sure they do not contain salt.  Cook with heart-healthy oils, such as olive, canola, soybean, or sunflower oil.  Do not fry foods. Cook foods using low-fat methods, such as baking, boiling, grilling, and broiling.  Limit unhealthy fats when cooking by: ? Removing the skin from poultry, such as chicken. ? Removing all visible fats from meats. ? Skimming the fat off from stews, soups, and gravies before serving them. Meal planning   Limit your intake of: ? Processed, canned, or pre-packaged foods. ? Foods that are high in trans fat, such as fried foods. ? Sweets, desserts, sugary drinks, and other foods with added sugar. ? Full-fat dairy products, such as whole milk.  Eat a balanced diet that includes: ? 4-5 servings of fruit each day and 4-5 servings of vegetables each day. At each meal, try to fill half of your plate with fruits and vegetables. ? Up to 6-8 servings of whole grains each day. ? Up to 2 servings of lean meat, poultry, or fish each day. One serving of meat is equal to 3 oz. This is about the same size as a deck of cards. ? 2 servings of low-fat dairy each day. ? Heart-healthy fats. Healthy fats called omega-3 fatty acids are found in foods such as flaxseed and cold-water fish like sardines, salmon, and mackerel.  Aim to eat 25-35 g (grams) of fiber a day. Foods that are high in fiber include apples, broccoli, carrots, beans, peas, and whole grains.  Do not add salt or condiments that contain salt (such as soy sauce) to foods before eating.  When eating at a restaurant,  ask that your food be prepared with less salt or no salt, if possible.  Try to eat 2 or more vegetarian meals each week.  Eat more home-cooked food and eat less restaurant, buffet, and fast food. General information  Do not eat more than 2,300 mg of salt (sodium) a day. The amount of sodium that is recommended for you may be lower, depending on your condition.  Maintain a healthy body weight as directed. Ask your health care provider what a healthy weight is for you. ? Check your weight every day. ? Work with your health care provider and dietitian to make a plan that is right for you to lose weight or maintain your current weight.  Limit how much fluid you  drink. Ask your health care provider or dietitian how much fluid you can have each day.  Limit or avoid alcohol as told by your health care provider or dietitian. Recommended foods The items listed may not be a complete list. Talk with your dietitian about what dietary choices are best for you. Fruits All fresh, frozen, and canned fruits. Dried fruits, such as raisins, prunes, and cranberries. Vegetables All fresh vegetables. Vegetables that are frozen without sauce or added salt. Low-sodium or sodium-free canned vegetables. Grains Bread with less than 80 mg of sodium per slice. Whole-wheat pasta, quinoa, and brown rice. Oats and oatmeal. Barley. Millet. Grits and cream of wheat. Whole-grain and whole-wheat cold cereal. Meats and other protein foods Lean cuts of meat. Skinless chicken and [place]. Fish with high omega-3 fatty acids, such as salmon, sardines, and other cold-water fishes. Eggs. Dried beans, peas, and edamame. Unsalted nuts and nut butters. Dairy Low-fat or nonfat (skim) milk and dried milk. Rice milk, soy milk, and almond milk. Low-fat or nonfat yogurt. Small amounts of reduced-sodium block cheese. Low-sodium cottage cheese. Fats and oils Olive, canola, soybean, flaxseed, or sunflower oil. Avocado. Sweets and  desserts Apple sauce. Granola bars. Sugar-free pudding and gelatin. Frozen fruit bars. Seasoning and other foods Fresh and dried herbs. Lemon or lime juice. Vinegar. Low-sodium ketchup. Salt-free marinades, salad dressings, sauces, and seasonings. The items listed above may not be a complete list of foods and beverages you can eat. Contact a dietitian for more information. Foods to avoid The items listed may not be a complete list. Talk with your dietitian about what dietary choices are best for you. Fruits Fruits that are dried with sodium-containing preservatives. Vegetables Canned vegetables. Frozen vegetables with sauce or seasonings. Creamed vegetables. [place] fries. Onion rings. Pickled vegetables and sauerkraut. Grains Bread with more than 80 mg of sodium per slice. Hot or cold cereal with more than 140 mg sodium per serving. Salted pretzels and crackers. Pre-packaged breadcrumbs. Bagels, croissants, and biscuits. Meats and other protein foods Ribs and chicken wings. Bacon, ham, pepperoni, bologna, salami, and packaged luncheon meats. Hot dogs, bratwurst, and sausage. Canned meat. Smoked meat and fish. Salted nuts and seeds. Dairy Whole milk, half-and-half, and cream. Buttermilk. Processed cheese, cheese spreads, and cheese curds. Regular cottage cheese. Feta cheese. Shredded cheese. String cheese. Fats and oils Butter, lard, shortening, ghee, and bacon fat. Canned and packaged gravies. Seasoning and other foods Onion salt, garlic salt, table salt, and sea salt. Marinades. Regular salad dressings. Relishes, pickles, and olives. Meat flavorings and tenderizers, and bouillon cubes. Horseradish, ketchup, and mustard. Worcestershire sauce. Teriyaki sauce, soy sauce (including reduced sodium). Hot sauce and Tabasco sauce. Steak sauce, fish sauce, oyster sauce, and cocktail sauce. Taco seasonings. Barbecue sauce. Tartar sauce. The items listed above may not be a complete list of foods and  beverages you should avoid. Contact a dietitian for more information. Summary  A heart failure eating plan includes changes that limit your intake of sodium and unhealthy fat, and it may help you lose weight or maintain a healthy weight. Your health care provider may also recommend limiting how much fluid you drink.  Most people with heart failure should eat no more than 2,300 mg of salt (sodium) a day. The amount of sodium that is recommended for you may be lower, depending on your condition.  Contact your health care provider or dietitian before making any major changes to your diet. This information is not intended to replace advice given to you by  your health care provider. Make sure you discuss any questions you have with your health care provider. Document Revised: 04/28/2018 Document Reviewed: 07/17/2016 Elsevier Patient Education  2020 [employer].

## 2019-07-22 NOTE — Discharge Summary (Signed)
Physician Discharge Summary  Alexandria Rhodes GBE:[id number] DOB: [birth year] DOA: 07/20/2019  PCP: Kallie Locks, FNP  Admit date: 07/20/2019 Discharge date: 07/22/2019  Admitted From: Home Discharge disposition: Home   Recommendations for Outpatient Follow-Up:   1. Close follow-up with the advanced heart care team   Discharge Diagnosis:   Principal Problem:   Acute exacerbation of CHF (congestive heart failure) (HCC) Active Problems:   HTN (hypertension)   History of pulmonary embolism   Hypokalemia   Normocytic anemia    Discharge Condition: Improved.  Diet recommendation: Low sodium, heart healthy  Wound care: None.  Code status: Full.   History of Present Illness:   Alexandria Rhodes is a 44 y.o. female with medical history significant of nonischemic cardiomyopathy with EF 65-70% with grade 1 diastolic dysfunction in 03/2018 and PE no longer on anticoagulation presents with waxing and waning complaints of shortness of breath over the last 1 month.   She reports having a intermittently minimally productive cough with clear sputum and wheezing.  Reports having worsening shortness of breath with exertion.  Denies having any leg swelling, calf pain, nausea, vomiting, diarrhea, or dysuria symptoms.  Last night she was unable to sleep and was awakened out of her sleep due to difficulty breathing.  She reports remote history of smoking for less than 5 years, does not drink alcohol, or use any illicit drugs.  Her family history is significant for hypertension and heart failure on her mother side.  Patient was last hospitalized in January 2020 after presenting with chest pain found to have a pulmonary embolus.  The pulmonary embolus was suspected to be provoked by moving from [place].  She was placed on Eliquis, but was only able to take the medications for 1 month due to financial issues.  Over the last year patient has not been on any medications.    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 -Patient has been noncompliant with her medications -Lasix 40 mg daily -Entresto twice daily -Digoxin -Results of the cardiac MRI pending  Elevated troponin -Suspect demand  Hypokalemia -Replete  History of PE -Only took 1 month of Eliquis due to cost -Repeat CTA negative    Medical Consultants:   CHF team   Discharge Exam:   Vitals:   07/22/19 0001 07/22/19 0541  BP: 98/61 104/70  Pulse: 62 62  Resp: 18 18  Temp: 98.7 F (37.1 C) 98.2 F (36.8 C)  SpO2: 95% 95%   Vitals:   07/21/19 2000 07/21/19 2025 07/22/19 0001 07/22/19 0541  BP:  (!) 92/59 98/61 104/70  Pulse: 80  62 62  Resp: 18  18 18   Temp: 99 F (37.2 C)  98.7 F (37.1 C) 98.2 F (36.8 C)  TempSrc: Oral  Oral Oral  SpO2:   95% 95%  Weight:    70.9 kg  Height:        General exam: Appears calm and comfortable.    The results of significant diagnostics from this hospitalization (including imaging, microbiology, ancillary and laboratory) are listed below for reference.     Procedures and Diagnostic Studies:   DG Chest 2 View  Result Date: 07/20/2019 CLINICAL DATA:  44 year old female with shortness of breath. EXAM: CHEST - 2 VIEW COMPARISON:  Chest radiograph dated 03/27/2018 FINDINGS: There is mild cardiomegaly and mild vascular congestion. No focal consolidation, pleural effusion or pneumothorax. No acute osseous pathology. IMPRESSION: Cardiomegaly with mild vascular congestion. No focal  consolidation. Electronically Signed   By: Elgie Collard M.D.   On: 07/20/2019 03:16   CT Angio Chest PE W and/or Wo Contrast  Result Date: 07/20/2019 CLINICAL DATA:  One month of shortness of breath EXAM: CT ANGIOGRAPHY CHEST WITH CONTRAST TECHNIQUE: Multidetector CT imaging of the chest was performed using the standard protocol during bolus administration of intravenous contrast. Multiplanar CT image reconstructions and MIPs were obtained  to evaluate the vascular anatomy. CONTRAST:  75mL OMNIPAQUE IOHEXOL 350 MG/ML SOLN COMPARISON:  CT 03/27/2018 FINDINGS: Cardiovascular: Satisfactory opacification the pulmonary arteries to the segmental level. No pulmonary artery filling defects are identified. Central pulmonary arteries are normal caliber. Cardiomegaly with predominantly left atrial ventricular enlargement is similar to comparison study. Extensive reflux of contrast into the IVC and hepatic veins. The aorta is essentially non-opacified. Normal caliber aorta with normal branching of the great vessels. No periaortic abnormality is clearly evident. There is some central pulmonary venous congestion and redistribution. Mediastinum/Nodes: Fatty stippling in the anterior mediastinum is stable from priors. No worrisome mediastinal, hilar or axillary adenopathy though difficult to fully assess given the timing of contrast. Thyroid gland and thoracic inlet is unremarkable. No acute abnormality of the trachea or esophagus. Lungs/Pleura: There is interlobular septal thickening as well as fissural thickening throughout both lungs. Peribronchovascular cuffing is noted as well. Some perihilar and basilar predominant regions of ground-glass opacity are noted with more mosaic attenuation possibly reflecting atelectasis secondary to imaging during exhalation or possible small airways disease/air trapping. No visible effusion. No pneumothorax. No suspicious nodules or masses within the limitations of respiratory motion artifact. Upper Abdomen: No acute abnormalities present in the visualized portions of the upper abdomen. Musculoskeletal: No acute osseous abnormality or suspicious osseous lesion. Multilevel degenerative changes are present in the imaged portions of the spine. No worrisome chest wall lesions. Review of the MIP images confirms the above findings. IMPRESSION: 1. No acute pulmonary artery filling defects to suggest pulmonary embolism. 2. Cardiomegaly  with predominantly left atrioventricular enlargement 3. Features of CHF with reflux of contrast into the IVC and hepatic veins, consistent with elevated right heart pressures, redistributed pulmonary venous congestion, and features of interstitial edema within the lungs. 4. Dependent and perihilar ground-glass opacities are compatible with pulmonary edema though atypical infection is difficult to exclude. Electronically Signed   By: Kreg Shropshire M.D.   On: 07/20/2019 05:55   MR CARDIAC MORPHOLOGY W WO CONTRAST  Result Date: 07/22/2019 CLINICAL DATA:  44 year old female with h/o non-ischemic cardiomyopathy. EXAM: CARDIAC MRI TECHNIQUE: The patient was scanned on a 1.5 Tesla GE magnet. A dedicated cardiac coil was used. Functional imaging was done using Fiesta sequences. 2,3, and 4 chamber views were done to assess for RWMA's. Modified Simpson's rule using a short axis stack was used to calculate an ejection fraction on a dedicated work [employer]. The patient received 8 cc of Gadavist. After 10 minutes inversion recovery sequences were used to assess for infiltration and scar tissue. CONTRAST:  8 cc  of Gadavist FINDINGS: 1. Severely dilated left ventricle with normal wall thickness and severely impaired systolic function (LVEF = 29%). Diffuse hypokinesis and paradoxical septal motion. There is midwall no late gadolinium enhancement - non-specific sign consistent with non-ischemic cardiomyopathy. Significant trabeculations are seen predominantly in the basal and mid myocardial segments with ratio of non-compacted to compacted myocardium > 5:1. LVEDD: 69 mm LVESD: 62 mm LVEDV: 289 ml LVESV: 205 ml SV: 84 ml CO: 4.3 L/min Myocardial mass: 137 g 2.  Normal right ventricular size, thickness and systolic function (LVEF = 55%). There are no regional wall motion abnormalities. 3.  Severely dilated left atrium. Normal right atrial size. 4. Normal size of the aortic root, ascending aorta and pulmonary  artery. 5. Moderate to severe mitral regurgitation (secondary, functional), mild tricuspid regurgitation, mild aortic regurgitation. 6.  Normal pericardium.  Trivial pericardial effusion. IMPRESSION: 1. Severely dilated left ventricle with normal wall thickness and severely impaired systolic function (LVEF = 29%). Diffuse hypokinesis and paradoxical septal motion. There is midwall no late gadolinium enhancement - non-specific sign consistent with non-ischemic cardiomyopathy. Significant trabeculations are seen predominantly in the basal and mid myocardial segments with ratio of non-compacted to compacted myocardium > 5:1. 2. Normal right ventricular size, thickness and systolic function (LVEF = 55%). There are no regional wall motion abnormalities. 3. Severely dilated left atrium. Normal right atrial size. 4. Normal size of the aortic root, ascending aorta and pulmonary artery. 5. Moderate to severe mitral regurgitation (secondary, functional), mild tricuspid regurgitation, mild aortic regurgitation. 6.  Normal pericardium.  Trivial pericardial effusion. 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 There is no evidence for infiltrative or inflammatory cardiomyopathy. Electronically Signed   By: Tobias Alexander   On: 07/21/2019 19:38   ECHOCARDIOGRAM COMPLETE  Result Date: 07/20/2019    ECHOCARDIOGRAM REPORT   Patient Name:   Alexandria Rhodes Date of Exam: 07/20/2019 Medical Rec #:  [id number]      Height:       61.0 in Accession #:    [id number]     Weight:       187.0 lb Date of Birth:  Jan 18, 1976       BSA:          1.836 m Patient Age:    43 years       BP:           126/99 mmHg Patient Gender: F              HR:           93 bpm. Exam Location:  Inpatient Procedure: 2D Echo, Cardiac Doppler and Color Doppler Indications:    CHF  History:        Patient has prior history of Echocardiogram examinations, most                 recent 03/28/2018. CHF  and Cardiomyopathy, Pul.embolus,                 Signs/Symptoms:Shortness of Breath; Risk Factors:Hypertension.  Sonographer:    Lavenia Atlas Referring Phys: [phone] RONDELL A SMITH IMPRESSIONS  1. Left ventricular ejection fraction, by estimation, is 20 to 25%. The left ventricle has severely decreased function. The left ventricle demonstrates global hypokinesis. The left ventricular internal cavity size was moderately dilated. Left ventricular diastolic parameters are consistent with Grade II diastolic dysfunction (pseudonormalization).  2. Right ventricular systolic function is mildly reduced. The right ventricular size is normal. There is moderately elevated pulmonary artery systolic pressure. The estimated right ventricular systolic pressure is 45.0 mmHg.  3. Left atrial size was severely dilated.  4. The mitral valve is normal in structure. Moderate mitral valve regurgitation.  5. The aortic valve is tricuspid. Aortic valve regurgitation is trivial. No aortic stenosis is present.  6. The inferior vena cava is dilated in size with <50% respiratory variability, suggesting right atrial pressure of 15 mmHg. FINDINGS  Left Ventricle: Left ventricular ejection fraction, by estimation, is  20 to 25%. The left ventricle has severely decreased function. The left ventricle demonstrates global hypokinesis. The left ventricular internal cavity size was moderately dilated. There is no left ventricular hypertrophy. Left ventricular diastolic parameters are consistent with Grade II diastolic dysfunction (pseudonormalization). Right Ventricle: The right ventricular size is normal. No increase in right ventricular wall thickness. Right ventricular systolic function is mildly reduced. There is moderately elevated pulmonary artery systolic pressure. The tricuspid regurgitant velocity is 2.74 m/s, and with an assumed right atrial pressure of 15 mmHg, the estimated right ventricular systolic pressure is 45.0 mmHg. Left Atrium:  Left atrial size was severely dilated. Right Atrium: Right atrial size was normal in size. Pericardium: There is no evidence of pericardial effusion. Mitral Valve: The mitral valve is normal in structure. Moderate mitral valve regurgitation. Tricuspid Valve: The tricuspid valve is normal in structure. Tricuspid valve regurgitation is trivial. Aortic Valve: The aortic valve is tricuspid. Aortic valve regurgitation is trivial. Aortic regurgitation PHT measures 611 msec. No aortic stenosis is present. Pulmonic Valve: The pulmonic valve was normal in structure. Pulmonic valve regurgitation is trivial. Aorta: The aortic root is normal in size and structure. Venous: The inferior vena cava is dilated in size with less than 50% respiratory variability, suggesting right atrial pressure of 15 mmHg. IAS/Shunts: No atrial level shunt detected by color flow Doppler.  LEFT VENTRICLE PLAX 2D LVIDd:         6.52 cm      Diastology LVIDs:         5.82 cm      LV e' lateral:   5.06 cm/s LV PW:         0.92 cm      LV E/e' lateral: 23.5 LV IVS:        0.87 cm      LV e' medial:    4.81 cm/s LVOT diam:     2.30 cm      LV E/e' medial:  24.7 LV SV:         71 LV SV Index:   38 LVOT Area:     4.15 cm  LV Volumes (MOD) LV vol d, MOD A4C: 286.0 ml LV vol s, MOD A4C: 204.0 ml LV SV MOD A4C:     286.0 ml RIGHT VENTRICLE RV Basal diam:  2.91 cm RV S prime:     9.34 cm/s TAPSE (M-mode): 2.6 cm LEFT ATRIUM              Index       RIGHT ATRIUM           Index LA diam:        4.70 cm  2.56 cm/m  RA Area:     14.40 cm LA Vol (A2C):   106.0 ml 57.75 ml/m RA Volume:   32.30 ml  17.60 ml/m LA Vol (A4C):   97.3 ml  53.01 ml/m LA Biplane Vol: 106.0 ml 57.75 ml/m  AORTIC VALVE LVOT Vmax:   107.00 cm/s LVOT Vmean:  61.600 cm/s LVOT VTI:    0.170 m AI PHT:      611 msec  AORTA Ao Root diam: 2.60 cm MITRAL VALVE                TRICUSPID VALVE MV Area (PHT): 7.59 cm     TR Peak grad:   30.0 mmHg MV Decel Time: 100 msec     TR Vmax:        274.00  cm/s MV  E velocity: 119.00 cm/s MV A velocity: 74.40 cm/s   SHUNTS MV E/A ratio:  1.60         Systemic VTI:  0.17 m                             Systemic Diam: 2.30 cm Loralie Champagne MD Electronically signed by Loralie Champagne MD Signature Date/Time: 07/20/2019/4:10:24 PM    Final      Labs:   Basic Metabolic Panel: Recent Labs  Lab 07/20/19 0250 07/20/19 0250 07/21/19 0615 07/22/19 0909  NA 141  --  140 138  K 3.2*   < > 3.8 3.2*  CL 111  --  102 99  CO2 20*  --  25 28  GLUCOSE 100*  --  124* 116*  BUN 10  --  11 15  CREATININE 0.79  --  0.90 0.99  CALCIUM 9.1  --  9.7 9.5  MG  --   --  1.9  --    < > = values in this interval not displayed.   GFR Estimated Creatinine Clearance: 65.3 mL/min (by C-G formula based on SCr of 0.99 mg/dL). Liver Function Tests: Recent Labs  Lab 07/20/19 0443  AST 19  ALT 17  ALKPHOS 59  BILITOT 1.0  PROT 7.1  ALBUMIN 3.4*   No results for input(s): LIPASE, AMYLASE in the last 168 hours. No results for input(s): AMMONIA in the last 168 hours. Coagulation profile No results for input(s): INR, PROTIME in the last 168 hours.  CBC: Recent Labs  Lab 07/20/19 0250 07/21/19 0615 07/22/19 0909  WBC 6.0 9.5 5.3  HGB 11.4* 12.7 13.2  HCT 36.6 40.3 41.9  MCV 90.1 88.2 89.1  PLT 284 295 292   Cardiac Enzymes: No results for input(s): CKTOTAL, CKMB, CKMBINDEX, TROPONINI in the last 168 hours. BNP: Invalid input(s): POCBNP CBG: Recent Labs  Lab 07/21/19 0740  GLUCAP 120*   D-Dimer No results for input(s): DDIMER in the last 72 hours. Hgb A1c No results for input(s): HGBA1C in the last 72 hours. Lipid Profile No results for input(s): CHOL, HDL, LDLCALC, TRIG, CHOLHDL, LDLDIRECT in the last 72 hours. Thyroid function studies No results for input(s): TSH, T4TOTAL, T3FREE, THYROIDAB in the last 72 hours.  Invalid input(s): FREET3 Anemia work up Recent Labs    07/21/19 0615  FERRITIN 21  TIBC 378  IRON 38   Microbiology Recent  Results (from the past 240 hour(s))  Respiratory Panel by RT PCR (Flu A&B, Covid) - Nasopharyngeal Swab     Status: None   Collection Time: 07/20/19  7:47 AM   Specimen: Nasopharyngeal Swab  Result Value Ref Range Status   SARS Coronavirus 2 by RT PCR NEGATIVE NEGATIVE Final    Comment: (NOTE) SARS-CoV-2 target nucleic acids are NOT DETECTED. The SARS-CoV-2 RNA is generally detectable in upper respiratoy specimens during the acute phase of infection. The lowest concentration of SARS-CoV-2 viral copies this assay can detect is 131 copies/mL. A negative result does not preclude SARS-Cov-2 infection and should not be used as the sole basis for treatment or other patient management decisions. A negative result may occur with  improper specimen collection/handling, submission of specimen other than nasopharyngeal swab, presence of viral mutation(s) within the areas targeted by this assay, and inadequate number of viral copies (<131 copies/mL). A negative result must be combined with clinical observations, patient history, and epidemiological information. The expected result is Negative. Fact  Sheet for Patients:  https://www.moore.com/ Fact Sheet for Healthcare Providers:  https://www.young.biz/ This test is not yet ap proved or cleared by the [place] FDA and  has been authorized for detection and/or diagnosis of SARS-CoV-2 by FDA under an Emergency Use Authorization (EUA). This EUA will remain  in effect (meaning this test can be used) for the duration of the COVID-19 declaration under Section 564(b)(1) of the Act, 21 U.S.C. section 360bbb-3(b)(1), unless the authorization is terminated or revoked sooner.    Influenza A by PCR NEGATIVE NEGATIVE Final   Influenza B by PCR NEGATIVE NEGATIVE Final    Comment: (NOTE) The Xpert Xpress SARS-CoV-2/FLU/RSV assay is intended as an aid in  the diagnosis of influenza from Nasopharyngeal swab specimens  and  should not be used as a sole basis for treatment. Nasal washings and  aspirates are unacceptable for Xpert Xpress SARS-CoV-2/FLU/RSV  testing. Fact Sheet for Patients: https://www.moore.com/ Fact Sheet for Healthcare Providers: https://www.young.biz/ This test is not yet approved or cleared by the [place] FDA and  has been authorized for detection and/or diagnosis of SARS-CoV-2 by  FDA under an Emergency Use Authorization (EUA). This EUA will remain  in effect (meaning this test can be used) for the duration of the  Covid-19 declaration under Section 564(b)(1) of the Act, 21  U.S.C. section 360bbb-3(b)(1), unless the authorization is  terminated or revoked. Performed at [facility], [address] [place], [place] [address]      Discharge Instructions:   Discharge Instructions    (HEART FAILURE PATIENTS) Call MD:  Anytime you have any of the following symptoms: 1) 3 pound weight gain in 24 hours or 5 pounds in 1 week 2) shortness of breath, with or without a dry hacking cough 3) swelling in the hands, feet or stomach 4) if you have to sleep on extra pillows at night in order to breathe.   Complete by: As directed    Diet - low sodium heart healthy   Complete by: As directed    Heart Failure patients record your daily weight using the same scale at the same time of day   Complete by: As directed    Increase activity slowly   Complete by: As directed      Allergies as of 07/22/2019      Reactions   Shellfish Allergy Anaphylaxis, Hives      Medication List    STOP taking these medications   amLODipine 5 MG tablet Commonly known as: NORVASC   apixaban 5 MG Tabs tablet Commonly known as: ELIQUIS   carvedilol 6.25 MG tablet Commonly known as: COREG   ondansetron 4 MG tablet Commonly known as: Zofran     TAKE these medications   acetaminophen 500 MG tablet Commonly known as: TYLENOL Take 1,000-1,500 mg by mouth  2 (two) times daily as needed for mild pain or headache.   digoxin 0.125 MG tablet Commonly known as: LANOXIN Take 1 tablet (0.125 mg total) by mouth daily. Start taking on: Jul 23, 2019 Notes to patient: 07/23/19   furosemide 40 MG tablet Commonly known as: Lasix Take 1 tablet (40 mg total) by mouth daily. Notes to patient: 07/23/19   potassium chloride SA 20 MEQ tablet Commonly known as: KLOR-CON Take 1 tablet (20 mEq total) by mouth daily. Start taking on: Jul 23, 2019 Notes to patient: 07/23/19   sacubitril-valsartan 24-26 MG Commonly known as: ENTRESTO Take 1 tablet by mouth 2 (two) times daily. Notes to patient: 07/22/19  spironolactone 25 MG tablet Commonly known as: ALDACTONE Take 0.5 tablets (12.5 mg total) by mouth daily. Start taking on: Jul 23, 2019 Notes to patient: 07/23/19   SUMAtriptan 50 MG tablet Commonly known as: IMITREX Take 50 mg by mouth every 2 (two) hours as needed for migraine. May repeat in 2 hours if headache persists or recurs.      Follow-up Information    [facility] Follow up on 07/31/2019.   Specialty: Cardiology Why: 1000. Garage Code 5008  at 1000. Garage Code [employer]  Contact information: [address] [phone] mc [address] [place] [address] [phone]       Kallie Locks, FNP Follow up in 1 week(s).   Specialty: Family Medicine Contact information: [address] [place] [address] [phone]            Time coordinating discharge: 35 min  Signed:  Joseph Art DO  [facility] 07/22/2019, 2:56 PM

## 2019-07-24 ENCOUNTER — Telehealth (HOSPITAL_COMMUNITY): Payer: Self-pay | Admitting: Licensed Clinical Social Worker

## 2019-07-24 NOTE — Telephone Encounter (Signed)
Pt called CSW back regarding medication and transportation assistance.  Pt confirms that it has been hard for her to pay for medications and that was part of the reason she was noncompliant with medications prior to recent admission.  CSW discussed HF Fund and pt is agreeable to getting medications through [facility] pharmacy so she can get them at no cost.  CSW saw where [facility] team discuss copay card for [facility] for pt while in the hospital but pt would not be eligible for copay card as she has no insurance at this time.  CSW explained that we would need to apply for [employer] patient assistance so pt could get medication for free in the mail- pt expressed understanding- we will plan to complete during clinic visit next week.  CSW inquired about pt insurance status.  States she works full time but is unsure if she can get insurance through her work.  Pt states she applied for Medicaid about 1.5 years ago but was denied- states that someone with [facility] contacted her and would be helping with Medicaid app- CSW confirmed through [facility] notes that [employer] will be involved in helping her apply for Medicaid.  CSW then inquired about transportation concerns.  Pt states she does drive but is usually able to get where she needs to through her sister, ordering an uber, or public transit.  No concerns with making it to appt next Monday.  CSW encouraged pt to save CSW number and call if she has any concerns or is unable to get transportation to any upcoming cardiology appts.  CSW will continue to follow and assist as needed  Burna Sis, LCSW Clinical Social Worker [facility] Desk#: [phone] Cell#: [phone]

## 2019-07-24 NOTE — Telephone Encounter (Signed)
CSW consulted to speak with pt regarding medication cost and transportation concerns.  Unable to reach pt- left message requesting return call  Burna Sis, LCSW Clinical Social Worker [facility] Desk#: [phone] Cell#: [phone]

## 2019-07-28 ENCOUNTER — Encounter: Payer: Self-pay | Admitting: Family Medicine

## 2019-07-31 ENCOUNTER — Telehealth (HOSPITAL_COMMUNITY): Payer: Self-pay | Admitting: Licensed Clinical Social Worker

## 2019-07-31 ENCOUNTER — Encounter (HOSPITAL_COMMUNITY): Payer: Self-pay

## 2019-07-31 ENCOUNTER — Telehealth (HOSPITAL_COMMUNITY): Payer: Self-pay | Admitting: *Deleted

## 2019-07-31 ENCOUNTER — Other Ambulatory Visit: Payer: Self-pay

## 2019-07-31 ENCOUNTER — Ambulatory Visit (HOSPITAL_COMMUNITY)
Admit: 2019-07-31 | Discharge: 2019-07-31 | Disposition: A | Discharge status: Home / Self Care | Payer: Self-pay | Source: Ambulatory Visit | Attending: Adult Health | Admitting: Adult Health

## 2019-07-31 VITALS — BP 102/64 | HR 71 | Wt 156.4 lb

## 2019-07-31 DIAGNOSIS — Z8249 Family history of ischemic heart disease and other diseases of the circulatory system: Secondary | ICD-10-CM | POA: Insufficient documentation

## 2019-07-31 DIAGNOSIS — I429 Cardiomyopathy, unspecified: Secondary | ICD-10-CM | POA: Insufficient documentation

## 2019-07-31 DIAGNOSIS — I110 Hypertensive heart disease with heart failure: Secondary | ICD-10-CM | POA: Insufficient documentation

## 2019-07-31 DIAGNOSIS — I10 Essential (primary) hypertension: Secondary | ICD-10-CM

## 2019-07-31 DIAGNOSIS — I5022 Chronic systolic (congestive) heart failure: Secondary | ICD-10-CM | POA: Insufficient documentation

## 2019-07-31 DIAGNOSIS — Z7901 Long term (current) use of anticoagulants: Secondary | ICD-10-CM | POA: Insufficient documentation

## 2019-07-31 DIAGNOSIS — Z86711 Personal history of pulmonary embolism: Secondary | ICD-10-CM | POA: Insufficient documentation

## 2019-07-31 DIAGNOSIS — Z79899 Other long term (current) drug therapy: Secondary | ICD-10-CM | POA: Insufficient documentation

## 2019-07-31 DIAGNOSIS — I255 Ischemic cardiomyopathy: Secondary | ICD-10-CM | POA: Insufficient documentation

## 2019-07-31 DIAGNOSIS — Z87891 Personal history of nicotine dependence: Secondary | ICD-10-CM | POA: Insufficient documentation

## 2019-07-31 MED ORDER — CARVEDILOL 3.125 MG PO TABS
3.1250 mg | ORAL_TABLET | Freq: Two times a day (BID) | ORAL | 3 refills | Status: DC
Start: 2019-07-31 — End: 2019-08-22

## 2019-07-31 NOTE — Telephone Encounter (Signed)
[facility] lab called to report specimen was hemolyzed and they could not run bmet. Kristen Loader aware.

## 2019-07-31 NOTE — Telephone Encounter (Signed)
Lab called back not enough sample to run digoxin.

## 2019-07-31 NOTE — Progress Notes (Signed)
PCP: Raliegh Ip FNP  Primary Cardiologist: Dr Gala Romney   HPI: Alexandria Rhodes a 44 y.o.femalewith a historyofnon-ischemic cardiomyopathy with EF as low as 10% in 2016but most recent 65-70% on Echo in 03/2018, prior PE in 03/2018 no longer on anticoagulation, migraine, and hypertension.  She was admitted to a hospital in [place] in 08/2014 after presenting with chest pain and shortness of breath.  She was found to have an EF of 10%. She states she underwent heart cath which showed no CAD. She was [facility] [place].  She was started on guideline medical therapy and was on Entresto 97-103 mg twice daily,  Coreg 6.25 mg twice daily, and Spironolactone 12.5 mg daily.  She moved to [place] at the end of 2019.  Admitted to [facility] in 03/2018 with chest pain and found to have an acute PE felt to be provoked by recent move from [place]. Echo at that time showed LVEF of 65-70% with normal wall motion, grade 1 diastolic dysfunction, trivial MR, normal biatrial size, normal RV function, and dilated IVC.  She was started on Eliquis but was only able to complete 1 month of therapy due to cost.  She also could not afford her heart failure medications and has not been on anything in the past year.  Admitted to [facility] on 07/20/19 with increased shortness of breath. She  Had been out of her medications for a week. Diuresed with IV lasix and started on HF meds. D/C weight 156 pounds. Given all medications from HF fund at discharge.   Today she returns for HF follow up.Overall feeling fine. Denies SOB/PND/Orthopnea. Appetite ok. No fever or chills. Weight at home trending down 156-->153 pounds. Taking all medications. She has a hard time paying for medications. Lives with her sister. She does not smoke or drink alcohol. Prior to her recent hospitalization she was working at [employer]. She has no income. She has not had a menstrual cycle since 2016 and says  she is not sexually active.   ECHO 07/2019-EF 20-25% Grade II DD ECHO 03/2018 EF 65-70%    ROS: All systems negative except as listed in HPI, PMH and Problem List.  SH:  Social History   Socioeconomic History  . Marital status: Single    Spouse name: Not on file  . Number of children: Not on file  . Years of education: Not on file  . Highest education level: Not on file  Occupational History  . Not on file  Tobacco Use  . Smoking status: Former Games developer  . Smokeless tobacco: Never Used  Substance and Sexual Activity  . Alcohol use: Not Currently  . Drug use: Not Currently  . Sexual activity: Yes  Other Topics Concern  . Not on file  Social History Narrative  . Not on file   Social Determinants of Health   Financial Resource Strain:   . Difficulty of Paying Living Expenses:   Food Insecurity:   . Worried About Programme researcher, broadcasting/film/video in the Last Year:   . Barista in the Last Year:   Transportation Needs: No Transportation Needs  . Lack of Transportation (Medical): No  . Lack of Transportation (Non-Medical): No  Physical Activity:   . Days of Exercise per Week:   . Minutes of Exercise per Session:   Stress:   . Feeling of Stress :   Social Connections:   . Frequency of Communication with Friends and  Family:   . Frequency of Social Gatherings with Friends and Family:   . Attends Religious Services:   . Active Member of Clubs or Organizations:   . Attends Archivist Meetings:   Marland Kitchen Marital Status:   Intimate Partner Violence:   . Fear of Current or Ex-Partner:   . Emotionally Abused:   Marland Kitchen Physically Abused:   . Sexually Abused:     FH:  Family History  Problem Relation Age of Onset  . Hypertension Mother     Past Medical History:  Diagnosis Date  . Acute pulmonary embolism ([place])   . Cardiomyopathy ([place])   . CHF (congestive heart failure) ([place])   . Hypertension   . Non-ischemic cardiomyopathy ([place]) 07/2019    Current Outpatient Medications   Medication Sig Dispense Refill  . acetaminophen (TYLENOL) 500 MG tablet Take 1,000-1,500 mg by mouth 2 (two) times daily as needed for mild pain or headache.    . digoxin (LANOXIN) 0.125 MG tablet Take 1 tablet (0.125 mg total) by mouth daily. 30 tablet 0  . furosemide (LASIX) 40 MG tablet Take 1 tablet (40 mg total) by mouth daily. 30 tablet 0  . potassium chloride SA (KLOR-CON) 20 MEQ tablet Take 1 tablet (20 mEq total) by mouth daily. 30 tablet 0  . sacubitril-valsartan (ENTRESTO) 24-26 MG Take 1 tablet by mouth 2 (two) times daily. 60 tablet 0  . spironolactone (ALDACTONE) 25 MG tablet Take 0.5 tablets (12.5 mg total) by mouth daily. 30 tablet 0  . SUMAtriptan (IMITREX) 50 MG tablet Take 50 mg by mouth every 2 (two) hours as needed for migraine. May repeat in 2 hours if headache persists or recurs.     No current facility-administered medications for this encounter.    Vitals:   07/31/19 0942  BP: 102/64  Pulse: 71  SpO2: 99%  Weight: 70.9 kg   Wt Readings from Last 3 Encounters:  07/31/19 70.9 kg  07/22/19 70.9 kg  06/06/18 84.8 kg   PHYSICAL EXAM: General:  Well appearing. No resp difficulty HEENT: normal Neck: Rash on her neck. Supple. JVP flat. Carotids 2+ bilaterally; no bruits. No lymphadenopathy or thryomegaly appreciated. Cor: PMI normal. Regular rate & rhythm. No rubs, gallops or murmurs. Lungs: clear Abdomen: soft, nontender, nondistended. No hepatosplenomegaly. No bruits or masses. Good bowel sounds. Extremities: no cyanosis, clubbing, rash, edema Neuro: alert & orientedx3, cranial nerves grossly intact. Moves all 4 extremities w/o difficulty. Affect pleasant.   ASSESSMENT & PLAN:  1. Chronic Combined Heart Failure  - EF as low as 10% back in 2016. She was started on guideline based medications and EF improved to 65-70%on echo in 03/2018.  -ECHO this admit  EF 20-25% with global hypokinesis, grade II diastolic dysfunction, severely dilated left atrium,  moderate MR, and trivial AI. RV size normal with systolic function mildly reduced. - Plan to repeat ECHO in 3 months after HF medications optimized.  NYHA II. Volume status stable. Continue lasix 40 mg daily - Add coreg 3.125 mg twice a day.  - Continue digoxin 0.125 mg daily, check dig level.  - Continue entresto 24-26 mg twice a day - Continue spironolactone 12.5 mg daily - Check BMET and dig level today.  - Discussed daily weights and medication compliance.   2. HTN Stable.     3. History of PE  4. Social  Has difficulty with transportation and paying for medications. Referred to HF SW     Follow up in 2 weeks. Discussed medications  changes.   Kass Herberger NP-C  9:50 AM

## 2019-07-31 NOTE — Progress Notes (Signed)
CSW met with pt during clinic visit to complete [employer] application.  Patient portion completed and left for MD portion to be reviewed and signed.  Also check in regarding insurance as patient was working with Karel Jarvis to complete Medicaid application.  Pt reports she has received the paperwork from them and plans to call today to see about next steps.  CSW will continue to follow and assist as needed  Jorge Ny, [facility] Desk#: [phone] Cell#: [phone]

## 2019-07-31 NOTE — Telephone Encounter (Signed)
Completed Therapist, nutritional for [employer] assistance sent in for review- fax confirmation received- awaiting review  Burna Sis, LCSW Clinical Social Worker [facility] Desk#: [phone] Cell#: [phone]

## 2019-07-31 NOTE — Patient Instructions (Addendum)
START Coreg 3.125mg  (1 tab) twice a day  Labs today We will only contact you if something comes back abnormal or we need to make some changes. Otherwise no news is good news!  Your physician recommends that you schedule a follow-up appointment in: 3 week with the Nurse Practitioner  Tuesday June 8th, 2021 at 8:30a  Garage code [id number]  Please call office at [phone] option 2 if you have any questions or concerns.   At the [facility], you and your health needs are our priority. As part of our continuing mission to provide you with exceptional heart care, we have created designated Provider Care Teams. These Care Teams include your primary Cardiologist (physician) and Advanced Practice Providers (APPs- Physician Assistants and Nurse Practitioners) who all work together to provide you with the care you need, when you need it.   You may see any of the following providers on your designated Care Team at your next follow up: Marland Kitchen Dr Arvilla Meres . Dr Marca Ancona . Tonye Becket, NP . [name], PA . Karle Plumber, PharmD   Please be sure to bring in all your medications bottles to every appointment.

## 2019-08-02 ENCOUNTER — Telehealth (HOSPITAL_COMMUNITY): Payer: Self-pay | Admitting: Pharmacy Technician

## 2019-08-02 NOTE — Telephone Encounter (Signed)
Advanced [facility] Failure Patient Advocate Encounter   Patient was approved to receive Entresto from [employer]  Patient ID: [id number] Effective dates: 08/02/19 through 08/01/2020  Called patient to inform her of approval, could not leave a message.  Will follow up.  Archer Asa, CPhT

## 2019-08-08 ENCOUNTER — Telehealth (HOSPITAL_COMMUNITY): Payer: Self-pay | Admitting: Licensed Clinical Social Worker

## 2019-08-08 NOTE — Telephone Encounter (Signed)
CSW received phone call from pt informing that her phone is not currently working and inquiring about the status of her patient assistance application with [employer] because she was worried she had missed a call without her phone working.  CSW informed pt that she had been approved for [employer] for her Sherryll Burger and provided pt with phone number for [employer] to call and set up initial shipment.    CSW encouraged pt to reach out to inform if she is unable to restart her phone and needs to give [place] a new number to contact her by.  Burna Sis, LCSW Clinical Social Worker [facility] Desk#: [phone] Cell#: [phone]

## 2019-08-15 ENCOUNTER — Telehealth (HOSPITAL_COMMUNITY): Payer: Self-pay | Admitting: Licensed Clinical Social Worker

## 2019-08-15 NOTE — Telephone Encounter (Signed)
CSW received call from pt requesting help with transport to appt next week.  CSW able to set up ride to and from clinic appt through [employer].  Pt also inquired about her pending Medicaid app that [employer] is assisting her with.  Pt being followed by Cathlean Marseilles- CSW left voicemail for her to inquire and make sure they have pt current phone number as her personal phone isn't working and she is borrowing her friends phone.  Pt also asked about getting letter for work to explain she is not medically safe to go back at this time.  CSW sent inbasket to clinic CMA to help follow up.  CSW will continue to follow and assist as needed  Burna Sis, LCSW Clinical Social Worker [facility] Desk#: [phone] Cell#: [phone]

## 2019-08-16 NOTE — Telephone Encounter (Signed)
Spoke with patient who has received her first shipment of Entresto from [employer].  Archer Asa, CPhT

## 2019-08-17 ENCOUNTER — Encounter (HOSPITAL_COMMUNITY): Payer: Self-pay | Admitting: Cardiology

## 2019-08-22 ENCOUNTER — Other Ambulatory Visit: Payer: Self-pay

## 2019-08-22 ENCOUNTER — Encounter (HOSPITAL_COMMUNITY): Payer: Self-pay

## 2019-08-22 ENCOUNTER — Ambulatory Visit (HOSPITAL_COMMUNITY)
Admission: RE | Admit: 2019-08-22 | Discharge: 2019-08-22 | Disposition: A | Discharge status: Home / Self Care | Payer: Self-pay | Source: Ambulatory Visit | Attending: Cardiology | Admitting: Cardiology

## 2019-08-22 VITALS — BP 92/74 | HR 74 | Wt 153.6 lb

## 2019-08-22 DIAGNOSIS — I5022 Chronic systolic (congestive) heart failure: Secondary | ICD-10-CM | POA: Insufficient documentation

## 2019-08-22 DIAGNOSIS — Z86711 Personal history of pulmonary embolism: Secondary | ICD-10-CM | POA: Insufficient documentation

## 2019-08-22 DIAGNOSIS — Z87891 Personal history of nicotine dependence: Secondary | ICD-10-CM | POA: Insufficient documentation

## 2019-08-22 DIAGNOSIS — Z79899 Other long term (current) drug therapy: Secondary | ICD-10-CM | POA: Insufficient documentation

## 2019-08-22 DIAGNOSIS — I428 Other cardiomyopathies: Secondary | ICD-10-CM | POA: Insufficient documentation

## 2019-08-22 DIAGNOSIS — Z8249 Family history of ischemic heart disease and other diseases of the circulatory system: Secondary | ICD-10-CM | POA: Insufficient documentation

## 2019-08-22 DIAGNOSIS — I110 Hypertensive heart disease with heart failure: Secondary | ICD-10-CM | POA: Insufficient documentation

## 2019-08-22 LAB — BASIC METABOLIC PANEL
Anion gap: 13 (ref 5–15)
BUN: 20 mg/dL (ref 6–20)
CO2: 27 mmol/L (ref 22–32)
Calcium: 10.4 mg/dL — ABNORMAL HIGH (ref 8.9–10.3)
Chloride: 102 mmol/L (ref 98–111)
Creatinine, Ser: 1.09 mg/dL — ABNORMAL HIGH (ref 0.44–1.00)
GFR calc Af Amer: >60 mL/min (ref >60)
GFR calc non Af Amer: >60 mL/min (ref >60)
Glucose, Bld: 107 mg/dL — ABNORMAL HIGH (ref 70–99)
Potassium: 5.1 mmol/L (ref 3.5–5.1)
Sodium: 142 mmol/L (ref 135–145)

## 2019-08-22 LAB — DIGOXIN LEVEL: Digoxin Level: 0.4 ng/mL — ABNORMAL LOW (ref 0.8–2.0)

## 2019-08-22 MED ORDER — SPIRONOLACTONE 25 MG PO TABS: 12.5000 mg | ORAL_TABLET | Freq: Every day | ORAL | 2 refills | Status: DC

## 2019-08-22 MED ORDER — POTASSIUM CHLORIDE CRYS ER 20 MEQ PO TBCR: 20.0000 meq | EXTENDED_RELEASE_TABLET | ORAL | 2 refills | Status: DC | PRN

## 2019-08-22 MED ORDER — FUROSEMIDE 40 MG PO TABS: 40.0000 mg | ORAL_TABLET | ORAL | 2 refills | Status: DC | PRN

## 2019-08-22 MED ORDER — CARVEDILOL 3.125 MG PO TABS: 3.1250 mg | ORAL_TABLET | Freq: Two times a day (BID) | ORAL | 2 refills | Status: DC

## 2019-08-22 NOTE — Patient Instructions (Signed)
STOP Digoxin CHANGE Lasix to as needed for 3lb weight gain overnight or 5 lb weight gain in a week CHANGE Potassium to as needed with every dose of Lasix  Your physician recommends that you schedule a follow-up appointment in: 3-4 weeks with the Lauren K, Pharm D  Do the following things EVERYDAY: 1) Weigh yourself in the morning before breakfast. Write it down and keep it in a log. 2) Take your medicines as prescribed 3) Eat low salt foods--Limit salt (sodium) to 2000 mg per day.  4) Stay as active as you can everyday 5) Limit all fluids for the day to less than 2 liters  At the [facility], you and your health needs are our priority. As part of our continuing mission to provide you with exceptional heart care, we have created designated Provider Care Teams. These Care Teams include your primary Cardiologist (physician) and Advanced Practice Providers (APPs- Physician Assistants and Nurse Practitioners) who all work together to provide you with the care you need, when you need it.   You may see any of the following providers on your designated Care Team at your next follow up: Marland Kitchen Dr Arvilla Meres . Dr Marca Ancona . Tonye Becket, NP . [name], PA . Karle Plumber, PharmD   Please be sure to bring in all your medications bottles to every appointment.

## 2019-08-22 NOTE — Progress Notes (Signed)
PCP: Raliegh Ip FNP  Primary Cardiologist: Dr Gala Romney   Reason for visit: f/u for chronic systolic HF, medication management   HPI: Alexandria Rhodes a 44 y.o.femalewith a historyofnon-ischemic cardiomyopathy with EF as low as 10% in 2016but most recent 65-70% on Echo in 03/2018, prior PE in 03/2018 no longer on anticoagulation, migraine, and hypertension.  She was admitted to a hospital in [place] in 08/2014 after presenting with chest pain and shortness of breath.  She was found to have an EF of 10%. She states she underwent heart cath which showed no CAD. She was [facility] [place].  She was started on guideline medical therapy and was on Entresto 97-103 mg twice daily,  Coreg 6.25 mg twice daily, and Spironolactone 12.5 mg daily.  She moved to [place] at the end of 2019.  Admitted to [facility] in 03/2018 with chest pain and found to have an acute PE felt to be provoked by recent move from [place]. Echo at that time showed LVEF of 65-70% with normal wall motion, grade 1 diastolic dysfunction, trivial MR, normal biatrial size, normal RV function, and dilated IVC.  She was started on Eliquis but was only able to complete 1 month of therapy due to cost.  She also could not afford her heart failure medications and has not been on anything in the past year.  Admitted to [facility] on 07/20/19 with increased shortness of breath. She  Had been out of her medications for a week. Diuresed with IV lasix and started on HF meds. D/C weight 156 pounds. Given all medications from HF fund at discharge. Referred to HF social work to help w/ meds and transportation to and from appts.   She returns to clinic today for f/u. Volume status stable. Wt down another 3 lb since last OV. Euvolemic on exam. NYHA Class II symptoms. No orthopnea/PND. BP is soft, low 90s systolic. Notes occasional dizziness/lightheadness. Remains on daily Lasix. Has scale at home and  home wts have been stable.   Her only complaint is a rash on her anterior neck and bilateral arms (anticubital fossas). She has h/o eczema w/ similar rash pattern but this is worse and started after she was discharged from the hospital. Rash is pruritic. Denies associated dyspnea, wheezing, facial swelling. She was previously taking the same HF meds in [place] w/o side effects. The only new medication that she had not been on previously is digoxin.   Social: She has a hard time paying for medications. Lives with her sister. She does not smoke or drink alcohol. Prior to her recent hospitalization she was working at [employer]. She has no income. She has not had a menstrual cycle since 2016 and says she is not sexually active.   ECHO 07/2019-EF 20-25% Grade II DD ECHO 03/2018 EF 65-70%    ROS: All systems negative except as listed in HPI, PMH and Problem List.  SH:  Social History   Socioeconomic History  . Marital status: Single    Spouse name: Not on file  . Number of children: Not on file  . Years of education: Not on file  . Highest education level: Not on file  Occupational History  . Not on file  Tobacco Use  . Smoking status: Former Games developer  . Smokeless tobacco: Never Used  Substance and Sexual Activity  . Alcohol use: Not Currently  . Drug use: Not Currently  . Sexual activity: Yes  Other Topics Concern  . Not on file  Social History Narrative  . Not on file   Social Determinants of Health   Financial Resource Strain:   . Difficulty of Paying Living Expenses:   Food Insecurity:   . Worried About Programme researcher, broadcasting/film/video in the Last Year:   . Barista in the Last Year:   Transportation Needs: No Transportation Needs  . Lack of Transportation (Medical): No  . Lack of Transportation (Non-Medical): No  Physical Activity:   . Days of Exercise per Week:   . Minutes of Exercise per Session:   Stress:   . Feeling of Stress :   Social Connections:   . Frequency of  Communication with Friends and Family:   . Frequency of Social Gatherings with Friends and Family:   . Attends Religious Services:   . Active Member of Clubs or Organizations:   . Attends Banker Meetings:   Marland Kitchen Marital Status:   Intimate Partner Violence:   . Fear of Current or Ex-Partner:   . Emotionally Abused:   Marland Kitchen Physically Abused:   . Sexually Abused:     FH:  Family History  Problem Relation Age of Onset  . Hypertension Mother     Past Medical History:  Diagnosis Date  . Acute pulmonary embolism ([facility])   . Cardiomyopathy ([facility])   . CHF (congestive heart failure) ([facility])   . Hypertension   . Non-ischemic cardiomyopathy ([facility]) 07/2019    Current Outpatient Medications  Medication Sig Dispense Refill  . sacubitril-valsartan (ENTRESTO) 24-26 MG Take 1 tablet by mouth 2 (two) times daily. 60 tablet 0  . acetaminophen (TYLENOL) 500 MG tablet Take 1,000-1,500 mg by mouth 2 (two) times daily as needed for mild pain or headache.    . carvedilol (COREG) 3.125 MG tablet Take 1 tablet (3.125 mg total) by mouth 2 (two) times daily. (Patient not taking: Reported on 08/22/2019) 60 tablet 3  . digoxin (LANOXIN) 0.125 MG tablet Take 1 tablet (0.125 mg total) by mouth daily. (Patient not taking: Reported on 08/22/2019) 30 tablet 0  . furosemide (LASIX) 40 MG tablet Take 1 tablet (40 mg total) by mouth daily. (Patient not taking: Reported on 08/22/2019) 30 tablet 0  . potassium chloride SA (KLOR-CON) 20 MEQ tablet Take 1 tablet (20 mEq total) by mouth daily. (Patient not taking: Reported on 08/22/2019) 30 tablet 0  . spironolactone (ALDACTONE) 25 MG tablet Take 0.5 tablets (12.5 mg total) by mouth daily. (Patient not taking: Reported on 08/22/2019) 30 tablet 0  . SUMAtriptan (IMITREX) 50 MG tablet Take 50 mg by mouth every 2 (two) hours as needed for migraine. May repeat in 2 hours if headache persists or recurs.     No current facility-administered medications for this encounter.     Vitals:   08/22/19 0833  BP: 92/74  Pulse: 74  SpO2: 98%  Weight: 69.7 kg (153 lb 9.6 oz)   Wt Readings from Last 3 Encounters:  08/22/19 69.7 kg (153 lb 9.6 oz)  07/31/19 70.9 kg (156 lb 6.4 oz)  07/22/19 70.9 kg (156 lb 4.8 oz)   PHYSICAL EXAM: General:  Well appearing. No respiratory difficulty HEENT: normal Neck: supple. no JVD. Carotids 2+ bilat; no bruits. No lymphadenopathy or thyromegaly appreciated. + maculopaupar rash on base of anterior neck extending to mid portion of anterior chest  Cor: PMI nondisplaced. Regular rate & rhythm. No rubs, gallops or murmurs. Lungs: clear Abdomen: soft, nontender, nondistended. No hepatosplenomegaly.  No bruits or masses. Good bowel sounds. Extremities: no cyanosis, clubbing, rash, edema Neuro: alert & oriented x 3, cranial nerves grossly intact. moves all 4 extremities w/o difficulty. Affect pleasant.    ASSESSMENT & PLAN:  1. Chronic Combined Heart Failure  - EF as low as 10% back in 2016. She was started on guideline based medications and EF improved to 65-70%on echo in 03/2018.  -ECHO 5/21  EF back down to 20-25% (in the setting of poor med compliance) with global hypokinesis, grade II diastolic dysfunction, severely dilated left atrium, moderate MR, and trivial AI. RV size normal with systolic function mildly reduced. GDMT restarted.  - Plan to repeat ECHO in 3 months after HF medications optimized.  - NYHA II. Volume status stable. Wt down 3 lb from last OV. BP is soft, low 34L systolic - Stop daily Lasix and KCl. Change to PRN based on daily wts.  - Continue coreg 3.125 mg twice a day.  - Will stop digoxin for now, as this may be potential culprit for rash, as onset correlates w/ time medication was started. If no improvement w/ discontinuation, can restart at next f/u visit. Will check dig level today  - Continue entresto 24-26 mg twice a day. BP too soft for titration. Has rash but no symptoms of angioedema.  - Continue  spironolactone 12.5 mg daily - Check BMET   - Discussed daily weights and medication compliance.  - Hopefully w/ discontinuation of Lasix, there will be more BP room to further titrate meds at next f/u.  - we discussed potential teratogenic effects of Entresto.  She reports that she has not had a menstrual cycle since 2016 and says she is not sexually active.     2. HTN - soft but stable. Change lasix to PRN per above.  - continue Entresto, Coreg and sprio at current doses    3. History of PE - provoked in the setting of prolonged travel - treated w/ Eliquis. No longer on a/c   4. Social  - Has difficulty with transportation and paying for medications.  - she is being followed by our HF SW team and meds supplied through the HF fund    F/u w/ PharmD in 3-4 weeks for further med titration.    Alejo Beamer  PA-C  8:35 AM

## 2019-09-19 ENCOUNTER — Ambulatory Visit: Payer: Self-pay | Admitting: Family Medicine

## 2019-09-20 ENCOUNTER — Inpatient Hospital Stay (HOSPITAL_COMMUNITY)
Admission: RE | Admit: 2019-09-20 | Discharge: 2019-09-20 | Disposition: A | Discharge status: Home / Self Care | Payer: Self-pay | Source: Ambulatory Visit

## 2019-09-22 MED FILL — CARVEDILOL 3.125 MG TABLET: 3.125 | 34 days supply | Qty: 68 | Fill #1

## 2019-09-22 MED FILL — SPIRONOLACTONE 25 MG TABS: 25 | 34 days supply | Qty: 17 | Fill #1

## 2019-10-09 ENCOUNTER — Encounter (HOSPITAL_COMMUNITY): Payer: Self-pay

## 2019-10-26 MED FILL — POTASSIUM CHLORIDE CRYS ER: 20 | 34 days supply | Qty: 34 | Fill #1

## 2019-10-26 MED FILL — FUROSEMIDE 40 MG TAB: 40 | 34 days supply | Qty: 100 | Fill #1

## 2019-10-26 MED FILL — CARVEDILOL 3.125 MG TABLET: 3.125 | 34 days supply | Qty: 68 | Fill #2

## 2019-10-26 MED FILL — SPIRONOLACTONE 25 MG TABS: 25 | 34 days supply | Qty: 17 | Fill #2

## 2019-11-24 ENCOUNTER — Telehealth (HOSPITAL_COMMUNITY): Payer: Self-pay | Admitting: Adult Health

## 2019-11-24 NOTE — Telephone Encounter (Signed)
Error

## 2019-11-28 ENCOUNTER — Encounter (HOSPITAL_COMMUNITY): Payer: Self-pay

## 2019-11-28 ENCOUNTER — Other Ambulatory Visit (HOSPITAL_COMMUNITY): Payer: Self-pay

## 2019-12-06 ENCOUNTER — Inpatient Hospital Stay (HOSPITAL_COMMUNITY): Admission: RE | Admit: 2019-12-06 | Payer: Self-pay | Source: Ambulatory Visit

## 2019-12-08 ENCOUNTER — Telehealth (HOSPITAL_COMMUNITY): Payer: Self-pay | Admitting: Licensed Clinical Social Worker

## 2019-12-08 NOTE — Telephone Encounter (Signed)
CSW consulted to help pt with transportation to appt next week.  Able to set up through [employer]- pt aware of transport details and has their number if anything changes.  Will continue to follow and assist as needed  Burna Sis, LCSW Clinical Social Worker [facility] Desk#: [phone] Cell#: [phone]

## 2019-12-14 ENCOUNTER — Other Ambulatory Visit: Payer: Self-pay

## 2019-12-14 ENCOUNTER — Encounter (HOSPITAL_COMMUNITY): Payer: Self-pay

## 2019-12-14 ENCOUNTER — Ambulatory Visit (HOSPITAL_COMMUNITY)
Admission: RE | Admit: 2019-12-14 | Discharge: 2019-12-14 | Disposition: A | Discharge status: Home / Self Care | Payer: Self-pay | Source: Ambulatory Visit | Attending: Cardiology | Admitting: Cardiology

## 2019-12-14 VITALS — BP 105/70 | HR 55 | Wt 160.8 lb

## 2019-12-14 DIAGNOSIS — Z79899 Other long term (current) drug therapy: Secondary | ICD-10-CM | POA: Insufficient documentation

## 2019-12-14 DIAGNOSIS — I110 Hypertensive heart disease with heart failure: Secondary | ICD-10-CM | POA: Insufficient documentation

## 2019-12-14 DIAGNOSIS — I5022 Chronic systolic (congestive) heart failure: Secondary | ICD-10-CM

## 2019-12-14 DIAGNOSIS — Z86711 Personal history of pulmonary embolism: Secondary | ICD-10-CM | POA: Insufficient documentation

## 2019-12-14 DIAGNOSIS — Z8249 Family history of ischemic heart disease and other diseases of the circulatory system: Secondary | ICD-10-CM | POA: Insufficient documentation

## 2019-12-14 DIAGNOSIS — Z87891 Personal history of nicotine dependence: Secondary | ICD-10-CM | POA: Insufficient documentation

## 2019-12-14 DIAGNOSIS — I428 Other cardiomyopathies: Secondary | ICD-10-CM | POA: Insufficient documentation

## 2019-12-14 LAB — HEMOGLOBIN A1C
Hgb A1c MFr Bld: 5.3 % (ref 4.8–5.6)
Mean Plasma Glucose: 105.41 mg/dL

## 2019-12-14 MED ORDER — DAPAGLIFLOZIN PROPANEDIOL 10 MG PO TABS
10.0000 mg | ORAL_TABLET | Freq: Every day | ORAL | 3 refills | Status: DC
Start: 2019-12-14 — End: 2020-01-08

## 2019-12-14 MED FILL — POTASSIUM CHLORIDE CRYS ER: 20 | 34 days supply | Qty: 34 | Fill #2

## 2019-12-14 MED FILL — FARXIGA 10 MG TABLET: 10 | 30 days supply | Qty: 30 | Fill #0

## 2019-12-14 NOTE — Patient Instructions (Signed)
START Farxiga 10mg  Daily  RESTART Spironolactone 12.5mg  (1/2 tablet) Dailly  Labs done today, your results will be available in MyChart, we will contact you for abnormal readings.  Your physician recommends that you return for:   Repeat labs in 7-10 days   Pharmacy Visit in 3-4 weeks   Follow up with APP clinic in 8 weeks  If you have any questions or concerns before your next appointment please send 9-10 a message through [place] or call our office at [phone].    TO LEAVE A MESSAGE FOR THE NURSE SELECT OPTION 2, PLEASE LEAVE A MESSAGE INCLUDING:  YOUR NAME  DATE OF BIRTH  CALL BACK NUMBER  REASON FOR CALL**this is important as we prioritize the call backs  YOU WILL RECEIVE A CALL BACK THE SAME DAY AS LONG AS YOU CALL BEFORE 4:00 PM

## 2019-12-14 NOTE — Progress Notes (Signed)
PCP: Raliegh Ip FNP  Primary Cardiologist: Dr Gala Romney   Reason for visit: f/u for chronic systolic HF, medication management   HPI: Alexandria Rhodes a 44 y.o.femalewith a historyofnon-ischemic cardiomyopathy with EF as low as 10% in 2016but most recent 65-70% on Echo in 03/2018, prior PE in 03/2018 no longer on anticoagulation, migraine, and hypertension.  She was admitted to a hospital in [place] in 08/2014 after presenting with chest pain and shortness of breath.  She was found to have an EF of 10%. She states she underwent heart cath which showed no CAD. [facility] [place].  She was started on guideline medical therapy and was on Entresto 97-103 mg twice daily,  Coreg 6.25 mg twice daily, and Spironolactone 12.5 mg daily.  She moved to [place] at the end of 2019.  Admitted to [facility] in 03/2018 with chest pain and found to have an acute PE felt to be provoked by recent move from [place]. Echo at that time showed LVEF of 65-70% with normal wall motion, grade 1 diastolic dysfunction, trivial MR, normal biatrial size, normal RV function, and dilated IVC.  She was started on Eliquis but was only able to complete 1 month of therapy due to cost.  She also could not afford her heart failure medications and has not been on anything in the past year.  Admitted to [facility] on 07/20/19 with increased shortness of breath. She  Had been out of her medications for a week. Diuresed with IV lasix and started on HF meds. D/C weight 156 pounds. Given all medications from HF fund at discharge. Referred to HF social work to help w/ meds and transportation to and from appts.   She was seen for initial post hospital follow-up back in June and was doing fairly well from a cardiac standpoint.  She did however endorse a rash on her torso and upper extremities which she felt was most likely secondary to digoxin since it was a new medication that had  just been started.  Digoxin was discontinued.  Due to soft BP, we were unable to further titrate her medications at that time.  She was instructed to follow-up with pharmacy 2 to 3 weeks later for further med titration however she canceled the appointment and did not reschedule.   She returns back today for follow-up.  She is currently working at [employer].  She reports compliance with most of her meds but she ran out of spiro and Coreg 3 days ago. HR 55 bpm off  blocker. BP soft 105/70. Wt up 7 lb since last OV.  Reports stable NYHA class II symptoms.   ECHO 07/2019-EF 20-25% Grade II DD ECHO 03/2018 EF 65-70%    ROS: All systems negative except as listed in HPI, PMH and Problem List.  SH:  Social History   Socioeconomic History  . Marital status: Single    Spouse name: Not on file  . Number of children: Not on file  . Years of education: Not on file  . Highest education level: Not on file  Occupational History  . Not on file  Tobacco Use  . Smoking status: Former Games developer  . Smokeless tobacco: Never Used  Vaping Use  . Vaping Use: Never used  Substance and Sexual Activity  . Alcohol use: Not Currently  . Drug use: Not Currently  . Sexual activity: Yes  Other Topics Concern  . Not on file  Social History Narrative  . Not on file   Social Determinants of Health   Financial Resource Strain:   . Difficulty of Paying Living Expenses: Not on file  Food Insecurity:   . Worried About Programme researcher, broadcasting/film/video in the Last Year: Not on file  . Ran Out of Food in the Last Year: Not on file  Transportation Needs: No Transportation Needs  . Lack of Transportation (Medical): No  . Lack of Transportation (Non-Medical): No  Physical Activity:   . Days of Exercise per Week: Not on file  . Minutes of Exercise per Session: Not on file  Stress:   . Feeling of Stress : Not on file  Social Connections:   . Frequency of Communication with Friends and Family: Not on file  . Frequency of Social  Gatherings with Friends and Family: Not on file  . Attends Religious Services: Not on file  . Active Member of Clubs or Organizations: Not on file  . Attends Banker Meetings: Not on file  . Marital Status: Not on file  Intimate Partner Violence:   . Fear of Current or Ex-Partner: Not on file  . Emotionally Abused: Not on file  . Physically Abused: Not on file  . Sexually Abused: Not on file    FH:  Family History  Problem Relation Age of Onset  . Hypertension Mother     Past Medical History:  Diagnosis Date  . Acute pulmonary embolism (HCC)   . Cardiomyopathy (HCC)   . CHF (congestive heart failure) (HCC)   . Hypertension   . Non-ischemic cardiomyopathy (HCC) 07/2019    Current Outpatient Medications  Medication Sig Dispense Refill  . acetaminophen (TYLENOL) 500 MG tablet Take 1,000-1,500 mg by mouth 2 (two) times daily as needed for mild pain or headache.    . carvedilol (COREG) 3.125 MG tablet Take 1 tablet (3.125 mg total) by mouth 2 (two) times daily. 60 tablet 2  . furosemide (LASIX) 40 MG tablet Take 1 tablet (40 mg total) by mouth as needed. 30 tablet 2  . potassium chloride SA (KLOR-CON) 20 MEQ tablet Take 1 tablet (20 mEq total) by mouth as needed. 30 tablet 2  . sacubitril-valsartan (ENTRESTO) 24-26 MG Take 1 tablet by mouth 2 (two) times daily. 60 tablet 0  . spironolactone (ALDACTONE) 25 MG tablet Take 0.5 tablets (12.5 mg total) by mouth daily. 15 tablet 2  . SUMAtriptan (IMITREX) 50 MG tablet Take 50 mg by mouth every 2 (two) hours as needed for migraine. May repeat in 2 hours if headache persists or recurs.     No current facility-administered medications for this encounter.    Vitals:   12/14/19 1424  BP: 105/70  Pulse: (!) 55  SpO2: 100%  Weight: 72.9 kg (160 lb 12.8 oz)   Wt Readings from Last 3 Encounters:  12/14/19 72.9 kg (160 lb 12.8 oz)  08/22/19 69.7 kg (153 lb 9.6 oz)  07/31/19 70.9 kg (156 lb 6.4 oz)   PHYSICAL  EXAM: General:  Well appearing. No respiratory difficulty HEENT: normal Neck: supple. no JVD. Carotids 2+ bilat; no bruits. No lymphadenopathy or thyromegaly appreciated. Cor: PMI nondisplaced. Regular rate & rhythm. No rubs, gallops or murmurs. Lungs: clear Abdomen: soft, nontender, nondistended. No hepatosplenomegaly. No bruits or masses. Good bowel sounds. Extremities: no cyanosis, clubbing, rash, edema Neuro: alert & oriented x 3, cranial nerves grossly intact. moves all 4 extremities w/o difficulty. Affect pleasant.   ASSESSMENT & PLAN:  1. Chronic  Combined Heart Failure  - EF as low as 10% back in 2016. She was started on guideline based medications and EF improved to 65-70%on echo in 03/2018.  -ECHO 5/21  EF back down to 20-25% (in the setting of poor med compliance) with global hypokinesis, grade II diastolic dysfunction, severely dilated left atrium, moderate MR, and trivial AI. RV size normal with systolic function mildly reduced. GDMT restarted.  - NYHA II. Volume status trending up.  Weight up 7 pounds from last office visit -Dobut BP will tolerate further titration of Entresto.  We will continue 24-26 twice daily -Start Farxiga 10 mg daily -Add back spironolactone 12.5 mg nightly - Continue Lasix 40 mg daily -Given soft BP and bradycardia with heart rate 55 bpm, will hold off on restarting carvedilol at this time -She is off digoxin given previous rash - Check BMET today and again in 7 days - Discussed importance of daily weights and medication compliance.  -RTC in 2 to 3 weeks with Pharm.D appointment for further med titration - Plan to repeat ECHO in 3 months after HF medications optimized.   2. HTN -Soft but stable -See med recs above -BMP today  3. History of PE - provoked in the setting of prolonged travel - treated w/ Eliquis. No longer on a/c   F/u pharmD in 3-4 weeks. F/u APP in 8 weeks    Alexandria Kropp  PA-C  2:52 PM

## 2019-12-15 ENCOUNTER — Other Ambulatory Visit (HOSPITAL_COMMUNITY): Payer: Self-pay | Admitting: *Deleted

## 2019-12-15 MED ORDER — SPIRONOLACTONE 25 MG PO TABS: 12.5000 mg | ORAL_TABLET | Freq: Every day | ORAL | 2 refills | Status: DC

## 2019-12-20 ENCOUNTER — Telehealth (HOSPITAL_COMMUNITY): Payer: Self-pay | Admitting: Licensed Clinical Social Worker

## 2019-12-20 NOTE — Telephone Encounter (Signed)
CSW called pt to inquire if she needed help with transportation for lab tomorrow since she has required transport assistance in the past- unable to reach- left VM requesting return call  Burna Sis, LCSW Clinical Social Worker [facility] Desk#: [phone] Cell#: [phone]

## 2019-12-21 ENCOUNTER — Telehealth (HOSPITAL_COMMUNITY): Payer: Self-pay | Admitting: Vascular Surgery

## 2019-12-21 ENCOUNTER — Other Ambulatory Visit (HOSPITAL_COMMUNITY): Payer: Self-pay

## 2019-12-21 ENCOUNTER — Encounter (HOSPITAL_COMMUNITY): Payer: Self-pay

## 2019-12-21 NOTE — Telephone Encounter (Signed)
Returned pt call to cancel and resch lab appt 12/21/19 @ 3:45

## 2020-01-08 ENCOUNTER — Other Ambulatory Visit (HOSPITAL_COMMUNITY): Payer: Self-pay | Admitting: Adult Health

## 2020-01-08 ENCOUNTER — Other Ambulatory Visit (HOSPITAL_COMMUNITY): Payer: Self-pay

## 2020-01-08 ENCOUNTER — Ambulatory Visit (HOSPITAL_COMMUNITY)
Admission: RE | Admit: 2020-01-08 | Discharge: 2020-01-08 | Disposition: A | Discharge status: Home / Self Care | Payer: Self-pay | Source: Ambulatory Visit | Attending: Internal Medicine | Admitting: Internal Medicine

## 2020-01-08 ENCOUNTER — Other Ambulatory Visit: Payer: Self-pay

## 2020-01-08 VITALS — BP 122/76 | HR 59 | Wt 165.6 lb

## 2020-01-08 DIAGNOSIS — I5022 Chronic systolic (congestive) heart failure: Secondary | ICD-10-CM | POA: Insufficient documentation

## 2020-01-08 DIAGNOSIS — I110 Hypertensive heart disease with heart failure: Secondary | ICD-10-CM | POA: Insufficient documentation

## 2020-01-08 DIAGNOSIS — Z86711 Personal history of pulmonary embolism: Secondary | ICD-10-CM | POA: Insufficient documentation

## 2020-01-08 LAB — BASIC METABOLIC PANEL
Anion gap: 7 (ref 5–15)
BUN: 13 mg/dL (ref 6–20)
CO2: 29 mmol/L (ref 22–32)
Calcium: 9.6 mg/dL (ref 8.9–10.3)
Chloride: 103 mmol/L (ref 98–111)
Creatinine, Ser: 0.84 mg/dL (ref 0.44–1.00)
GFR, Estimated: >60 mL/min (ref >60)
Glucose, Bld: 87 mg/dL (ref 70–99)
Potassium: 4.1 mmol/L (ref 3.5–5.1)
Sodium: 139 mmol/L (ref 135–145)

## 2020-01-08 LAB — BRAIN NATRIURETIC PEPTIDE: B Natriuretic Peptide: 55.6 pg/mL (ref 0.0–100.0)

## 2020-01-08 MED ORDER — FUROSEMIDE 40 MG PO TABS: 40.0000 mg | ORAL_TABLET | Freq: Every day | ORAL | 11 refills | Status: DC

## 2020-01-08 MED ORDER — SPIRONOLACTONE 25 MG PO TABS: 12.5000 mg | ORAL_TABLET | Freq: Every day | ORAL | 5 refills | Status: DC

## 2020-01-08 MED ORDER — DAPAGLIFLOZIN PROPANEDIOL 10 MG PO TABS: 10.0000 mg | ORAL_TABLET | Freq: Every day | ORAL | 3 refills | Status: DC

## 2020-01-08 MED ORDER — POTASSIUM CHLORIDE CRYS ER 20 MEQ PO TBCR: 20.0000 meq | EXTENDED_RELEASE_TABLET | Freq: Every day | ORAL | 6 refills | Status: DC

## 2020-01-08 MED ORDER — SPIRONOLACTONE 25 MG PO TABS: 12.5000 mg | ORAL_TABLET | Freq: Every day | ORAL | 6 refills | Status: DC

## 2020-01-08 MED FILL — POTASSIUM CHLORIDE CRYS ER: 20 | 30 days supply | Qty: 30 | Fill #0

## 2020-01-08 MED FILL — SPIRONOLACTONE 25 MG TABS: 25 | 30 days supply | Qty: 15 | Fill #0

## 2020-01-08 MED FILL — FARXIGA 10 MG TABLET: 10 | 30 days supply | Qty: 30 | Fill #0

## 2020-01-08 NOTE — Patient Instructions (Addendum)
It was a pleasure seeing you today!  MEDICATIONS: -We are changing your medications today -Start Farxiga 10 mg (1 tablet) daily -Start spironolactone 12.5 mg (1/2 tablet) daily -Take an extra half tablet of furosemide tonight and tomorrow.  -Call if you have questions about your medications.  LABS: -We will call you if your labs need attention.  NEXT APPOINTMENT: Return to clinic in 4 weeks with Pharmacy Clinic.  In general, to take care of your heart failure: -Limit your fluid intake to 2 Liters (half-gallon) per day.   -Limit your salt intake to ideally 2-3 grams (2000-3000 mg) per day. -Weigh yourself daily and record, and bring that "weight diary" to your next appointment.  (Weight gain of 2-3 pounds in 1 day typically means fluid weight.) -The medications for your heart are to help your heart and help you live longer.   -Please contact us before stopping any of your heart medications.  Call the clinic at [phone] with questions or to reschedule future appointments.

## 2020-01-08 NOTE — Progress Notes (Signed)
PCP: Raliegh Ip FNP  Primary Cardiologist: Dr Gala Romney   HPI:  Alexandria Rhodes a 44 y.o.femalewith a historyofnon-ischemic cardiomyopathy with EF as low as 10% in 2016. EF65-70% on Echo in 03/2018, prior PE in 03/2018 no longer on anticoagulation, migraine, and hypertension.  Shewas admitted to a hospital in [place] in 08/2014 after presenting with chest pain and shortness of breath. She was found to have an EF of 10%. She states she underwent heart cath which showed no CAD. She was [facility] Bon[facility] [place]. She was started onguideline medical therapy and was onEntresto 97-103 mg twice daily,carvedilol 6.25 mg twice daily,and Spironolactone 12.5 mg daily.She moved to [place] at the end of 2019.  Admitted to [facility] 03/2018 with chest pain and found to have an acute PE felt to be provoked by recent move from [place]. Echo at that time showed LVEF of 65-70% with normal wall motion, grade 1 diastolic dysfunction, trivial MR, normal biatrial size, normal RV function, and dilated IVC.She was started on Eliquis but was only able to complete 1 month of therapy due to cost. She also could not afford her heart failure medications and has not been on anything in the past year.  Admitted to [facility] on 07/20/19 with increased shortness of breath. She had been out of her medications for a week. Diuresed with IV furosemide and started on HF meds. D/C weight was 156 pounds. Given all medications from HF fund at discharge. Referred to HF social work to help with meds and transportation to and from appts.   She was seen for initial post hospital follow-up back in June and was doing fairly well from a cardiac standpoint.  She did however endorse a rash on her torso and upper extremities which she felt was most likely secondary to digoxin since it was a new medication that had just been started. Digoxin was discontinued.  Due to soft BP, we were  unable to further titrate her medications at that time. She was instructed to follow-up with pharmacy 2 to 3 weeks later for further med titration however she canceled the appointment and did not reschedule.   She returned to clinic with [name], PA, on 12/14/19 for follow-up. She is currently working at [employer]. She reported compliance with most of her meds but she ran out of spironolactone and carvedilol 3 days prior to appointment. HR 55 bpm off ? blocker. BP soft at 105/70. Weight was up 7 lbs since prior office visit. Reported stable NYHA class II symptoms.  Today she returns to HF clinic for pharmacist medication titration. At last visit with APP Clinic, dapagliflozin 10 mg daily was initiated and spironolactone was re-initiated at a dose of 12.5 mg daily. Unfortunately, patient was unable to pick up medications from [facility] due to her work schedule so she did not start either medication. Overall she is feeling good. No dizziness, lightheadedness, or fatigue. No chest pain or palpitations. Breathing is good, no SOB/DOE. Weight at home has been increasing over the last several weeks. Now consistently 160-165 lbs.  Takes furosemide 40 mg daily. She feels like she has extra fluid today, complains of abdominal distention/bloating. Weight is also up 5 lbs from last clinic visit. No PND/Orthopnea/LEE. Appetite is good. Has trouble limiting salt, admits to eating "junk food" like chips. We discussed the importance of a low salt diet.   HF Medications: Entresto 24/26 mg BID  Spironolactone 12.5 mg daily - did  not restart Dapagliflozin 10 mg daily - did not start Furosemide 40 mg PRN  Potassium chloride 20 mg PRN with furosemide  Has the patient been experiencing any side effects to the medications prescribed?  NO  Does the patient have any problems obtaining medications due to transportation or finances?   YES Uninsured, HF fund and [employer] patient assistance. Has trouble  getting to pharmacy during open hours because of her schedule. CSW sets up rides for clinic appts.    Understanding of regimen: Excellent Understanding of indications: Excellent Potential of compliance: Fair -- did not pick up [place] or spironolactone due to work schedule Patient understands to avoid NSAIDs. Patient understands to avoid decongestants.  Pertinent Lab Values 08/22/19:   - Serum creatinine 1.09, BUN 20, Potassium 5.1, Sodium 142,  - BMET/BNP from today pending  Vital Signs: . Weight: 165.6 lbs (last clinic weight: 160.8 lbs) . Blood pressure: 122/76 . Heart rate: 59 bpm  Assessment: 1. Chronic Combined Heart Failure  - EF as low as 10% back in 2016. She was started on guideline based medications and EF improved to 65-70%on echo in 03/2018.  -ECHO in 5/21 EF back down to 20-25% (in the setting of poor med compliance) with global hypokinesis, grade II diastolic dysfunction, severely dilated left atrium, moderate MR, and trivial AI. RV size normal with systolic function mildly reduced. GDMT restarted.  - NYHA II. No LEE or JVD on exam; however, patient complains of abdominal distention/bloating and weight is up 5 lbs from last office visit.   - Take furosemide 40 mg AM/20 mg PM x2 days, then restart furosemide 40 mg daily. BMET, BNP pending.  - Continue Entresto 24/26 mg BID - Start spironolactone 12.5 mg nightly, BMET pending - Start dapagliflozin 10 mg daily - She is off digoxin given previous rash - Discussed importance of daily weights and medication compliance.  - Plan to repeat ECHO  after HF medications optimized.   2. HTN - Controlled at 122/76 today - See med recs above  3. History of PE - Provoked in the setting of prolonged travel - Previously treated with Eliquis. No longer on anticoagulation    Plan: 1) Medication changes: Based on clinical presentation, vital signs and pending BMET, will start spironolactone 12.5 mg daily and dapagliflozin 10 mg  daily as she did not start these as instructed last visit. Will also have her take furosemide 40 mg AM/20 mg PM x2 days, then restart furosemide 40 mg daily. 2) BMET and BNP from today pending 3) Follow-up with APP clinic in 4 weeks   Karle Plumber, PharmD, BCPS, BCCP, CPP Heart Failure Clinic Pharmacist [phone]

## 2020-02-06 MED FILL — SPIRONOLACTONE 25 MG TABS: 25 | 30 days supply | Qty: 15 | Fill #0

## 2020-02-06 MED FILL — FARXIGA 10 MG TABLET: 10 | 30 days supply | Qty: 30 | Fill #1

## 2020-02-07 ENCOUNTER — Telehealth (HOSPITAL_COMMUNITY): Payer: Self-pay | Admitting: Cardiology

## 2020-02-07 NOTE — Telephone Encounter (Signed)
appt reminder call placed No answer LMOM with appt details

## 2020-02-12 ENCOUNTER — Ambulatory Visit (HOSPITAL_COMMUNITY)
Admission: RE | Admit: 2020-02-12 | Discharge: 2020-02-12 | Disposition: A | Discharge status: Home / Self Care | Payer: Self-pay | Source: Ambulatory Visit | Attending: Adult Health | Admitting: Adult Health

## 2020-02-12 ENCOUNTER — Encounter (HOSPITAL_COMMUNITY): Payer: Self-pay

## 2020-02-12 ENCOUNTER — Other Ambulatory Visit: Payer: Self-pay

## 2020-02-12 VITALS — BP 104/70 | HR 79 | Wt 167.4 lb

## 2020-02-12 DIAGNOSIS — Z79899 Other long term (current) drug therapy: Secondary | ICD-10-CM | POA: Insufficient documentation

## 2020-02-12 DIAGNOSIS — I428 Other cardiomyopathies: Secondary | ICD-10-CM | POA: Insufficient documentation

## 2020-02-12 DIAGNOSIS — Z87891 Personal history of nicotine dependence: Secondary | ICD-10-CM | POA: Insufficient documentation

## 2020-02-12 DIAGNOSIS — I5042 Chronic combined systolic (congestive) and diastolic (congestive) heart failure: Secondary | ICD-10-CM | POA: Insufficient documentation

## 2020-02-12 DIAGNOSIS — I10 Essential (primary) hypertension: Secondary | ICD-10-CM

## 2020-02-12 DIAGNOSIS — I110 Hypertensive heart disease with heart failure: Secondary | ICD-10-CM | POA: Insufficient documentation

## 2020-02-12 DIAGNOSIS — I5022 Chronic systolic (congestive) heart failure: Secondary | ICD-10-CM

## 2020-02-12 DIAGNOSIS — Z86711 Personal history of pulmonary embolism: Secondary | ICD-10-CM | POA: Insufficient documentation

## 2020-02-12 MED FILL — POTASSIUM CHLORIDE CRYS ER: 20 | 30 days supply | Qty: 30 | Fill #1

## 2020-02-12 NOTE — Progress Notes (Signed)
PCP: Raliegh Ip FNP  Primary Cardiologist: Dr Gala Romney   Reason for visit: Heart Failure   HPI: Alexandria Rhodes a 44 y.o.femalewith a historyofnon-ischemic cardiomyopathy with EF as low as 10% in 2016but most recent 65-70% on Echo in 03/2018, prior PE in 03/2018 no longer on anticoagulation, migraine, and hypertension.  She was admitted to a hospital in [place] in 08/2014 after presenting with chest pain and shortness of breath.  She was found to have an EF of 10%. She states she underwent heart cath which showed no CAD. [facility] [place].  She was started on guideline medical therapy and was on Entresto 97-103 mg twice daily,  Coreg 6.25 mg twice daily, and Spironolactone 12.5 mg daily.  She moved to [place] at the end of 2019.  Admitted to [facility] in 03/2018 with chest pain and found to have an acute PE felt to be provoked by recent move from [place]. Echo at that time showed LVEF of 65-70% with normal wall motion, grade 1 diastolic dysfunction, trivial MR, normal biatrial size, normal RV function, and dilated IVC.  She was started on Eliquis but was only able to complete 1 month of therapy due to cost.  She also could not afford her heart failure medications and has not been on anything in the past year.  Admitted to [facility] on 07/20/19 with increased shortness of breath. She  Had been out of her medications for a week. Diuresed with IV lasix and started on HF meds. D/C weight 156 pounds. Given all medications from HF fund at discharge. Referred to HF social work to help w/ meds and transportation to and from appts.   She was seen for initial post hospital follow-up back in June and was doing fairly well from a cardiac standpoint.  She did however endorse a rash on her torso and upper extremities which she felt was most likely secondary to digoxin since it was a new medication that had just been started.  Digoxin was  discontinued.  Due to soft BP, we were unable to further titrate her medications at that time.  She was instructed to follow-up with pharmacy 2 to 3 weeks later for further med titration however she canceled the appointment and did not reschedule.   Today she returns for HF follow up.Overall feeling fine. Denies SOB/PND/Orthopnea. Appetite ok. No fever or chills. Weight at home 160 -165 pounds. Taking all medications. Works full time at [employer]. Lives alone. She has trouble paying for medications and transportation.   ECHO 07/2019-EF 20-25% Grade II DD ECHO 03/2018 EF 65-70%    ROS: All systems negative except as listed in HPI, PMH and Problem List.  SH:  Social History   Socioeconomic History  . Marital status: Single    Spouse name: Not on file  . Number of children: Not on file  . Years of education: Not on file  . Highest education level: Not on file  Occupational History  . Not on file  Tobacco Use  . Smoking status: Former Games developer  . Smokeless tobacco: Never Used  Vaping Use  . Vaping Use: Never used  Substance and Sexual Activity  . Alcohol use: Not Currently  . Drug use: Not Currently  . Sexual activity: Yes  Other Topics Concern  . Not on file  Social History Narrative  . Not on file   Social Determinants of Health   Financial Resource Strain:   .  Difficulty of Paying Living Expenses: Not on file  Food Insecurity:   . Worried About Programme researcher, broadcasting/film/video in the Last Year: Not on file  . Ran Out of Food in the Last Year: Not on file  Transportation Needs: No Transportation Needs  . Lack of Transportation (Medical): No  . Lack of Transportation (Non-Medical): No  Physical Activity:   . Days of Exercise per Week: Not on file  . Minutes of Exercise per Session: Not on file  Stress:   . Feeling of Stress : Not on file  Social Connections:   . Frequency of Communication with Friends and Family: Not on file  . Frequency of Social Gatherings with Friends and Family:  Not on file  . Attends Religious Services: Not on file  . Active Member of Clubs or Organizations: Not on file  . Attends Banker Meetings: Not on file  . Marital Status: Not on file  Intimate Partner Violence:   . Fear of Current or Ex-Partner: Not on file  . Emotionally Abused: Not on file  . Physically Abused: Not on file  . Sexually Abused: Not on file    FH:  Family History  Problem Relation Age of Onset  . Hypertension Mother     Past Medical History:  Diagnosis Date  . Acute pulmonary embolism (HCC)   . Cardiomyopathy (HCC)   . CHF (congestive heart failure) (HCC)   . Hypertension   . Non-ischemic cardiomyopathy (HCC) 07/2019    Current Outpatient Medications  Medication Sig Dispense Refill  . acetaminophen (TYLENOL) 500 MG tablet Take 1,000-1,500 mg by mouth 2 (two) times daily as needed for mild pain or headache.    . dapagliflozin propanediol (FARXIGA) 10 MG TABS tablet Take 1 tablet (10 mg total) by mouth daily. 30 tablet 3  . furosemide (LASIX) 40 MG tablet Take 1 tablet (40 mg total) by mouth daily. 30 tablet 11  . potassium chloride SA (KLOR-CON) 20 MEQ tablet Take 1 tablet (20 mEq total) by mouth daily. 30 tablet 6  . sacubitril-valsartan (ENTRESTO) 24-26 MG Take 1 tablet by mouth 2 (two) times daily. 60 tablet 0  . spironolactone (ALDACTONE) 25 MG tablet Take 0.5 tablets (12.5 mg total) by mouth daily. 15 tablet 5  . SUMAtriptan (IMITREX) 50 MG tablet Take 50 mg by mouth every 2 (two) hours as needed for migraine. May repeat in 2 hours if headache persists or recurs.     No current facility-administered medications for this encounter.    Vitals:   02/12/20 1450  BP: 104/70  Pulse: 79  SpO2: 99%  Weight: 75.9 kg (167 lb 6.4 oz)   Wt Readings from Last 3 Encounters:  02/12/20 75.9 kg (167 lb 6.4 oz)  01/08/20 75.1 kg (165 lb 9.6 oz)  12/14/19 72.9 kg (160 lb 12.8 oz)   PHYSICAL EXAM: General:  Well appearing. No respiratory  difficulty HEENT: normal Neck: supple. no JVD. Carotids 2+ bilat; no bruits. No lymphadenopathy or thyromegaly appreciated. Cor: PMI nondisplaced. Regular rate & rhythm. No rubs, gallops or murmurs. Lungs: clear Abdomen: soft, nontender, nondistended. No hepatosplenomegaly. No bruits or masses. Good bowel sounds. Extremities: no cyanosis, clubbing, rash, edema Neuro: alert & oriented x 3, cranial nerves grossly intact. moves all 4 extremities w/o difficulty. Affect pleasant.   ASSESSMENT & PLAN:  1. Chronic Combined Heart Failure  - EF as low as 10% back in 2016. She was started on guideline based medications and EF improved to 65-70%on echo  in 03/2018.  -ECHO 5/21  EF back down to 20-25% (in the setting of poor med compliance) with global hypokinesis, grade II diastolic dysfunction, severely dilated left atrium, moderate MR, and trivial AI. RV size normal with systolic function mildly reduced. GDMT restarted. - NYHA II. Volume status stable.  - Off coreg due to hypotension/bradycardia.   -Continue entresto 24-26 twice daily -Continue  Farxiga 10 mg daily -Continue spironolactone 12.5 mg nightly - Continue Lasix 40 mg daily -She is off digoxin given previous rash - Plan to repeat ECHO next visit.    2. HTN Stable.   3. History of PE - provoked in the setting of prolonged travel - treated w/ Eliquis. No longer on a/c   Follow up 6 weeks with Dr Gala Romney and an ECHO. Medicaid was denied.   Tonye Becket  NP- C 2:54 PM

## 2020-02-12 NOTE — Patient Instructions (Addendum)
For your Entresto refill, please call [employer] at [phone]. If you have any problems or questions, please call our office.  Your physician recommends that you schedule a follow-up appointment in: 6-8 weeks with an echocardiogram  Your physician has requested that you have an echocardiogram. Echocardiography is a painless test that uses sound waves to create images of your heart. It provides your doctor with information about the size and shape of your heart and how well your heart's chambers and valves are working. This procedure takes approximately one hour. There are no restrictions for this procedure.   If you have any questions or concerns before your next appointment please send [place] a message through [place] or call our office at [phone].    TO LEAVE A MESSAGE FOR THE NURSE SELECT OPTION 2, PLEASE LEAVE A MESSAGE INCLUDING: . YOUR NAME . DATE OF BIRTH . CALL BACK NUMBER . REASON FOR CALL**this is important as we prioritize the call backs  YOU WILL RECEIVE A CALL BACK THE SAME DAY AS LONG AS YOU CALL BEFORE 4:00 PM

## 2020-03-18 MED FILL — SPIRONOLACTONE 25 MG TABS: 25 | 30 days supply | Qty: 15 | Fill #1

## 2020-03-18 MED FILL — FARXIGA 10 MG TABLET: 10 | 30 days supply | Qty: 30 | Fill #2

## 2020-04-10 ENCOUNTER — Encounter (HOSPITAL_COMMUNITY): Payer: Self-pay | Admitting: Internal Medicine

## 2020-04-10 ENCOUNTER — Ambulatory Visit (HOSPITAL_BASED_OUTPATIENT_CLINIC_OR_DEPARTMENT_OTHER)
Admission: RE | Admit: 2020-04-10 | Discharge: 2020-04-10 | Disposition: A | Discharge status: Home / Self Care | Payer: Self-pay | Source: Ambulatory Visit | Attending: Internal Medicine | Admitting: Internal Medicine

## 2020-04-10 ENCOUNTER — Ambulatory Visit (HOSPITAL_COMMUNITY)
Admission: RE | Admit: 2020-04-10 | Discharge: 2020-04-10 | Disposition: A | Discharge status: Home / Self Care | Payer: Self-pay | Source: Ambulatory Visit | Attending: Family Medicine | Admitting: Family Medicine

## 2020-04-10 ENCOUNTER — Other Ambulatory Visit: Payer: Self-pay

## 2020-04-10 VITALS — BP 108/70 | HR 57 | Wt 164.4 lb

## 2020-04-10 DIAGNOSIS — Z8249 Family history of ischemic heart disease and other diseases of the circulatory system: Secondary | ICD-10-CM | POA: Insufficient documentation

## 2020-04-10 DIAGNOSIS — Z9114 Patient's other noncompliance with medication regimen: Secondary | ICD-10-CM | POA: Insufficient documentation

## 2020-04-10 DIAGNOSIS — Z86711 Personal history of pulmonary embolism: Secondary | ICD-10-CM | POA: Insufficient documentation

## 2020-04-10 DIAGNOSIS — I5042 Chronic combined systolic (congestive) and diastolic (congestive) heart failure: Secondary | ICD-10-CM | POA: Insufficient documentation

## 2020-04-10 DIAGNOSIS — R42 Dizziness and giddiness: Secondary | ICD-10-CM

## 2020-04-10 DIAGNOSIS — Z87891 Personal history of nicotine dependence: Secondary | ICD-10-CM | POA: Insufficient documentation

## 2020-04-10 DIAGNOSIS — I10 Essential (primary) hypertension: Secondary | ICD-10-CM

## 2020-04-10 DIAGNOSIS — I5022 Chronic systolic (congestive) heart failure: Secondary | ICD-10-CM

## 2020-04-10 DIAGNOSIS — Z79899 Other long term (current) drug therapy: Secondary | ICD-10-CM | POA: Insufficient documentation

## 2020-04-10 DIAGNOSIS — I255 Ischemic cardiomyopathy: Secondary | ICD-10-CM | POA: Insufficient documentation

## 2020-04-10 DIAGNOSIS — I110 Hypertensive heart disease with heart failure: Secondary | ICD-10-CM | POA: Insufficient documentation

## 2020-04-10 LAB — BASIC METABOLIC PANEL
Anion gap: 10 (ref 5–15)
BUN: 9 mg/dL (ref 6–20)
CO2: 22 mmol/L (ref 22–32)
Calcium: 9.3 mg/dL (ref 8.9–10.3)
Chloride: 107 mmol/L (ref 98–111)
Creatinine, Ser: 0.75 mg/dL (ref 0.44–1.00)
GFR, Estimated: >60 mL/min (ref >60)
Glucose, Bld: 80 mg/dL (ref 70–99)
Potassium: 3.8 mmol/L (ref 3.5–5.1)
Sodium: 139 mmol/L (ref 135–145)

## 2020-04-10 LAB — BRAIN NATRIURETIC PEPTIDE: B Natriuretic Peptide: 59.3 pg/mL (ref 0.0–100.0)

## 2020-04-10 LAB — ECHOCARDIOGRAM COMPLETE
Area-P 1/2: 2.67 cm2
S' Lateral: 3.3 cm

## 2020-04-10 MED ORDER — ENTRESTO 49-51 MG PO TABS: 1.0000 | ORAL_TABLET | Freq: Two times a day (BID) | ORAL | 5 refills | Status: DC

## 2020-04-10 NOTE — Addendum Note (Signed)
Encounter addended by: Suezanne Cheshire, RN on: 04/10/2020 2:56 PM  Actions taken: Diagnosis association updated, Order list changed, Charge Capture section accepted, Clinical Note Signed

## 2020-04-10 NOTE — Addendum Note (Signed)
Encounter addended by: Suezanne Cheshire, RN on: 04/10/2020 3:19 PM  Actions taken: Pharmacy for encounter modified, Visit diagnoses modified, Diagnosis association updated, Order list changed

## 2020-04-10 NOTE — Progress Notes (Signed)
PCP: Raliegh Ip FNP  Primary Cardiologist: Dr Gala Romney   Reason for visit: Heart Failure   HPI: Alexandria Rhodes a 45 y.o.femalewith a historyofnon-ischemic cardiomyopathy with EF as low as 10% in 2016but most recent 65-70% on Echo in 03/2018, prior PE in 03/2018 no longer on anticoagulation, migraine, and hypertension.  She was admitted to a hospital in [place] in 08/2014 after presenting with chest pain and shortness of breath.  She was found to have an EF of 10%. She states she underwent heart cath which showed no CAD. She was [facility] [place].  She was started on guideline medical therapy and was on Entresto 97-103 mg twice daily,  Coreg 6.25 mg twice daily, and Spironolactone 12.5 mg daily.  She moved to [place] at the end of 2019.  Admitted to [facility] in 03/2018 with chest pain and found to have an acute PE felt to be provoked by recent move from [place]. Echo at that time showed LVEF of 65-70% with normal wall motion, grade 1 diastolic dysfunction, trivial MR, normal biatrial size, normal RV function, and dilated IVC.  She was started on Eliquis but was only able to complete 1 month of therapy due to cost.  She also could not afford her heart failure medications and has not been on anything in the past year.  Admitted to [facility] on 07/20/19 with increased shortness of breath. She  Had been out of her medications for a week. Diuresed with IV lasix and started on HF meds. D/C weight 156 pounds. Given all medications from HF fund at discharge. Referred to HF social work to help w/ meds and transportation to and from appts.   She was seen for initial post hospital follow-up back in June and was doing fairly well from a cardiac standpoint.  She did however endorse a rash on her torso and upper extremities which she felt was most likely secondary to digoxin since it was a new medication that had just been started.  Digoxin was  discontinued.  Due to soft BP, we were unable to further titrate her medications at that time.  She was instructed to follow-up with pharmacy 2 to 3 weeks later for further med titration however she canceled the appointment and did not reschedule.   Today she returns for HF follow up. Feels good. Working FT at [employer]. No SOB, CP, edema, orthopnea or dizziness. Complaint with all meds. SBP 110-120. Takes lasix every day. When she doesn't take it her weight goes up   Echo today 04/10/20 EF 40-45% Personally reviewed   ECHO 07/2019-EF 20-25% Grade II DD ECHO 03/2018 EF 65-70%    ROS: All systems negative except as listed in HPI, PMH and Problem List.  SH:  Social History   Socioeconomic History  . Marital status: Single    Spouse name: Not on file  . Number of children: Not on file  . Years of education: Not on file  . Highest education level: Not on file  Occupational History  . Not on file  Tobacco Use  . Smoking status: Former Games developer  . Smokeless tobacco: Never Used  Vaping Use  . Vaping Use: Never used  Substance and Sexual Activity  . Alcohol use: Not Currently  . Drug use: Not Currently  . Sexual activity: Yes  Other Topics Concern  . Not on file  Social History Narrative  . Not on file   Social Determinants of  Health   Financial Resource Strain: Not on file  Food Insecurity: Not on file  Transportation Needs: No Transportation Needs  . Lack of Transportation (Medical): No  . Lack of Transportation (Non-Medical): No  Physical Activity: Not on file  Stress: Not on file  Social Connections: Not on file  Intimate Partner Violence: Not on file    FH:  Family History  Problem Relation Age of Onset  . Hypertension Mother     Past Medical History:  Diagnosis Date  . Acute pulmonary embolism (HCC)   . Cardiomyopathy (HCC)   . CHF (congestive heart failure) (HCC)   . Hypertension   . Non-ischemic cardiomyopathy (HCC) 07/2019    Current Outpatient  Medications  Medication Sig Dispense Refill  . acetaminophen (TYLENOL) 500 MG tablet Take 1,000-1,500 mg by mouth 2 (two) times daily as needed for mild pain or headache.    . dapagliflozin propanediol (FARXIGA) 10 MG TABS tablet Take 1 tablet (10 mg total) by mouth daily. 30 tablet 3  . furosemide (LASIX) 40 MG tablet Take 1 tablet (40 mg total) by mouth daily. 30 tablet 11  . potassium chloride SA (KLOR-CON) 20 MEQ tablet Take 1 tablet (20 mEq total) by mouth daily. 30 tablet 6  . sacubitril-valsartan (ENTRESTO) 24-26 MG Take 1 tablet by mouth 2 (two) times daily. 60 tablet 0  . spironolactone (ALDACTONE) 25 MG tablet Take 0.5 tablets (12.5 mg total) by mouth daily. 15 tablet 5  . SUMAtriptan (IMITREX) 50 MG tablet Take 50 mg by mouth every 2 (two) hours as needed for migraine. May repeat in 2 hours if headache persists or recurs.     No current facility-administered medications for this encounter.    Vitals:   04/10/20 1403  BP: 108/70  Pulse: (!) 57  SpO2: 100%  Weight: 74.6 kg (164 lb 6.4 oz)   Wt Readings from Last 3 Encounters:  02/12/20 75.9 kg (167 lb 6.4 oz)  01/08/20 75.1 kg (165 lb 9.6 oz)  12/14/19 72.9 kg (160 lb 12.8 oz)   PHYSICAL EXAM: General:  Well appearing. No resp difficulty HEENT: normal Neck: supple. no JVD. Carotids 2+ bilat; no bruits. No lymphadenopathy or thryomegaly appreciated. Cor: PMI nondisplaced. Huston Foley.Regular No rubs, gallops or murmurs. Lungs: clear Abdomen: soft, nontender, nondistended. No hepatosplenomegaly. No bruits or masses. Good bowel sounds. Extremities: no cyanosis, clubbing, rash, edema Neuro: alert & orientedx3, cranial nerves grossly intact. moves all 4 extremities w/o difficulty. Affect pleasant  ECG: sinus brady 57 Personally reviewed  ASSESSMENT & PLAN:  1. Chronic Combined Heart Failure  - EF as low as 10% back in 2016. She was started on guideline based medications and EF improved to 65-70%on echo in 03/2018.  -Echo 5/21   EF back down to 20-25% (in the setting of poor med compliance) with global hypokinesis, grade II diastolic dysfunction, severely dilated left atrium, moderate MR, and trivial AI. RV size normal with systolic function mildly reduced. GDMT restarted. - Echo today 04/10/20 EF 40-45% Personally reviewed - Improved NYHAI.  Volume status looks good. Continue lasix  - Off coreg due to hypotension/bradycardia.   -Increase entresto 49/51 twice daily -Continue  Farxiga 10 mg daily -Continue spironolactone 12.5 mg nightly - Continue Lasix 40 mg daily - She is off digoxin given previous rash - Labs today  2. HTN - Blood pressure well controlled. Continue current regimen.  3. History of PE - provoked in the setting of prolonged travel - treated w/ Eliquis. No longer on a/c  Arvilla Meres  MD 1:59 PM

## 2020-04-10 NOTE — Patient Instructions (Signed)
Increase Entresto to 49/51 (1 tablet) Twice daily   Labs done today, your results will be available in MyChart, we will contact you for abnormal readings.   Please call our office in June 2022 for an appointment   If you have any questions or concerns before your next appointment please send [place] a message through [place] or call our office at [phone].    TO LEAVE A MESSAGE FOR THE NURSE SELECT OPTION 2, PLEASE LEAVE A MESSAGE INCLUDING: . YOUR NAME . DATE OF BIRTH . CALL BACK NUMBER . REASON FOR CALL**this is important as we prioritize the call backs  YOU WILL RECEIVE A CALL BACK THE SAME DAY AS LONG AS YOU CALL BEFORE 4:00 PM  At the [facility], you and your health needs are our priority. As part of our continuing mission to provide you with exceptional heart care, we have created designated Provider Care Teams. These Care Teams include your primary Cardiologist (physician) and Advanced Practice Providers (APPs- Physician Assistants and Nurse Practitioners) who all work together to provide you with the care you need, when you need it.   You may see any of the following providers on your designated Care Team at your next follow up: Marland Kitchen Dr Arvilla Meres . Dr Marca Ancona . Tonye Becket, NP . [name], PA . Shanda Bumps [place],NP . Karle Plumber, PharmD   Please be sure to bring in all your medications bottles to every appointment.

## 2020-04-10 NOTE — Progress Notes (Signed)
  Echocardiogram 2D Echocardiogram with 3D has been performed.  Alexandria Rhodes M 04/10/2020, 1:56 PM

## 2020-04-12 ENCOUNTER — Encounter (HOSPITAL_COMMUNITY): Payer: Self-pay

## 2020-04-12 NOTE — Progress Notes (Signed)
Records faxed to Disability Determination Services as requested. Form sent to be scanned into chart   Fax [phone]

## 2020-04-24 MED FILL — FARXIGA 10 MG TABLET: 10 | 30 days supply | Qty: 30 | Fill #3

## 2020-04-24 MED FILL — SPIRONOLACTONE 25 MG TABS: 25 | 30 days supply | Qty: 15 | Fill #2

## 2020-04-24 MED FILL — POTASSIUM CHLORIDE CRYS ER: 20 | 30 days supply | Qty: 30 | Fill #2

## 2020-05-20 MED FILL — POTASSIUM CHLORIDE CRYS ER: 20 | 30 days supply | Qty: 30 | Fill #2

## 2020-05-20 MED FILL — FARXIGA 10 MG TABLET: 10 | 30 days supply | Qty: 30 | Fill #3

## 2020-05-20 MED FILL — SPIRONOLACTONE 25 MG TABS: 25 | 30 days supply | Qty: 15 | Fill #2

## 2020-05-20 MED FILL — FUROSEMIDE 40 MG TAB: 40 | 30 days supply | Qty: 30 | Fill #0

## 2020-06-18 ENCOUNTER — Other Ambulatory Visit (HOSPITAL_COMMUNITY): Payer: Self-pay | Admitting: Internal Medicine

## 2020-06-18 ENCOUNTER — Other Ambulatory Visit (HOSPITAL_COMMUNITY): Payer: Self-pay

## 2020-06-18 MED ORDER — DAPAGLIFLOZIN PROPANEDIOL 10 MG PO TABS
10.0000 mg | ORAL_TABLET | Freq: Every day | ORAL | 3 refills | Status: DC
  Filled 2020-06-18 – 2020-06-27 (×2): qty 30, 30d supply, fill #0

## 2020-06-18 MED FILL — Spironolactone Tab 25 MG: ORAL | 30 days supply | Qty: 15 | Fill #0 | Status: CN

## 2020-06-18 MED FILL — Potassium Chloride Microencapsulated Crys ER Tab 20 mEq: ORAL | 30 days supply | Qty: 30 | Fill #0 | Status: CN

## 2020-06-26 ENCOUNTER — Other Ambulatory Visit (HOSPITAL_COMMUNITY): Payer: Self-pay

## 2020-06-27 ENCOUNTER — Other Ambulatory Visit (HOSPITAL_COMMUNITY): Payer: Self-pay

## 2020-06-27 MED FILL — Spironolactone Tab 25 MG: ORAL | 30 days supply | Qty: 15 | Fill #0 | Status: AC

## 2020-06-27 MED FILL — Potassium Chloride Microencapsulated Crys ER Tab 20 mEq: ORAL | 30 days supply | Qty: 30 | Fill #0 | Status: AC

## 2020-08-20 ENCOUNTER — Other Ambulatory Visit (HOSPITAL_COMMUNITY): Payer: Self-pay

## 2020-08-20 ENCOUNTER — Telehealth (HOSPITAL_COMMUNITY): Payer: Self-pay | Admitting: Pharmacy Technician

## 2020-08-20 NOTE — Telephone Encounter (Signed)
Advanced Heart Failure Patient Advocate Encounter  It's time to re-enroll patient to receive medication assistance for Entresto from [employer]. Left voicemail for patient to call me back so that we can start the re-enrollment process.  Craig Staggers (CPhT) [facility] Patient Advocate  08/20/2020 3:09 PM

## 2020-09-06 NOTE — Progress Notes (Signed)
ADVANCED HEART FAILURE CLINIC NOTE  PCP: Raliegh Ip FNP  HF Cardiologist: Dr Gala Romney   Reason for visit: Heart Failure   HPI: Alexandria Rhodes is a 45 y.o. female with a history of non-ischemic cardiomyopathy with EF as low as 10% in 2016 but most recent  65-70% on Echo in 03/2018, prior PE in 03/2018 no longer on anticoagulation, migraine, and hypertension.  She was admitted to a hospital in [place] in 08/2014 after presenting with CP and SOB.  She was found to have an EF of 10%. She states she underwent heart cath which showed no CAD. She was followed by [facility] [place].  She was started on GDMT and was on Entresto 97-103 mg bid,  Coreg 6.25 mg bid and Spironolactone 12.5 mg daily.  She moved to [place] at the end of 2019.  Admitted to [facility] in 03/2018 with CP and found to have an acute PE felt to be provoked by recent move from [place]. Echo at that time showed LVEF of 65-70% with normal wall motion, grade 1 diastolic dysfunction, trivial MR, normal biatrial size, normal RV function, and dilated IVC.  She was started on Eliquis but was only able to complete 1 month of therapy due to cost.  She also could not afford her HF medications and has not been on anything in the past year.  Admitted to [facility] on 07/20/19 with increased SOB. She  Had been out of her medications for a week. Diuresed with IV lasix and started on HF meds. D/C weight 156 pounds. Given all medications from HF fund at discharge. Referred to HF social work to help w/ meds and transportation to and from appts.   She was seen for post hospital follow-up back in June 2021 and was doing fairly well.  She did have a rash on her torso and upper extremities which she felt was from digoxin.  Digoxin was discontinued.  Due to soft BP, we were unable to further titrate her medications. She was instructed to follow-up with pharmacy 2 to 3 weeks later for further med titration however she  canceled the appointment and did not reschedule.   Seen in clinic 1/22 with echo, with stable NYHA I symptoms and volume. Entresto increased.  Today she returns for HF follow up. Overall feeling fine, feels bad when she forgets to take her medicine. Has been off of all her HF medications for the past month. Denies increasing SOB, CP, dizziness, edema, or PND/Orthopnea. Appetite ok. No fever or chills. Does not check weight at home but has scale.  Working FT at [employer].   Echo 04/10/20: EF 40-45% Personally reviewed by Dr. Gala Romney Echo 07/2019: EF 20-25% Grade II DD Echo 03/2018: EF 65-70%   ROS: All systems negative except as listed in HPI, PMH and Problem List.  SH:  Social History   Socioeconomic History   Marital status: Single    Spouse name: Not on file   Number of children: Not on file   Years of education: Not on file   Highest education level: Not on file  Occupational History   Not on file  Tobacco Use   Smoking status: Former    Pack years: 0.00   Smokeless tobacco: Never  Vaping Use   Vaping Use: Never used  Substance and Sexual Activity   Alcohol use: Not Currently   Drug use: Not Currently   Sexual activity: Yes  Other Topics Concern  Not on file  Social History Narrative   Not on file   Social Determinants of Health   Financial Resource Strain: Not on file  Food Insecurity: Not on file  Transportation Needs: Not on file  Physical Activity: Not on file  Stress: Not on file  Social Connections: Not on file  Intimate Partner Violence: Not on file   FH:  Family History  Problem Relation Age of Onset   Hypertension Mother    Past Medical History:  Diagnosis Date   Acute pulmonary embolism (HCC)    Cardiomyopathy (HCC)    CHF (congestive heart failure) (HCC)    Hypertension    Non-ischemic cardiomyopathy (HCC) 07/2019   Current Outpatient Medications  Medication Sig Dispense Refill   acetaminophen (TYLENOL) 500 MG tablet Take 1,000-1,500 mg by  mouth 2 (two) times daily as needed for mild pain or headache.     SUMAtriptan (IMITREX) 50 MG tablet Take 50 mg by mouth every 2 (two) hours as needed for migraine. May repeat in 2 hours if headache persists or recurs.     dapagliflozin propanediol (FARXIGA) 10 MG TABS tablet TAKE 1 TABLET (10 MG TOTAL) BY MOUTH DAILY. 90 tablet 1   furosemide (LASIX) 40 MG tablet TAKE 1 TABLET (40 MG TOTAL) BY MOUTH DAILY. 90 tablet 1   potassium chloride SA (KLOR-CON) 20 MEQ tablet TAKE 1 TABLET (20 MEQ TOTAL) BY MOUTH DAILY. 90 tablet 1   sacubitril-valsartan (ENTRESTO) 49-51 MG Take 1 tablet by mouth 2 (two) times daily. 180 tablet 1   spironolactone (ALDACTONE) 25 MG tablet TAKE 1/2 TABLET BY MOUTH ONCE DAILY. 45 tablet 1   No current facility-administered medications for this encounter.   BP 140/80   Pulse 60   Wt 79.4 kg (175 lb)   LMP 03/27/2014 (Approximate)   SpO2 100%   BMI 33.07 kg/m   Wt Readings from Last 3 Encounters:  09/09/20 79.4 kg (175 lb)  04/10/20 74.6 kg (164 lb 6.4 oz)  02/12/20 75.9 kg (167 lb 6.4 oz)   PHYSICAL EXAM: General:  NAD. No resp difficulty HEENT: Normal Neck: Supple. No JVD. Carotids 2+ bilat; no bruits. No lymphadenopathy or thryomegaly appreciated. Cor: PMI nondisplaced. Regular rate & rhythm. No rubs, gallops, II/VI +MR Lungs: Clear Abdomen: Soft, nontender, nondistended. No hepatosplenomegaly. No bruits or masses. Good bowel sounds. Extremities: No cyanosis, clubbing, rash, edema; thickened and discolored skin to bilateral hands. Neuro: alert & oriented x 3, cranial nerves grossly intact. Moves all 4 extremities w/o difficulty. Affect pleasant.  ECG: SB 1st degree HB, PR 230 ms, 57 bpm (personally reviewed).  Reds: 32%  ASSESSMENT & PLAN:  1. Chronic Combined Heart Failure  - EF as low as 10% back in 2016.  She was started on guideline based medications and EF improved to 65-70% on echo in 03/2018.   - Echo 5/21 EF back down to 20-25% (in the setting  of poor med compliance) with global hypokinesis, grade II diastolic dysfunction, severely dilated left atrium, moderate MR, and trivial AI. RV size normal with systolic function mildly reduced. GDMT restarted. - Echo 04/10/20 EF 40-45%  - NYHA I-II.  Volume status ok, weight is up - Continue Entresto 97/103 mg bid. - Continue Farxiga 10 mg daily. - Continue spironolactone 12.5 mg at night. - Continue lasix 40 mg daily. - Off coreg due to hypotension/bradycardia.   - She is off digoxin given previous rash. - BMET today.  2. HTN - Blood pressure elevated, has been off  meds for 1 month. - Continue current regimen.  3. History of PE - Provoked in the setting of prolonged travel. - Treated w/ Eliquis. No longer on a/c.  4. Non-compliance - Patient does not drive and transportation is an issue to pick up her meds - Will write for 90-day supply and have meds sent to [facility]. Personally called pharmacy and enrolled her in Rx delivery. - Stressed importance of adherence to medication regimen and daily weights.  Follow back in 2 weeks with APP, consider increasing Entresto. Then, 4-5 months with Dr. Gala Romney.  Prince Rome, FNP-BC 09/09/20

## 2020-09-09 ENCOUNTER — Other Ambulatory Visit: Payer: Self-pay

## 2020-09-09 ENCOUNTER — Encounter (HOSPITAL_COMMUNITY): Payer: Self-pay

## 2020-09-09 ENCOUNTER — Ambulatory Visit (HOSPITAL_COMMUNITY)
Admission: RE | Admit: 2020-09-09 | Discharge: 2020-09-09 | Disposition: A | Discharge status: Home / Self Care | Payer: Self-pay | Source: Ambulatory Visit | Attending: Family Medicine | Admitting: Family Medicine

## 2020-09-09 ENCOUNTER — Other Ambulatory Visit (HOSPITAL_COMMUNITY): Payer: Self-pay

## 2020-09-09 VITALS — BP 140/80 | HR 60 | Wt 175.0 lb

## 2020-09-09 DIAGNOSIS — I428 Other cardiomyopathies: Secondary | ICD-10-CM | POA: Insufficient documentation

## 2020-09-09 DIAGNOSIS — Z86711 Personal history of pulmonary embolism: Secondary | ICD-10-CM | POA: Insufficient documentation

## 2020-09-09 DIAGNOSIS — I5022 Chronic systolic (congestive) heart failure: Secondary | ICD-10-CM

## 2020-09-09 DIAGNOSIS — Z7901 Long term (current) use of anticoagulants: Secondary | ICD-10-CM | POA: Insufficient documentation

## 2020-09-09 DIAGNOSIS — I10 Essential (primary) hypertension: Secondary | ICD-10-CM

## 2020-09-09 DIAGNOSIS — I110 Hypertensive heart disease with heart failure: Secondary | ICD-10-CM | POA: Insufficient documentation

## 2020-09-09 DIAGNOSIS — Z8249 Family history of ischemic heart disease and other diseases of the circulatory system: Secondary | ICD-10-CM | POA: Insufficient documentation

## 2020-09-09 DIAGNOSIS — Z9114 Patient's other noncompliance with medication regimen: Secondary | ICD-10-CM | POA: Insufficient documentation

## 2020-09-09 DIAGNOSIS — Z7984 Long term (current) use of oral hypoglycemic drugs: Secondary | ICD-10-CM | POA: Insufficient documentation

## 2020-09-09 DIAGNOSIS — Z79899 Other long term (current) drug therapy: Secondary | ICD-10-CM | POA: Insufficient documentation

## 2020-09-09 DIAGNOSIS — Z9119 Patient's noncompliance with other medical treatment and regimen: Secondary | ICD-10-CM | POA: Insufficient documentation

## 2020-09-09 DIAGNOSIS — I5042 Chronic combined systolic (congestive) and diastolic (congestive) heart failure: Secondary | ICD-10-CM | POA: Insufficient documentation

## 2020-09-09 DIAGNOSIS — Z87891 Personal history of nicotine dependence: Secondary | ICD-10-CM | POA: Insufficient documentation

## 2020-09-09 DIAGNOSIS — Z09 Encounter for follow-up examination after completed treatment for conditions other than malignant neoplasm: Secondary | ICD-10-CM | POA: Insufficient documentation

## 2020-09-09 LAB — BASIC METABOLIC PANEL
Anion gap: 8 (ref 5–15)
BUN: 16 mg/dL (ref 6–20)
CO2: 24 mmol/L (ref 22–32)
Calcium: 9.2 mg/dL (ref 8.9–10.3)
Chloride: 106 mmol/L (ref 98–111)
Creatinine, Ser: 0.7 mg/dL (ref 0.44–1.00)
GFR, Estimated: >60 mL/min (ref >60)
Glucose, Bld: 96 mg/dL (ref 70–99)
Potassium: 3.6 mmol/L (ref 3.5–5.1)
Sodium: 138 mmol/L (ref 135–145)

## 2020-09-09 MED ORDER — POTASSIUM CHLORIDE CRYS ER 20 MEQ PO TBCR: 20.0000 meq | EXTENDED_RELEASE_TABLET | Freq: Every day | ORAL | 1 refills | Status: DC

## 2020-09-09 MED ORDER — SPIRONOLACTONE 25 MG PO TABS: 12.5000 mg | ORAL_TABLET | Freq: Every day | ORAL | 1 refills | Status: DC

## 2020-09-09 MED ORDER — DAPAGLIFLOZIN PROPANEDIOL 10 MG PO TABS: 10.0000 mg | ORAL_TABLET | Freq: Every day | ORAL | 1 refills | Status: DC

## 2020-09-09 MED ORDER — FUROSEMIDE 40 MG PO TABS
ORAL_TABLET | Freq: Every day | ORAL | 1 refills | Status: DC
Start: 2020-09-09 — End: 2021-01-21

## 2020-09-09 MED ORDER — ENTRESTO 49-51 MG PO TABS: 1.0000 | ORAL_TABLET | Freq: Two times a day (BID) | ORAL | 1 refills | Status: DC

## 2020-09-09 NOTE — Progress Notes (Signed)
ReDS Vest / Clip - 09/09/20 0900       ReDS Vest / Clip   Station Marker A    Ruler Value 27    ReDS Value Range Low volume    ReDS Actual Value 32

## 2020-09-09 NOTE — Patient Instructions (Signed)
Labs today We will only contact you if something comes back abnormal or we need to make some changes. Otherwise no news is good news!   Your physician recommends that you schedule a follow-up appointment in: 2 weeks  in the Advanced Practitioners (PA/NP) Clinic   Do the following things EVERYDAY: Weigh yourself in the morning before breakfast. Write it down and keep it in a log. Take your medicines as prescribed Eat low salt foods--Limit salt (sodium) to 2000 mg per day.  Stay as active as you can everyday Limit all fluids for the day to less than 2 liters  At the Advanced [facility], you and your health needs are our priority. As part of our continuing mission to provide you with exceptional heart care, we have created designated Provider Care Teams. These Care Teams include your primary Cardiologist (physician) and Advanced Practice Providers (APPs- Physician Assistants and Nurse Practitioners) who all work together to provide you with the care you need, when you need it.   You may see any of the following providers on your designated Care Team at your next follow up: Dr Arvilla Meres Dr Marca Ancona Dr Brandon Melnick, NP [name], [place] Mikki Santee Karle Plumber, PharmD   Please be sure to bring in all your medications bottles to every appointment.   If you have any questions or concerns before your next appointment please send [place] a message through [place] or call our office at [phone].    TO LEAVE A MESSAGE FOR THE NURSE SELECT OPTION 2, PLEASE LEAVE A MESSAGE INCLUDING: YOUR NAME DATE OF BIRTH CALL BACK NUMBER REASON FOR CALL**this is important as we prioritize the call backs  YOU WILL RECEIVE A CALL BACK THE SAME DAY AS LONG AS YOU CALL BEFORE 4:00 PM

## 2020-09-10 ENCOUNTER — Telehealth (HOSPITAL_COMMUNITY): Payer: Self-pay | Admitting: Pharmacy Technician

## 2020-09-10 NOTE — Telephone Encounter (Signed)
Advanced Heart Failure Patient Advocate Encounter  Patient was seen in clinic recently and told clinic staff that she currently has insurance. They went ahead and sent all RXs to [facility]. No assistance needed at this time. Will be here to assist in the future as needed.   Archer Asa, CPhT

## 2020-10-04 ENCOUNTER — Encounter (HOSPITAL_COMMUNITY): Payer: Self-pay

## 2020-10-08 ENCOUNTER — Encounter (HOSPITAL_COMMUNITY): Payer: Self-pay

## 2021-01-21 ENCOUNTER — Emergency Department (HOSPITAL_COMMUNITY): Payer: Self-pay

## 2021-01-21 ENCOUNTER — Emergency Department (HOSPITAL_COMMUNITY)
Admission: EM | Admit: 2021-01-21 | Discharge: 2021-01-21 | Disposition: A | Discharge status: Home / Self Care | Payer: Self-pay | Attending: Emergency Medicine | Admitting: Emergency Medicine

## 2021-01-21 DIAGNOSIS — R059 Cough, unspecified: Secondary | ICD-10-CM | POA: Insufficient documentation

## 2021-01-21 DIAGNOSIS — M791 Myalgia, unspecified site: Secondary | ICD-10-CM | POA: Insufficient documentation

## 2021-01-21 DIAGNOSIS — R519 Headache, unspecified: Secondary | ICD-10-CM | POA: Insufficient documentation

## 2021-01-21 DIAGNOSIS — Z20822 Contact with and (suspected) exposure to covid-19: Secondary | ICD-10-CM | POA: Insufficient documentation

## 2021-01-21 DIAGNOSIS — R6889 Other general symptoms and signs: Secondary | ICD-10-CM

## 2021-01-21 DIAGNOSIS — I5022 Chronic systolic (congestive) heart failure: Secondary | ICD-10-CM | POA: Insufficient documentation

## 2021-01-21 DIAGNOSIS — R509 Fever, unspecified: Secondary | ICD-10-CM | POA: Insufficient documentation

## 2021-01-21 DIAGNOSIS — R079 Chest pain, unspecified: Secondary | ICD-10-CM

## 2021-01-21 DIAGNOSIS — R072 Precordial pain: Secondary | ICD-10-CM | POA: Insufficient documentation

## 2021-01-21 DIAGNOSIS — J45909 Unspecified asthma, uncomplicated: Secondary | ICD-10-CM | POA: Insufficient documentation

## 2021-01-21 DIAGNOSIS — I509 Heart failure, unspecified: Secondary | ICD-10-CM

## 2021-01-21 DIAGNOSIS — I110 Hypertensive heart disease with heart failure: Secondary | ICD-10-CM | POA: Insufficient documentation

## 2021-01-21 DIAGNOSIS — R0981 Nasal congestion: Secondary | ICD-10-CM | POA: Insufficient documentation

## 2021-01-21 DIAGNOSIS — Z79899 Other long term (current) drug therapy: Secondary | ICD-10-CM | POA: Insufficient documentation

## 2021-01-21 DIAGNOSIS — Z87891 Personal history of nicotine dependence: Secondary | ICD-10-CM | POA: Insufficient documentation

## 2021-01-21 LAB — CBC WITH DIFFERENTIAL/PLATELET
Abs Immature Granulocytes: 0.01 10*3/uL (ref 0.00–0.07)
Basophils Absolute: 0 10*3/uL (ref 0.0–0.1)
Basophils Relative: 0 %
Eosinophils Absolute: 0.2 10*3/uL (ref 0.0–0.5)
Eosinophils Relative: 2 %
HCT: 38.2 % (ref 36.0–46.0)
Hemoglobin: 11.8 g/dL — ABNORMAL LOW (ref 12.0–15.0)
Immature Granulocytes: 0 %
Lymphocytes Relative: 17 %
Lymphs Abs: 1.4 10*3/uL (ref 0.7–4.0)
MCH: 27.3 pg (ref 26.0–34.0)
MCHC: 30.9 g/dL (ref 30.0–36.0)
MCV: 88.2 fL (ref 80.0–100.0)
Monocytes Absolute: 0.7 10*3/uL (ref 0.1–1.0)
Monocytes Relative: 8 %
Neutro Abs: 6.3 10*3/uL (ref 1.7–7.7)
Neutrophils Relative %: 73 %
Platelets: 254 10*3/uL (ref 150–400)
RBC: 4.33 MIL/uL (ref 3.87–5.11)
RDW: 14.6 % (ref 11.5–15.5)
WBC: 8.6 10*3/uL (ref 4.0–10.5)
nRBC: 0 % (ref 0.0–0.2)

## 2021-01-21 LAB — RESP PANEL BY RT-PCR (FLU A&B, COVID) ARPGX2
Influenza A by PCR: NEGATIVE
Influenza B by PCR: NEGATIVE
SARS Coronavirus 2 by RT PCR: NEGATIVE

## 2021-01-21 LAB — BASIC METABOLIC PANEL
Anion gap: 8 (ref 5–15)
BUN: 8 mg/dL (ref 6–20)
CO2: 25 mmol/L (ref 22–32)
Calcium: 9.4 mg/dL (ref 8.9–10.3)
Chloride: 106 mmol/L (ref 98–111)
Creatinine, Ser: 0.87 mg/dL (ref 0.44–1.00)
GFR, Estimated: >60 mL/min (ref >60)
Glucose, Bld: 103 mg/dL — ABNORMAL HIGH (ref 70–99)
Potassium: 3.9 mmol/L (ref 3.5–5.1)
Sodium: 139 mmol/L (ref 135–145)

## 2021-01-21 LAB — HEPATIC FUNCTION PANEL
ALT: 10 U/L (ref 0–44)
AST: 16 U/L (ref 15–41)
Albumin: 3.6 g/dL (ref 3.5–5.0)
Alkaline Phosphatase: 74 U/L (ref 38–126)
Bilirubin, Direct: 0.2 mg/dL (ref 0.0–0.2)
Indirect Bilirubin: 1.3 mg/dL — ABNORMAL HIGH (ref 0.3–0.9)
Total Bilirubin: 1.5 mg/dL — ABNORMAL HIGH (ref 0.3–1.2)
Total Protein: 8.1 g/dL (ref 6.5–8.1)

## 2021-01-21 LAB — BRAIN NATRIURETIC PEPTIDE: B Natriuretic Peptide: 593 pg/mL — ABNORMAL HIGH (ref 0.0–100.0)

## 2021-01-21 LAB — LIPASE, BLOOD: Lipase: 27 U/L (ref 11–51)

## 2021-01-21 LAB — D-DIMER, QUANTITATIVE: D-Dimer, Quant: 0.55 ug/mL-FEU — ABNORMAL HIGH (ref 0.00–0.50)

## 2021-01-21 LAB — TROPONIN I (HIGH SENSITIVITY): Troponin I (High Sensitivity): 15 ng/L (ref <18)

## 2021-01-21 MED ORDER — FUROSEMIDE 40 MG PO TABS
40.0000 mg | ORAL_TABLET | Freq: Every day | ORAL | 0 refills | Status: DC
  Filled 2021-01-21: qty 30, 30d supply, fill #0

## 2021-01-21 MED ORDER — ALBUTEROL SULFATE HFA 108 (90 BASE) MCG/ACT IN AERS
2.0000 | INHALATION_SPRAY | Freq: Once | RESPIRATORY_TRACT | Status: AC
  Administered 2021-01-21: 2 via RESPIRATORY_TRACT
  Filled 2021-01-21: qty 6.7

## 2021-01-21 MED ORDER — AEROCHAMBER PLUS FLO-VU LARGE MISC: 1.0000 | Freq: Once | Status: DC

## 2021-01-21 MED ORDER — IOHEXOL 350 MG/ML SOLN
75.0000 mL | Freq: Once | INTRAVENOUS | Status: AC | PRN
  Administered 2021-01-21: 75 mL via INTRAVENOUS

## 2021-01-21 MED ORDER — AMOXICILLIN 500 MG PO CAPS
1000.0000 mg | ORAL_CAPSULE | Freq: Three times a day (TID) | ORAL | 0 refills | Status: AC
  Filled 2021-01-21: qty 30, 5d supply, fill #0

## 2021-01-21 MED ORDER — ACETAMINOPHEN 325 MG PO TABS
650.0000 mg | ORAL_TABLET | Freq: Once | ORAL | Status: AC
  Administered 2021-01-21: 650 mg via ORAL
  Filled 2021-01-21: qty 2

## 2021-01-21 MED ORDER — AZITHROMYCIN 250 MG PO TABS
250.0000 mg | ORAL_TABLET | Freq: Every day | ORAL | 0 refills | Status: DC
  Filled 2021-01-21: qty 6, 5d supply, fill #0

## 2021-01-21 NOTE — Discharge Instructions (Addendum)
Please read and follow all provided instructions.  Your diagnoses today include:  1. Flu-like symptoms   2. Chest pain, unspecified type   3. Acute on chronic congestive heart failure, unspecified heart failure type (HCC)     Tests performed today include: Complete blood cell count: No problems Basic metabolic panel: No problems Cardiac enzymes (blood test looking for stress on the heart): no sign of heart attack EKG: No changes from previous EKG Blood test for heart failure: Is elevated today compared to previous Chest x-ray: No signs of pneumonia or infection CT scan of your chest: Shows signs of some heart failure or possibly pneumonia, no blood clots Vital signs. See below for your results today.   Medications prescribed:  Lasix - Medication for heart failure  Amoxicillin - antibiotic  You have been prescribed an antibiotic medicine: take the entire course of medicine even if you are feeling better. Stopping early can cause the antibiotic not to work.  Azithromycin - antibiotic for respiratory infection  You have been prescribed an antibiotic medicine: take the entire course of medicine even if you are feeling better. Stopping early can cause the antibiotic not to work.  Take any prescribed medications only as directed.  Home care instructions:  Follow any educational materials contained in this packet.  BE VERY CAREFUL not to take multiple medicines containing Tylenol (also called acetaminophen). Doing so can lead to an overdose which can damage your liver and cause liver failure and possibly death.   Follow-up instructions: Please follow-up with your primary care provider and cardiologist in the next 3 days for further evaluation of your symptoms and to make sure you are improving.  Return instructions:  Please return to the Emergency Department if you experience worsening symptoms.  Please return if you have any other emergent concerns.  Additional  Information:  Your vital signs today were: BP (!) 145/107   Pulse 69   Temp 98.7 F (37.1 C) (Oral)   Resp 16   LMP 03/27/2014 (Approximate)   SpO2 99%  If your blood pressure (BP) was elevated above 135/85 this visit, please have this repeated by your doctor within one month. --------------

## 2021-01-21 NOTE — ED Provider Notes (Signed)
[place] EMERGENCY DEPARTMENT Provider Note   CSN: [id number] Arrival date & time: 01/21/21  1132     History Chief Complaint  Patient presents with   Nasal Congestion   Generalized Body Aches    Alexandria Rhodes is a 45 y.o. female with a past medical history of spontaneous PE, asthma, cardiomegaly, CHF, hypertension, who presents today for evaluation of 2 complaints.  She reports that for about 3 weeks now she has had substernal chest pain.  This does not radiate or move.  It is worse with coughing.  She denies vomiting or diaphoresis.  She also reports that since Sunday she has had body aches, headaches, subjective fevers, chills, nasal congestion, and cough.  She did not get a flu shot this year.  She denies any known sick contacts.  No leg swelling.  No rashes.   HPI     Past Medical History:  Diagnosis Date   Acute pulmonary embolism (HCC)    Cardiomyopathy ([place])    CHF (congestive heart failure) ([place])    Hypertension    Non-ischemic cardiomyopathy ([place]) 07/2019    Patient Active Problem List   Diagnosis Date Noted   Acute exacerbation of CHF (congestive heart failure) ([place]) 07/20/2019   History of pulmonary embolism 07/20/2019   Hypokalemia 07/20/2019   Normocytic anemia 07/20/2019   Urinary tract infection with hematuria 04/06/2018   Cough 04/06/2018   Acute pulmonary embolism ([place]) 0000000   Chronic systolic CHF (congestive heart failure) ([place]) 03/27/2018   HTN (hypertension) 03/27/2018    No past surgical history on file.   OB History     Gravida      Para      Term      Preterm      AB      Living  0      SAB      IAB      Ectopic      Multiple      Live Births              Family History  Problem Relation Age of Onset   Hypertension Mother     Social History   Tobacco Use   Smoking status: Former   Smokeless tobacco: Never  Scientific laboratory technician Use: Never used  Substance Use Topics   Alcohol  use: Not Currently   Drug use: Not Currently    Home Medications Prior to Admission medications   Medication Sig Start Date End Date Taking? Authorizing Provider  acetaminophen (TYLENOL) 500 MG tablet Take 1,000-1,500 mg by mouth 2 (two) times daily as needed for mild pain or headache.    [provider]  dapagliflozin propanediol (FARXIGA) 10 MG TABS tablet TAKE 1 TABLET (10 MG TOTAL) BY MOUTH DAILY. 09/09/20   [place], Maricela Bo, FNP  furosemide (LASIX) 40 MG tablet TAKE 1 TABLET (40 MG TOTAL) BY MOUTH DAILY. 09/09/20 09/09/21  Rafael Bihari, FNP  potassium chloride SA (KLOR-CON) 20 MEQ tablet TAKE 1 TABLET (20 MEQ TOTAL) BY MOUTH DAILY. 09/09/20   [place], Maricela Bo, FNP  sacubitril-valsartan (ENTRESTO) 49-51 MG Take 1 tablet by mouth 2 (two) times daily. 09/09/20   Rafael Bihari, FNP  spironolactone (ALDACTONE) 25 MG tablet TAKE 1/2 TABLET BY MOUTH ONCE DAILY. 09/09/20   [place], Maricela Bo, FNP  SUMAtriptan (IMITREX) 50 MG tablet Take 50 mg by mouth every 2 (two) hours as needed for migraine. May repeat in 2 hours if headache  persists or recurs.    [provider]    Allergies    Shellfish allergy  Review of Systems   Review of Systems  Constitutional:  Positive for chills and fever.  HENT:  Positive for congestion. Negative for ear pain, sinus pressure, sinus pain, sore throat, trouble swallowing and voice change.   Respiratory:  Positive for cough and wheezing. Negative for chest tightness and shortness of breath.   Cardiovascular:  Positive for chest pain. Negative for palpitations.  Gastrointestinal:  Negative for abdominal pain, diarrhea, nausea and vomiting.  Musculoskeletal:  Positive for myalgias. Negative for neck pain and neck stiffness.  Skin:  Negative for rash.  Neurological:  Positive for headaches. Negative for weakness.   Physical Exam Updated Vital Signs BP (!) 134/105 (BP Location: Right Arm)   Pulse 88   Temp 98.7 F (37.1 C) (Oral)    Resp 17   LMP 03/27/2014 (Approximate)   SpO2 99%   Physical Exam Vitals and nursing note reviewed.  Constitutional:      General: She is not in acute distress.    Appearance: She is not ill-appearing.  HENT:     Head: Normocephalic and atraumatic.  Eyes:     Conjunctiva/sclera: Conjunctivae normal.  Cardiovascular:     Rate and Rhythm: Normal rate.  Pulmonary:     Effort: Pulmonary effort is normal. No respiratory distress.     Comments: Patient has diffuse wheezing bilaterally through all lung fields. Abdominal:     General: There is no distension.  Musculoskeletal:     Cervical back: Normal range of motion and neck supple.     Comments: No obvious acute injury  Skin:    General: Skin is warm and dry.  Neurological:     Mental Status: She is alert.     Comments: Awake and alert, answers all questions appropriately.  Speech is not slurred.  Psychiatric:        Mood and Affect: Mood normal.        Behavior: Behavior normal.    ED Results / Procedures / Treatments   Labs (all labs ordered are listed, but only abnormal results are displayed) Labs Reviewed  CBC WITH DIFFERENTIAL/PLATELET - Abnormal; Notable for the following components:      Result Value   Hemoglobin 11.8 (*)    All other components within normal limits  BASIC METABOLIC PANEL - Abnormal; Notable for the following components:   Glucose, Bld 103 (*)    All other components within normal limits  BRAIN NATRIURETIC PEPTIDE - Abnormal; Notable for the following components:   B Natriuretic Peptide 593.0 (*)    All other components within normal limits  HEPATIC FUNCTION PANEL - Abnormal; Notable for the following components:   Total Bilirubin 1.5 (*)    Indirect Bilirubin 1.3 (*)    All other components within normal limits  D-DIMER, QUANTITATIVE - Abnormal; Notable for the following components:   D-Dimer, Quant 0.55 (*)    All other components within normal limits  RESP PANEL BY RT-PCR (FLU A&B, COVID)  ARPGX2  LIPASE, BLOOD  TROPONIN I (HIGH SENSITIVITY)    EKG EKG Interpretation  Date/Time:  Tuesday January 21 2021 11:41:58 EST Ventricular Rate:  88 PR Interval:  178 QRS Duration: 98 QT Interval:  388 QTC Calculation: 469 R Axis:   32 Text Interpretation: Normal sinus rhythm ST & T wave abnormality, consider lateral ischemia Prolonged QT Abnormal ECG No significant change since last tracing Confirmed by Jacalyn Lefevre (  [id number]) on 01/21/2021 2:00:39 PM  Radiology DG Chest 2 View  Result Date: 01/21/2021 CLINICAL DATA:  Shortness of breath, cough. EXAM: CHEST - 2 VIEW COMPARISON:  Jul 20, 2019. FINDINGS: Stable cardiomegaly. Both lungs are clear. The visualized skeletal structures are unremarkable. IMPRESSION: No active cardiopulmonary disease. Electronically Signed   By: Marijo Conception M.D.   On: 01/21/2021 12:44    Procedures Procedures   Medications Ordered in ED Medications  albuterol (VENTOLIN HFA) 108 (90 Base) MCG/ACT inhaler 2 puff (has no administration in time range)  AeroChamber Plus Flo-Vu Large MISC 1 each (has no administration in time range)  acetaminophen (TYLENOL) tablet 650 mg (has no administration in time range)    ED Course  I have reviewed the triage vital signs and the nursing notes.  Pertinent labs & imaging results that were available during my care of the patient were reviewed by me and considered in my medical decision making (see chart for details).    MDM Rules/Calculators/A&P                          Patient is a 45 year old woman who presents today for evaluation of chest pain.  She states that she has had substernal chest pain constantly for about 2 to 3 weeks now.  She, on Saturday, developed flulike symptoms including nasal congestion, body aches, headache and fatigue. On exam she has some wheezing in all lung fields. Of note she has also been out of her medications due to cost per her report.  Her BNP is slightly elevated, she has not  been taking her diuretic. X-ray without consolidation, pneumothorax or other acute abnormalities.  Flu and COVID test is negative.  She is mildly anemic.  Troponin x1 is negative.  D-dimer is elevated at 0.55.  CTA PE study is ordered.  She does have a history of spontaneous PE in the past.  We will try with albuterol, Tylenol.  At shift change care was transferred to [facility] who will follow pending studies, re-evaulate and determine disposition.    Note: Portions of this report may have been transcribed using voice recognition software. Every effort was made to ensure accuracy; however, inadvertent computerized transcription errors may be present  Final Clinical Impression(s) / ED Diagnoses Final diagnoses:  Flu-like symptoms  Chest pain, unspecified type    Rx / DC Orders ED Discharge Orders     None        Ollen Gross 01/21/21 1542    Isla Pence, MD 01/23/21 0730

## 2021-01-21 NOTE — ED Provider Notes (Signed)
Emergency Medicine Provider Triage Evaluation Note  Alexandria Rhodes , a 45 y.o. female  was evaluated in triage.  Pt complains of productive cough, chest pains and congestion that began earlier this week.  Denies any known fevers.  No known sick contacts.  History of CHF and COPD.  Has not had any medications since March.  Supposed to be taking Lasix, spironolactone, Sherryll Burger, Farxiga.    Review of Systems  Positive: Sob, cough, congestion Negative: Recent long travel, surgery, leg swelling, OCP use or hemoptysis.  Physical Exam  BP (!) 134/105 (BP Location: Right Arm)   Pulse 88   Temp 98.7 F (37.1 C) (Oral)   Resp 17   LMP 03/27/2014 (Approximate)   SpO2 99%  Gen:   Awake, no distress   Resp:  Normal effort  MSK:   Moves extremities without difficulty  Other:  Expiratory wheezes, rhonchi scattered throughout lung fields.  RRR.  Medical Decision Making  Medically screening exam initiated at 11:44 AM.  Appropriate orders placed.  Alexandria Rhodes was informed that the remainder of the evaluation will be completed by another provider, this initial triage assessment does not replace that evaluation, and the importance of remaining in the ED until their evaluation is complete.     Alexandria Rhodes 01/21/21 1146    Alexandria Loll, MD 01/21/21 [phone]

## 2021-01-21 NOTE — ED Triage Notes (Signed)
EMS stated, she has congestion with body aches that started on Sunday.

## 2021-01-21 NOTE — ED Notes (Signed)
DC instructions reviewed with pt. PT verbalized understanding. PT DC °

## 2021-01-21 NOTE — ED Provider Notes (Signed)
Signout from [employer] at shift change.  Patient with history of PE, asthma, CHF presents with chest pain and flulike symptoms.  D-dimer was elevated, currently awaiting CT angio of the chest.  CT of the chest showed possible edema versus infiltrate.  Patient seen and examined.  She is in no distress.  She has been off of her diuretic for several months.  She states that she was not supposed to stop taking this.  She is uncertain if her current symptoms are related to heart failure or infection.  Fortunately flu, COVID test negative.  No fevers reported.  Patient does report intermittent left sided chest pain that is worse with position.  This sounds very pleuritic in nature.  Discussed that lab work today does not suggest MI, PE.  Urged PCP follow-up.  Given unclear picture with flulike illness and cough, will have patient restart Lasix and will prescribe azithromycin and amoxicillin for community-acquired pneumonia treatment.  Otherwise, plan for discharged home.  Lab work reviewed with patient at bedside.  I strongly encouraged her to follow-up with her PCP and cardiologist to make sure her symptoms improve.   Encouraged return to the emergency department with worsening shortness of breath, high fever, persistent vomiting, new symptoms or other concerns.  BP (!) 145/107   Pulse 69   Temp 98.7 F (37.1 C) (Oral)   Resp 16   LMP 03/27/2014 (Approximate)   SpO2 99%     Renne Crigler, PA-C 01/21/21 1859    Melene Plan, DO 01/21/21 2007

## 2021-01-22 ENCOUNTER — Telehealth (HOSPITAL_COMMUNITY): Payer: Self-pay

## 2021-01-22 ENCOUNTER — Other Ambulatory Visit (HOSPITAL_COMMUNITY): Payer: Self-pay

## 2021-01-22 MED ORDER — DAPAGLIFLOZIN PROPANEDIOL 10 MG PO TABS: 10.0000 mg | ORAL_TABLET | Freq: Every day | ORAL | 1 refills | Status: DC

## 2021-01-22 MED ORDER — ENTRESTO 49-51 MG PO TABS: 1.0000 | ORAL_TABLET | Freq: Two times a day (BID) | ORAL | 1 refills | Status: DC

## 2021-01-22 MED ORDER — FUROSEMIDE 40 MG PO TABS: 40.0000 mg | ORAL_TABLET | Freq: Every day | ORAL | 0 refills | Status: DC

## 2021-01-22 MED ORDER — SPIRONOLACTONE 25 MG PO TABS: 12.5000 mg | ORAL_TABLET | Freq: Every day | ORAL | 1 refills | Status: DC

## 2021-01-22 NOTE — Telephone Encounter (Signed)
Refills sent

## 2021-01-22 NOTE — Telephone Encounter (Signed)
Patient needs refills on spironolactone, furosemide, farxiga and entresto.

## 2021-01-23 ENCOUNTER — Other Ambulatory Visit (HOSPITAL_COMMUNITY): Payer: Self-pay

## 2021-01-24 ENCOUNTER — Other Ambulatory Visit (HOSPITAL_COMMUNITY): Payer: Self-pay

## 2021-01-24 ENCOUNTER — Encounter (HOSPITAL_COMMUNITY): Payer: Self-pay | Admitting: Internal Medicine

## 2021-01-24 ENCOUNTER — Ambulatory Visit (HOSPITAL_COMMUNITY)
Admission: RE | Admit: 2021-01-24 | Discharge: 2021-01-24 | Disposition: A | Discharge status: Home / Self Care | Payer: Self-pay | Source: Ambulatory Visit | Attending: Internal Medicine | Admitting: Internal Medicine

## 2021-01-24 VITALS — BP 144/100 | HR 86 | Wt 166.2 lb

## 2021-01-24 DIAGNOSIS — Z91120 Patient's intentional underdosing of medication regimen due to financial hardship: Secondary | ICD-10-CM | POA: Insufficient documentation

## 2021-01-24 DIAGNOSIS — Z9114 Patient's other noncompliance with medication regimen: Secondary | ICD-10-CM

## 2021-01-24 DIAGNOSIS — I5043 Acute on chronic combined systolic (congestive) and diastolic (congestive) heart failure: Secondary | ICD-10-CM | POA: Insufficient documentation

## 2021-01-24 DIAGNOSIS — Z597 Insufficient social insurance and welfare support: Secondary | ICD-10-CM | POA: Insufficient documentation

## 2021-01-24 DIAGNOSIS — T383X6A Underdosing of insulin and oral hypoglycemic [antidiabetic] drugs, initial encounter: Secondary | ICD-10-CM | POA: Insufficient documentation

## 2021-01-24 DIAGNOSIS — T501X6A Underdosing of loop [high-ceiling] diuretics, initial encounter: Secondary | ICD-10-CM | POA: Insufficient documentation

## 2021-01-24 DIAGNOSIS — G43909 Migraine, unspecified, not intractable, without status migrainosus: Secondary | ICD-10-CM | POA: Insufficient documentation

## 2021-01-24 DIAGNOSIS — Z86711 Personal history of pulmonary embolism: Secondary | ICD-10-CM | POA: Insufficient documentation

## 2021-01-24 DIAGNOSIS — I5022 Chronic systolic (congestive) heart failure: Secondary | ICD-10-CM

## 2021-01-24 DIAGNOSIS — I340 Nonrheumatic mitral (valve) insufficiency: Secondary | ICD-10-CM | POA: Insufficient documentation

## 2021-01-24 DIAGNOSIS — I428 Other cardiomyopathies: Secondary | ICD-10-CM | POA: Insufficient documentation

## 2021-01-24 DIAGNOSIS — Z733 Stress, not elsewhere classified: Secondary | ICD-10-CM | POA: Insufficient documentation

## 2021-01-24 DIAGNOSIS — Z87891 Personal history of nicotine dependence: Secondary | ICD-10-CM | POA: Insufficient documentation

## 2021-01-24 DIAGNOSIS — I110 Hypertensive heart disease with heart failure: Secondary | ICD-10-CM | POA: Insufficient documentation

## 2021-01-24 DIAGNOSIS — I10 Essential (primary) hypertension: Secondary | ICD-10-CM

## 2021-01-24 MED ORDER — DAPAGLIFLOZIN PROPANEDIOL 10 MG PO TABS
10.0000 mg | ORAL_TABLET | Freq: Every day | ORAL | 3 refills | Status: DC
Start: 2021-01-24 — End: 2021-06-05
  Filled 2021-01-24: qty 30, 30d supply, fill #0
  Filled 2021-02-17: qty 30, 30d supply, fill #1
  Filled 2021-03-24: qty 30, 30d supply, fill #2
  Filled 2021-04-21: qty 30, 30d supply, fill #3

## 2021-01-24 MED ORDER — POTASSIUM CHLORIDE CRYS ER 20 MEQ PO TBCR
20.0000 meq | EXTENDED_RELEASE_TABLET | Freq: Every day | ORAL | 3 refills | Status: DC
Start: 2021-01-24 — End: 2021-06-05
  Filled 2021-01-24: qty 30, 30d supply, fill #0
  Filled 2021-02-17: qty 30, 30d supply, fill #1
  Filled 2021-03-24: qty 30, 30d supply, fill #2
  Filled 2021-04-21: qty 30, 30d supply, fill #3

## 2021-01-24 MED ORDER — FUROSEMIDE 40 MG PO TABS
40.0000 mg | ORAL_TABLET | Freq: Every day | ORAL | 3 refills | Status: DC
  Filled 2021-01-24: qty 30, 30d supply, fill #0
  Filled 2021-02-17: qty 30, 30d supply, fill #1
  Filled 2021-03-24: qty 30, 30d supply, fill #2
  Filled 2021-04-21: qty 30, 30d supply, fill #3

## 2021-01-24 MED ORDER — ENTRESTO 49-51 MG PO TABS: 1.0000 | ORAL_TABLET | Freq: Two times a day (BID) | ORAL | 1 refills | Status: DC

## 2021-01-24 MED ORDER — SPIRONOLACTONE 25 MG PO TABS
25.0000 mg | ORAL_TABLET | Freq: Every day | ORAL | 3 refills | Status: DC
  Filled 2021-01-24: qty 30, 30d supply, fill #0

## 2021-01-24 NOTE — Patient Instructions (Signed)
Restart:   Entresto 49/51 mg Twice daily *we have given you samples Furosemide 40 mg Daily Potassium 20 meq Daily Spironolactone 25 mg Daily Farxiga 10 mg Daily  *All prescriptions have been sent to [facility]  You have been referred to our [facility], they will call you to schedule a home visit  Please follow up with our heart failure pharmacist in 2-3 weeks  Your physician has requested that you have an echocardiogram. Echocardiography is a painless test that uses sound waves to create images of your heart. It provides your doctor with information about the size and shape of your heart and how well your heart's chambers and valves are working. This procedure takes approximately one hour. There are no restrictions for this procedure.  Your physician recommends that you schedule a follow-up appointment in: 6 weeks  Do the following things EVERYDAY: Weigh yourself in the morning before breakfast. Write it down and keep it in a log. Take your medicines as prescribed Eat low salt foods--Limit salt (sodium) to 2000 mg per day.  Stay as active as you can everyday Limit all fluids for the day to less than 2 liters  If you have any questions or concerns before your next appointment please send [place] a message through [place] or call our office at [phone].    TO LEAVE A MESSAGE FOR THE NURSE SELECT OPTION 2, PLEASE LEAVE A MESSAGE INCLUDING: YOUR NAME DATE OF BIRTH CALL BACK NUMBER REASON FOR CALL**this is important as we prioritize the call backs  YOU WILL RECEIVE A CALL BACK THE SAME DAY AS LONG AS YOU CALL BEFORE 4:00 PM

## 2021-01-24 NOTE — Progress Notes (Signed)
ReDS Vest / Clip - 01/24/21 1000       ReDS Vest / Clip   Station Marker A    Ruler Value 24    ReDS Value Range Low volume    ReDS Actual Value 38

## 2021-01-24 NOTE — Progress Notes (Signed)
Pt signed [employer] pt assist application, she will bing her tax info to submit with forms. Other cardiac meds sent to [facility] under HF FUND.

## 2021-01-24 NOTE — Progress Notes (Signed)
Medication Samples have been provided to the patient.  Drug name: Sherryll Burger       Strength: 49/51        Qty: 2  LOT: [id number]  Exp.Date: 01/2023  Dosing instructions: Take 1 tablet Twice daily   The patient has been instructed regarding the correct time, dose, and frequency of taking this medication, including desired effects and most common side effects.   Smitty Cords Khamil Lamica 10:16 AM 01/24/2021

## 2021-01-24 NOTE — Progress Notes (Signed)
ADVANCED HEART FAILURE CLINIC NOTE  PCP: Raliegh Ip FNP  HF Cardiologist: Dr Gala Romney   Reason for visit: Heart Failure   HPI: Alexandria Rhodes is a 45 y.o. female with a history of non-ischemic cardiomyopathy with EF as low as 10% in 2016 but most recent  40-45% on Echo in 1/22, prior PE in 03/2018 no longer on anticoagulation, migraine, and hypertension.  She was admitted to a hospital in [place] in 08/2014 after presenting with CP and SOB.  She was found to have an EF of 10%. She states she underwent heart cath which showed no CAD. She was followed by Dr. Antionette Char [facility] [place].  She was started on GDMT and was on Entresto 97-103 mg bid,  Coreg 6.25 mg bid and Spironolactone 12.5 mg daily.  She moved to [place] at the end of 2019.  Admitted to [facility] in 03/2018 with CP and found to have an acute PE felt to be provoked by recent move from [place]. Echo at that time showed LVEF of 65-70% with normal wall motion, grade 1 diastolic dysfunction, trivial MR, normal biatrial size, normal RV function, and dilated IVC.  She was started on Eliquis but was only able to complete 1 month of therapy due to cost.  She also could not afford her HF medications and has not been on anything in the past year.  Admitted to [facility] on 07/20/19 with increased SOB. She  Had been out of her medications for a week. Diuresed with IV lasix and started on HF meds. D/C weight 156 pounds. Given all medications from HF fund at discharge. Referred to HF social work to help w/ meds and transportation to and from appts.   cMRI 5/21 1. Severely dilated left ventricle with normal wall thickness and severely impaired systolic function (LVEF = 29%). Diffuse hypokinesis and paradoxical septal motion. There is midwall late gadolinium enhancement - non-specific sign consistent with non-ischemic cardiomyopathy.Significant trabeculations are seen predominantly in the basal and mid myocardial  segments with ratio of non-compacted to compacted myocardium > 5:1.    She was seen for post hospital follow-up back in June 2021 and was doing fairly well.  She did have a rash on her torso and upper extremities which she felt was from digoxin.  Digoxin was discontinued.  Due to soft BP, we were unable to further titrate her medications. She was instructed to follow-up with pharmacy 2 to 3 weeks later for further med titration however she canceled the appointment and did not reschedule.   Seen in clinic 1/22 with echo EF 40-45%  Here for f/u. Has been out of her medicines since March. Seen in ER on 01/21/21 for flu-like symptoms. CT chest with no PE. Mild ground glass. Atypical PNA vs HF. BNP 593. Given IV lasix and abx/inhalers. Flu and covid negative. Denies LE edema. + DOE and orthopnea.      Echo 04/10/20: EF 40-45% Echo 07/2019: EF 20-25% Grade II DD Echo 03/2018: EF 65-70%   ROS: All systems negative except as listed in HPI, PMH and Problem List.  SH:  Social History   Socioeconomic History   Marital status: Single    Spouse name: Not on file   Number of children: Not on file   Years of education: Not on file   Highest education level: Not on file  Occupational History   Not on file  Tobacco Use   Smoking status: Former   Smokeless tobacco: Never  Vaping Use   Vaping Use: Never used  Substance and Sexual Activity   Alcohol use: Not Currently   Drug use: Not Currently   Sexual activity: Yes  Other Topics Concern   Not on file  Social History Narrative   Not on file   Social Determinants of Health   Financial Resource Strain: Not on file  Food Insecurity: Not on file  Transportation Needs: Not on file  Physical Activity: Not on file  Stress: Not on file  Social Connections: Not on file  Intimate Partner Violence: Not on file   FH:  Family History  Problem Relation Age of Onset   Hypertension Mother    Past Medical History:  Diagnosis Date   Acute  pulmonary embolism (HCC)    Cardiomyopathy (HCC)    CHF (congestive heart failure) (HCC)    Hypertension    Non-ischemic cardiomyopathy (HCC) 07/2019   Current Outpatient Medications  Medication Sig Dispense Refill   acetaminophen (TYLENOL) 500 MG tablet Take 1,000-1,500 mg by mouth 2 (two) times daily as needed for mild pain or headache.     amoxicillin (AMOXIL) 500 MG capsule Take 2 capsules (1,000 mg total) by mouth 3 (three) times daily for 5 days. 30 capsule 0   azithromycin (ZITHROMAX) 250 MG tablet Take first 2 tablets together on day 1 then 1 tablet every day until finished. 6 tablet 0   dapagliflozin propanediol (FARXIGA) 10 MG TABS tablet TAKE 1 TABLET (10 MG TOTAL) BY MOUTH DAILY. 90 tablet 1   furosemide (LASIX) 40 MG tablet Take 1 tablet (40 mg total) by mouth daily. 30 tablet 0   potassium chloride SA (KLOR-CON) 20 MEQ tablet TAKE 1 TABLET (20 MEQ TOTAL) BY MOUTH DAILY. 90 tablet 1   sacubitril-valsartan (ENTRESTO) 49-51 MG Take 1 tablet by mouth 2 (two) times daily. 180 tablet 1   spironolactone (ALDACTONE) 25 MG tablet TAKE 1/2 TABLET BY MOUTH ONCE DAILY. 45 tablet 1   SUMAtriptan (IMITREX) 50 MG tablet Take 50 mg by mouth every 2 (two) hours as needed for migraine. May repeat in 2 hours if headache persists or recurs.     No current facility-administered medications for this encounter.   BP (!) 144/100   Pulse 86   Wt 75.4 kg (166 lb 3.2 oz)   LMP 03/27/2014 (Approximate)   SpO2 99%   BMI 31.40 kg/m   Wt Readings from Last 3 Encounters:  01/24/21 75.4 kg (166 lb 3.2 oz)  09/09/20 79.4 kg (175 lb)  04/10/20 74.6 kg (164 lb 6.4 oz)   PHYSICAL EXAM: General:  Fatigued appearing. No resp difficulty HEENT: normal Neck: supple. no JVD. Carotids 2+ bilat; no bruits. No lymphadenopathy or thryomegaly appreciated. Cor: PMI nondisplaced. Regular rate & rhythm. No rubs, gallops or murmurs. Lungs: clear Abdomen: soft, nontender, nondistended. No hepatosplenomegaly. No  bruits or masses. Good bowel sounds. Extremities: no cyanosis, clubbing, rash, edema Neuro: alert & orientedx3, cranial nerves grossly intact. moves all 4 extremities w/o difficulty. Affect pleasant   Reds: 38%  ASSESSMENT & PLAN:  1. 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  She was started on guideline based medications and EF improved to 65-70% on echo in 03/2018.   - Echo 5/21 EF back down to 20-25% (in the setting of poor med compliance) with global hypokinesis, grade II diastolic dysfunction, severely dilated left atrium, moderate MR - cMRI 5/21 EF 29% minimal LGE. +  non-compaction. Severe LAE. Mod-severe MR. Normal RV - Echo 04/10/20 EF 40-45%  - Has been out of meds for 9 months.  - Remains NYHA II-III. Volume status and BP up. ReDS 38% - Restart Entresto 49/51 mg bid. (Was on 97/103 bid) - Restart spironolactone 25 mg daily - Restart lasix 40 mg daily. - Restart Farxiga 10 daily - Add k 40 daily - Off coreg due to hypotension/bradycardia.   - She is off digoxin given previous rash and improvement of EF - Labs today and in 2 weeks. - Long talk about need for compliance with meds. SW to see today to enroll in Paramedicine - Repeat echo - PharmD visits q2weeks x 3  2. HTN - Blood pressure elevated, has been off meds for 9 months. - Plan as above  3. History of PE - Provoked in the setting of prolonged travel. - Treated w/ Eliquis. No longer on a/c.  4. Non-compliance - See plan as above. Enroll Paramedicine  Total time spent 45 minutes. Over half that time spent discussing above.    Arvilla Meres, MD  11:25 AM

## 2021-01-24 NOTE — Progress Notes (Signed)
Heart and Vascular Care Navigation  01/24/2021  Alexandria Rhodes April 07, 1975 [id number]  Reason for Referral: CSW referred to assist patient with insurance and resources.   Engaged with patient face to face for initial visit for Heart and Vascular Care Coordination.                                                                                                   Assessment: Patient is a 45 yo female who lives in an apartment with her sister.   She reports she works at [employer] and her schedule varies from week to week. Patient states she is uninsured and has had insurance on and off recently due to employment. She admits to not taking her medications for sometime due to lack of insurance and inability to get to the [facility] outpatient pharmacy when she was working. Patient states "part of this is my own fault". She reports she is connected with [employer] and denies concerns with transportation now to he medical appointments. She states she has a PCP although has not seen recently.                               HRT/VAS Care Coordination     Patients Home Cardiology Office Heart Failure Clinic   Outpatient Care Team Social Worker   Social Worker Name: Lasandra Beech, [place] [phone]   Living arrangements for the past 2 months Apartment   Lives with: Siblings   Patient Current Insurance Coverage Self-Pay   Patient Has Concern With Paying Medical Bills Yes   Medical Bill Referrals: Referral to [facility] Financial Counseling   Does Patient Have Prescription Coverage? No   Patient Prescription Assistance Programs Heart Failure Fund; Patient Assistance Programs   Patient Assistance Programs Medications Novartis for [employer]- expires 08/01/20   Home Assistive Devices/Equipment Scales   DME Agency NA   HH Agency NA       Social History:                                                                             SDOH Screenings   Alcohol Screen: Not on file  Depression (PHQ2-9): Not on  file  Financial Resource Strain: Medium Risk   Difficulty of Paying Living Expenses: Somewhat hard  Food Insecurity: Food Insecurity Present   Worried About Programme researcher, broadcasting/film/video in the Last Year: Sometimes true   Ran Out of Food in the Last Year: Sometimes true  Housing: Low Risk    Last Housing Risk Score: 0  Physical Activity: Not on file  Social Connections: Not on file  Stress: Not on file  Tobacco Use: Medium Risk   Smoking Tobacco Use: Former   Smokeless Tobacco Use: Never   Passive Exposure: Not on  Chartered certified accountant Needs: Personal assistant (Medical): Yes   Lack of Transportation (Non-Medical): Yes    SDOH Interventions: Financial Resources:  Corporate treasurer Interventions: Intervention Not Indicated Editor, commissioning for [employer] Program  Food Insecurity:  Food Insecurity Interventions: Intervention Not Indicated  Housing Insecurity:  Housing Interventions: Intervention Not Indicated  Transportation:   Transportation Interventions: Retail banker   Follow-up plan:  CSW provided information on the [employer] Assistance program to assist with medical bills. HF staff working with patient on obtaining her medications through the HF Fund and completing a PAP application for [employer]. CSW encouraged patient to make appointment for annual follow up with [facility] P[facility] at [facility]. CSW will forward to Rosetta Posner, LCSW to enroll in [employer] for added followed and assistance with medications. CSW available as needed. Lasandra Beech, LCSW, CCSW-MCS [phone]

## 2021-01-28 ENCOUNTER — Other Ambulatory Visit (HOSPITAL_COMMUNITY): Payer: Self-pay | Admitting: *Deleted

## 2021-01-28 MED ORDER — ENTRESTO 49-51 MG PO TABS: 1.0000 | ORAL_TABLET | Freq: Two times a day (BID) | ORAL | 3 refills | Status: DC

## 2021-01-29 ENCOUNTER — Telehealth (HOSPITAL_COMMUNITY): Payer: Self-pay | Admitting: Licensed Clinical Social Worker

## 2021-01-29 NOTE — Telephone Encounter (Signed)
CSW called pt to assess pt for enrollment in [employer]- unable to reach- left VM requesting return call  Burna Sis, LCSW Clinical Social Worker Advanced Heart Failure Clinic Desk#: [phone] Cell#: [phone]

## 2021-01-31 ENCOUNTER — Telehealth (HOSPITAL_COMMUNITY): Payer: Self-pay | Admitting: Licensed Clinical Social Worker

## 2021-01-31 NOTE — Telephone Encounter (Signed)
CSW called pt to discuss enrollment in [facility] program- unable to reach- left VM requesting return call  Alexandria Henneman H. Zainah Steven, LCSW Clinical Social Worker Advanced [facility] Failure Clinic Desk#: [phone] Cell#: [phone]  

## 2021-02-03 ENCOUNTER — Other Ambulatory Visit (HOSPITAL_COMMUNITY): Payer: Self-pay | Admitting: Internal Medicine

## 2021-02-03 ENCOUNTER — Ambulatory Visit (HOSPITAL_COMMUNITY)
Admission: RE | Admit: 2021-02-03 | Discharge: 2021-02-03 | Disposition: A | Discharge status: Home / Self Care | Payer: Self-pay | Source: Ambulatory Visit | Attending: Internal Medicine | Admitting: Internal Medicine

## 2021-02-03 ENCOUNTER — Other Ambulatory Visit: Payer: Self-pay

## 2021-02-03 DIAGNOSIS — I110 Hypertensive heart disease with heart failure: Secondary | ICD-10-CM | POA: Insufficient documentation

## 2021-02-03 DIAGNOSIS — I509 Heart failure, unspecified: Secondary | ICD-10-CM | POA: Insufficient documentation

## 2021-02-03 DIAGNOSIS — I340 Nonrheumatic mitral (valve) insufficiency: Secondary | ICD-10-CM | POA: Insufficient documentation

## 2021-02-03 DIAGNOSIS — Z86711 Personal history of pulmonary embolism: Secondary | ICD-10-CM | POA: Insufficient documentation

## 2021-02-03 DIAGNOSIS — I429 Cardiomyopathy, unspecified: Secondary | ICD-10-CM | POA: Insufficient documentation

## 2021-02-03 DIAGNOSIS — I5022 Chronic systolic (congestive) heart failure: Secondary | ICD-10-CM

## 2021-02-03 LAB — ECHOCARDIOGRAM COMPLETE
AR max vel: 1.62 cm2
AV Area VTI: 1.37 cm2
AV Area mean vel: 1.61 cm2
AV Mean grad: 5.7 mmHg
AV Peak grad: 10.8 mmHg
Ao pk vel: 1.64 m/s
Area-P 1/2: 3.07 cm2
MV M vel: 4.58 m/s
MV Peak grad: 83.9 mmHg
Radius: 0.7 cm
S' Lateral: 5.4 cm

## 2021-02-04 ENCOUNTER — Telehealth (HOSPITAL_COMMUNITY): Payer: Self-pay | Admitting: Pharmacy Technician

## 2021-02-04 NOTE — Telephone Encounter (Signed)
Advanced Heart Failure Patient Advocate Encounter  Patient is currently uninsured and needs assistance with Entresto. Faxed in [employer] application 11/18.  Patient did not provide POI, and is aware that it is required for processing of the application.   Will follow up.

## 2021-02-05 NOTE — Progress Notes (Signed)
PCP: Raliegh Ip FNP  HF Cardiologist: Dr Gala Romney     HPI:  Alexandria Rhodes is a 45 y.o. female with a history of non-ischemic cardiomyopathy with EF as low as 10% in 2016 but most recent  40-45% on Echo in 03/2020, prior PE in 03/2018 no longer on anticoagulation, migraine, and hypertension.   She was admitted to a hospital in [place] in 08/2014 after presenting with CP and SOB.  She was found to have an EF of 10%. She states she underwent heart cath which showed no CAD. [facility] [place].  She was started on GDMT and was on Entresto 97-103 mg BID,  carvedilol 6.25 mg BID and spironolactone 12.5 mg daily.  She moved to [place] at the end of 2019.   Admitted to [facility] in 03/2018 with CP and found to have an acute PE felt to be provoked by recent move from [place]. Echo at that time showed LVEF of 65-70% with normal wall motion, grade 1 diastolic dysfunction, trivial MR, normal biatrial size, normal RV function, and dilated IVC.  She was started on Eliquis but was only able to complete 1 month of therapy due to cost.  She also could not afford her HF medications and had not been on anything in the past year.   Admitted to [facility] on 07/20/19 with increased SOB. She  Had been out of her medications for a week. Diuresed with IV Lasix and started on HF meds. D/C weight 156 pounds. Given all medications from HF fund at discharge. Referred to HF social work to help w/ meds and transportation to and from appts.    cMRI 07/2019 1. Severely dilated left ventricle with normal wall thickness and severely impaired systolic function (LVEF = 29%). Diffuse hypokinesis and paradoxical septal motion. There is midwall late gadolinium enhancement - non-specific sign consistent with non-ischemic cardiomyopathy. Significant trabeculations are seen predominantly in the basal and mid myocardial segments with ratio of non-compacted to compacted myocardium >  5:1.     She was seen for post hospital follow-up back in June 2021 and was doing fairly well.  She did have a rash on her torso and upper extremities which she felt was from digoxin.  Digoxin was discontinued.  Due to soft BP, we were unable to further titrate her medications. She was instructed to follow-up with pharmacy 2 to 3 weeks later for further med titration however she canceled the appointment and did not reschedule.    Seen in clinic 03/2020 with echo EF 40-45%  Echo 02/03/21, EF 30-35%, RV normal   Recently presented to HF Clinic 01/24/21. Had been out of her medicines since March. Seen in ER on 01/21/21 for flu-like symptoms. CT chest with no PE. Mild ground glass. Atypical PNA vs HF. BNP 593. Given IV Lasix and abx/inhalers. Flu and covid negative. Denied LE edema. + DOE and orthopnea.     Today she returns to HF clinic for pharmacist medication titration. At last visit with MD, she was restarted on Lasix 40 mg daily, Entresto 49/51 mg BID, spironolactone 25 mg daily, Farxiga 10 mg daily and potassium chloride 20 mEq daily. Of note, she has only been taking spironolactone 12.5 mg daily (medication list updated). Overall is not feeling well today. Says she has been feeling very weak since her medications were restarted. She can't move around like she wants to. She feels constant dizziness and is nauseous. Her SBP  at home has been ~95-103. No syncope/presyncope. No CP or palpitations. No SOB/DOE. Does not weigh herself at home. Weight in clinic down 10 lbs from last visit. No LEE, PND or orthopnea. Entresto PAP through [employer] pending, she needs to bring in POI before she will be approved.     HF Medications: Entresto 49/51 mg BID Spironolactone 25 mg daily - only taking 12.5 mg daily Farxiga 10 mg daily Lasix 40 mg daily Potassium Chloride 20 mEq daily  Has the patient been experiencing any side effects to the medications prescribed?  As above, feeling weak and dizzy since  medications were restarted. BP has been low.   Does the patient have any problems obtaining medications due to transportation or finances?   No insurance. Obtains HF medications through HF fund at [facility]. Novartis PAP for [employer] pending (instructed her to bring in 1st two pages of her tax return).   Understanding of regimen: good Understanding of indications: good Potential of compliance: fair Patient understands to avoid NSAIDs. Patient understands to avoid decongestants.    Pertinent Lab Values: 01/21/21: Serum creatinine 0.87, BUN 8, Potassium 3.9, Sodium 139 BMET today pending.   Vital Signs: Weight: 156.4 lbs (last clinic weight: 166.2 lbs) Blood pressure: 104/66  Heart rate: 65   Assessment/Plan: 1. 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  She was started on guideline based medications and EF improved to 65-70% on echo in 03/2018.   - Echo 07/2019 EF back down to 20-25% (in the setting of poor med compliance) with global hypokinesis, grade II diastolic dysfunction, severely dilated left atrium, moderate MR - cMRI 07/2019 EF 29% minimal LGE. + non-compaction. Severe LAE. Mod-severe MR. Normal RV - Echo 04/10/20 EF 40-45%  - Echo 02/03/21 EF 30-35%, RV normal - Remains NYHA II-III. Appears euvolemic on exam. - BMET pending.  - Continue Lasix 40 mg daily and potassium chloride 20 mEq daily - Decrease Entresto to 24/26 mg BID due to hypotension.  - Continue spironolactone 12.5 mg daily. Only taking 12.5 mg daily, not 25 mg daily as instructed. Will continue current dose for now.  - Continue Farxiga 10 daily - Off carvedilol due to hypotension/bradycardia.   - She is off digoxin given previous rash and improvement of EF - Saw SW today to enroll in Paramedicine, appreciate their assistance.    2. HTN - SBP low, decrease Entresto as above.    3. History of PE - Provoked in the setting of prolonged travel. -  Treated w/ Eliquis. No longer on a/c.   4. Non-compliance - See plan as above. Enroll in Paramedicine  Follow up 3 weeks with APP Clinic   Karle Plumber, PharmD, BCPS, BCCP, CPP Heart Failure Clinic Pharmacist [phone]

## 2021-02-13 ENCOUNTER — Telehealth (HOSPITAL_COMMUNITY): Payer: Self-pay | Admitting: Licensed Clinical Social Worker

## 2021-02-13 NOTE — Telephone Encounter (Signed)
CSW called pt to discuss enrollment in community paramedicine program.  Unable to reach- left VM requesting return call.  Put note in to see me during pharmacy visit on Monday if pt comes to appt.  Will continue to follow and assist as needed  Burna Sis, LCSW Clinical Social Worker Advanced Heart Failure Clinic Desk#: [phone] Cell#: [phone]

## 2021-02-17 ENCOUNTER — Other Ambulatory Visit (HOSPITAL_COMMUNITY): Payer: Self-pay

## 2021-02-17 ENCOUNTER — Telehealth (HOSPITAL_COMMUNITY): Payer: Self-pay | Admitting: Pharmacist

## 2021-02-17 ENCOUNTER — Ambulatory Visit (HOSPITAL_COMMUNITY)
Admission: RE | Admit: 2021-02-17 | Discharge: 2021-02-17 | Disposition: A | Discharge status: Home / Self Care | Payer: Self-pay | Source: Ambulatory Visit | Attending: Family Medicine | Admitting: Family Medicine

## 2021-02-17 VITALS — BP 104/66 | HR 65 | Wt 156.4 lb

## 2021-02-17 DIAGNOSIS — Z86711 Personal history of pulmonary embolism: Secondary | ICD-10-CM | POA: Insufficient documentation

## 2021-02-17 DIAGNOSIS — I428 Other cardiomyopathies: Secondary | ICD-10-CM | POA: Insufficient documentation

## 2021-02-17 DIAGNOSIS — R110 Nausea: Secondary | ICD-10-CM | POA: Insufficient documentation

## 2021-02-17 DIAGNOSIS — Z7984 Long term (current) use of oral hypoglycemic drugs: Secondary | ICD-10-CM | POA: Insufficient documentation

## 2021-02-17 DIAGNOSIS — R42 Dizziness and giddiness: Secondary | ICD-10-CM | POA: Insufficient documentation

## 2021-02-17 DIAGNOSIS — Z91199 Patient's noncompliance with other medical treatment and regimen due to unspecified reason: Secondary | ICD-10-CM | POA: Insufficient documentation

## 2021-02-17 DIAGNOSIS — Z79899 Other long term (current) drug therapy: Secondary | ICD-10-CM | POA: Insufficient documentation

## 2021-02-17 DIAGNOSIS — Z09 Encounter for follow-up examination after completed treatment for conditions other than malignant neoplasm: Secondary | ICD-10-CM | POA: Insufficient documentation

## 2021-02-17 DIAGNOSIS — R531 Weakness: Secondary | ICD-10-CM | POA: Insufficient documentation

## 2021-02-17 DIAGNOSIS — I110 Hypertensive heart disease with heart failure: Secondary | ICD-10-CM | POA: Insufficient documentation

## 2021-02-17 DIAGNOSIS — I5022 Chronic systolic (congestive) heart failure: Secondary | ICD-10-CM | POA: Insufficient documentation

## 2021-02-17 LAB — BASIC METABOLIC PANEL
Anion gap: 9 (ref 5–15)
BUN: 12 mg/dL (ref 6–20)
CO2: 27 mmol/L (ref 22–32)
Calcium: 9.6 mg/dL (ref 8.9–10.3)
Chloride: 102 mmol/L (ref 98–111)
Creatinine, Ser: 0.97 mg/dL (ref 0.44–1.00)
GFR, Estimated: >60 mL/min (ref >60)
Glucose, Bld: 92 mg/dL (ref 70–99)
Potassium: 3.8 mmol/L (ref 3.5–5.1)
Sodium: 138 mmol/L (ref 135–145)

## 2021-02-17 MED ORDER — SACUBITRIL-VALSARTAN 24-26 MG PO TABS: 1.0000 | ORAL_TABLET | Freq: Two times a day (BID) | ORAL | 11 refills | Status: DC

## 2021-02-17 MED ORDER — SPIRONOLACTONE 25 MG PO TABS
12.5000 mg | ORAL_TABLET | Freq: Every day | ORAL | 5 refills | Status: DC
  Filled 2021-02-17: qty 15, 30d supply, fill #0
  Filled 2021-03-24: qty 15, 30d supply, fill #1
  Filled 2021-06-05 – 2021-06-17 (×2): qty 15, 30d supply, fill #2
  Filled 2021-10-20: qty 15, 30d supply, fill #3
  Filled 2021-12-12: qty 15, 30d supply, fill #4
  Filled 2022-02-03: qty 15, 30d supply, fill #5

## 2021-02-17 NOTE — Patient Instructions (Addendum)
It was a pleasure seeing you today!  MEDICATIONS: -We are changing your medications today -Decrease Entresto to 24/26 mg (1 tablet) twice daily.  -Please bring in the first 2 pages of your tax return so we can apply for patient assistance for Entresto. -Call if you have questions about your medications.  LABS: -We will call you if your labs need attention.  NEXT APPOINTMENT: Return to clinic in 3 weeks with APP Clinic.  In general, to take care of your heart failure: -Limit your fluid intake to 2 Liters (half-gallon) per day.   -Limit your salt intake to ideally 2-3 grams (2000-3000 mg) per day. -Weigh yourself daily and record, and bring that "weight diary" to your next appointment.  (Weight gain of 2-3 pounds in 1 day typically means fluid weight.) -The medications for your heart are to help your heart and help you live longer.   -Please contact us before stopping any of your heart medications.  Call the clinic at [phone] with questions or to reschedule future appointments.

## 2021-02-17 NOTE — Progress Notes (Signed)
Heart and Vascular Care Navigation  02/17/2021  Lendy Dittrich [birth year] [id number]  Reason for Referral: Paramedicine enrollment   Engaged with patient face to face for initial visit for Heart and Vascular Care Coordination.                                                                                                   Paramedicine Initial Assessment:  Housing:  In what kind of housing do you live? House/apt/trailer/shelter? apartment  Do you rent/pay a mortgage/own? Rent- she assists with $600 of rent each month  Do you live with anyone? Her sister and her sisters kid (14yo)  Are you currently worried about losing your housing? No but worried about paying rent this month as she has been unable to work due to being sick.  Within the past 12 months have you ever stayed outside, in a car, tent, a shelter, or temporarily with someone? no  Within the past 12 months have you been unable to get utilities when it was really needed? no  Worried about paying for rent this month- informed her of possible patient care fund assistance and that we would need w-9 and invoice from management company in order to proceed.  Social:  What is your current marital status? unmarried  Do you have family or friends who live locally? No one other than her sister- sister listed at emergency contact  Food:  Within the past 12 months were you ever worried that food would run out before you got money to buy more? yes  Within the past 62months have you run out of food and didn't have money to buy more? Yes  Buys her own food and is struggling to get enough this month since she was out last 1-2 weeks due to illness.  Used to get food stamps but lost them when she returned to work as she made too much.  CSW provided with food pantry list and with box of food provided by local church.  Income:  What is your current source of income? Works for [employer] hard is it for you to pay for the basics like  food housing, medical care, and utilities? Somewhat hard  Do you have outstanding medical bills? Yes- was provided with CAFA last time she was here but hasn't completed yet- says shes working on getting documents together.  Insurance:  Are you currently insured? No  Do you have prescription coverage? No utilizes HF Fund  If no insurance, have you applied for coverage (Medicaid, disability, marketplace etc)? No- used to have through [facility] but prescription costs were still too much so she couldn't afford to keep.  Transportation:  Do you have transportation to your medical appointments? Uses the bus system but understands she is enrolled in [employer] and can utilize to get to [employer]- confirms she has their number to schedules rides.    Daily Health Needs: Do you have a working scale at home? yes  How do you manage your medications at home? Organizes a pill box herself- feels confident she is doing this correctly.  Do  you ever take your medications differently than prescribed? no  Do you have issues affording your medications? no  If yes, has this ever prevented you from obtaining medications? N/a  Do you have any concerns with mobility at home? no  Do you use any assistive devices at home or have PCS at home? no  Do you have a PCP? Raliegh Ip  Do you have any trouble reading or writing? no  Are there any additional barriers you see to getting the care you need? no                                   HRT/VAS Care Coordination     Patients Home Cardiology Office Heart Failure Clinic   Outpatient Care Team [facility] Paramed[facility]; Social Worker   Social Worker Name: Lasandra Beech, [place] [phone]   Living arrangements for the past 2 months Apartment   Lives with: Siblings   Patient Current Insurance Coverage Self-Pay   Patient Has Concern With Paying Medical Bills Yes   Medical Bill Referrals: Referral to [facility] Financial Counseling   Does Patient Have Prescription  Coverage? No   Patient Prescription Assistance Programs Heart Failure Fund; Patient Assistance Programs   Patient Assistance Programs Medications Novartis for [employer]- expires 08/01/20   Home Assistive Devices/Equipment Scales   DME Agency NA   HH Agency NA       Social History:                                                                             SDOH Screenings   Alcohol Screen: Not on file  Depression (PHQ2-9): Not on file  Financial Resource Strain: Medium Risk   Difficulty of Paying Living Expenses: Somewhat hard  Food Insecurity: Food Insecurity Present   Worried About Programme researcher, broadcasting/film/video in the Last Year: Sometimes true   Ran Out of Food in the Last Year: Sometimes true  Housing: Low Risk    Last Housing Risk Score: 0  Physical Activity: Not on file  Social Connections: Not on file  Stress: Not on file  Tobacco Use: Medium Risk   Smoking Tobacco Use: Former   Smokeless Tobacco Use: Never   Passive Exposure: Not on Chartered certified accountant Needs: Unmet Transportation Needs   Lack of Transportation (Medical): Yes   Lack of Transportation (Non-Medical): Yes    SDOH Interventions: Financial Resources:    Editor, commissioning for [employer], and patient care fund for potential rent assistance  Food Insecurity:   Food pantry referral and food box  Housing Insecurity:   Concerns with paying rent  Transportation:    Re-referred to [employer]   Follow-up plan:    Pt to complete CAFA and turn in to get help with cone bills.  Pt to inquire with sister about rental company so we can pursue patient care fund assistance.  CSW to send out referral to paramedics for assignment.  Burna Sis, LCSW Clinical Social Worker Advanced Heart Failure Clinic Desk#: [phone] Cell#: [phone]

## 2021-02-17 NOTE — Telephone Encounter (Signed)
Provided patient with Entresto samples.  Medication: Entresto 24/26 mg Quantity: 2 bottles  Lot: [id number]; [id number] Expiration date: 12/2022; 04/2023  Karle Plumber, PharmD, BCPS, CPP Heart Failure Clinic Pharmacist [phone]

## 2021-02-18 ENCOUNTER — Telehealth (HOSPITAL_COMMUNITY): Payer: Self-pay

## 2021-02-18 NOTE — Telephone Encounter (Signed)
Received referral for Ms. Sheehan from HF clinic to establish paramedicine. Rosetta Posner LCSW spoke to patient and the patient requesting text message communication. I sent a text to Ms. Leite today 12/16 at 0815 and will continue to follow to establish paramedicine.

## 2021-02-20 ENCOUNTER — Telehealth (HOSPITAL_COMMUNITY): Payer: Self-pay

## 2021-02-20 ENCOUNTER — Telehealth (HOSPITAL_COMMUNITY): Payer: Self-pay | Admitting: Licensed Clinical Social Worker

## 2021-02-20 NOTE — Telephone Encounter (Signed)
Pt requested assistance with finding housing.  CSW looked up housing options in her price range and sent to pt to look into further.  Informed pt we could potentially assist with start up costs with new housing as well once she finds one to move into.  Will continue to follow and assist as needed  Burna Sis, LCSW Clinical Social Worker Advanced [facility] Failure Clinic Desk#: [phone] Cell#: [phone]

## 2021-02-20 NOTE — Telephone Encounter (Signed)
Alexandria Rhodes texted me back and agreed on Monday 12/12 home visit at 11:00. Will see patient in the home at that time.

## 2021-02-27 ENCOUNTER — Other Ambulatory Visit (HOSPITAL_COMMUNITY): Payer: Self-pay

## 2021-02-27 NOTE — Progress Notes (Signed)
Paramedicine Encounter    Patient ID: Alexandria Rhodes, female    DOB: 06-15-1975, 45 y.o.   MRN: [id number]  Attempted to see Ms. Vandam in the home today however she canceled and reports she will call me back to reschedule at a later date. I will follow up Monday.    ACTION: Next call planned for monday

## 2021-03-07 NOTE — Progress Notes (Addendum)
ADVANCED HEART FAILURE CLINIC NOTE  PCP: Raliegh Ip FNP  HF Cardiologist: Dr Gala Romney   Reason for visit: Heart Failure   HPI: Alexandria Rhodes is a 45 y.o. female with a history of non-ischemic cardiomyopathy with EF as low as 10% in 2016 but most recent  40-45% on Echo in 1/22, prior PE in 03/2018 no longer on anticoagulation, migraine, and hypertension.  She was admitted to a hospital in [place] in 08/2014 after presenting with CP and SOB.  She was found to have an EF of 10%. She states she underwent heart cath which showed no CAD. She was followed by Dr. Antionette Char at [facility] [place].  She was started on GDMT and was on Entresto 97-103 mg bid,  Coreg 6.25 mg bid and Spironolactone 12.5 mg daily.  She moved to [place] at the end of 2019.  Admitted to [facility] in 03/2018 with CP and found to have an acute PE felt to be provoked by recent move from [place]. Echo at that time showed LVEF of 65-70% with normal wall motion, grade 1 diastolic dysfunction, trivial MR, normal biatrial size, normal RV function, and dilated IVC.  She was started on Eliquis but was only able to complete 1 month of therapy due to cost.  She also could not afford her HF medications and has not been on anything in the past year.  Admitted to [facility] on 07/20/19 with increased SOB. She  Had been out of her medications for a week. Diuresed with IV lasix and started on HF meds. D/C weight 156 pounds. Given all medications from HF fund at discharge. Referred to HF social work to help w/ meds and transportation to and from appts.   cMRI 5/21 1. Severely dilated left ventricle with normal wall thickness and severely impaired systolic function (LVEF = 29%). Diffuse hypokinesis and paradoxical septal motion. There is midwall late gadolinium enhancement - non-specific sign consistent with non-ischemic cardiomyopathy.Significant trabeculations are seen predominantly in the basal and mid myocardial  segments with ratio of non-compacted to compacted myocardium > 5:1.   She was seen for post hospital follow-up back in 6/21 and was doing fairly well.  She did have a rash on her torso and upper extremities which she felt was from digoxin.  Digoxin was discontinued.  Due to soft BP, we were unable to further titrate her medications. She was instructed to follow-up with pharmacy 2 to 3 weeks later for further med titration however she canceled the appointment and did not reschedule.   Seen in clinic 1/22 with echo EF 40-45%  Seen in ER on 01/21/21 for flu-like symptoms. CT chest with no PE. Mild ground glass. Atypical PNA vs HF. BNP 593. Given IV lasix and abx/inhalers. Flu and covid negative.   Follow up 11/22 she had been out of her medicines x 8 months. Volume up, NYHA II-III. GDMT restarted & enrolled in Paramedicine.  Echo 11/22: EF 30-35%, LV moderately decreased, global HK, RV ok, mild to moderate MR  Today she returns for HF follow up. Having some light-headedness speaking loudly or shouting, but no falls or positional dizziness. Remains fatigued and has good days and bad days. Works FT at [employer], no SOB with job duties. Denies CP, palpitations, abnormal bleeding, edema, or PND/Orthopnea. Appetite ok. No fever or chills. Not weighing at home. Taking all medications. Having more left-sided back pain for which she is taking Tylenol but not helping. This is affecting her sleep.  Recent  BP check at home 96/66.  Cardiac Studies: Echo 11/22: EF 30-35%, LV moderately decreased, global HK, RV ok, mild to moderate MR Echo 04/10/20: EF 40-45% Echo 07/2019: EF 20-25% Grade II DD Echo 03/2018: EF 65-70%   ROS: All systems negative except as listed in HPI, PMH and Problem List.  SH:  Social History   Socioeconomic History   Marital status: Single    Spouse name: Not on file   Number of children: Not on file   Years of education: Not on file   Highest education level: Not on file  Occupational  History   Not on file  Tobacco Use   Smoking status: Former   Smokeless tobacco: Never  Vaping Use   Vaping Use: Never used  Substance and Sexual Activity   Alcohol use: Not Currently   Drug use: Not Currently   Sexual activity: Yes  Other Topics Concern   Not on file  Social History Narrative   Not on file   Social Determinants of Health   Financial Resource Strain: High Risk   Difficulty of Paying Living Expenses: Hard  Food Insecurity: Food Insecurity Present   Worried About Programme researcher, broadcasting/film/video in the Last Year: Sometimes true   Barista in the Last Year: Sometimes true  Transportation Needs: Personal assistant (Medical): Yes   Lack of Transportation (Non-Medical): Yes  Physical Activity: Not on file  Stress: Not on file  Social Connections: Not on file  Intimate Partner Violence: Not on file   FH:  Family History  Problem Relation Age of Onset   Hypertension Mother    Past Medical History:  Diagnosis Date   Acute pulmonary embolism (HCC)    Cardiomyopathy (HCC)    CHF (congestive heart failure) (HCC)    Hypertension    Non-ischemic cardiomyopathy (HCC) 07/2019   Current Outpatient Medications  Medication Sig Dispense Refill   acetaminophen (TYLENOL) 500 MG tablet Take 1,000-1,500 mg by mouth 2 (two) times daily as needed for mild pain or headache.     dapagliflozin propanediol (FARXIGA) 10 MG TABS tablet Take 1 tablet (10 mg total) by mouth daily. 30 tablet 3   furosemide (LASIX) 40 MG tablet Take 1 tablet (40 mg total) by mouth daily. 30 tablet 3   potassium chloride SA (KLOR-CON M) 20 MEQ tablet Take 1 tablet (20 mEq total) by mouth daily. 30 tablet 3   sacubitril-valsartan (ENTRESTO) 24-26 MG Take 1 tablet by mouth 2 (two) times daily. 60 tablet 11   spironolactone (ALDACTONE) 25 MG tablet Take 0.5 tablets (12.5 mg total) by mouth daily. 15 tablet 5   SUMAtriptan (IMITREX) 50 MG tablet Take 50 mg by mouth every 2 (two)  hours as needed for migraine. May repeat in 2 hours if headache persists or recurs.     No current facility-administered medications for this encounter.   BP 90/66    Pulse 71    Wt 72.3 kg (159 lb 6.4 oz)    LMP 03/27/2014 (Approximate)    SpO2 97%    BMI 30.12 kg/m   Wt Readings from Last 3 Encounters:  03/12/21 72.3 kg (159 lb 6.4 oz)  02/17/21 70.9 kg (156 lb 6.4 oz)  01/24/21 75.4 kg (166 lb 3.2 oz)   PHYSICAL EXAM: General:  NAD. No resp difficulty, walked into clinic, fatigued-appearing. HEENT: Normal Neck: Supple. No JVD. Carotids 2+ bilat; no bruits. No lymphadenopathy or thryomegaly appreciated. Cor: PMI nondisplaced. Regular  rate & rhythm. No rubs, gallops or murmurs. Lungs: Clear Abdomen: Soft, nontender, nondistended. No hepatosplenomegaly. No bruits or masses. Good bowel sounds. Extremities: No cyanosis, clubbing, rash, edema Neuro: Alert & oriented x 3, cranial nerves grossly intact. Moves all 4 extremities w/o difficulty. Affect pleasant.  ECG:  SB 59 bpm, lateral TWI (personally reviewed) Reds: 36%  ASSESSMENT & PLAN: 1. 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  She was started on guideline based medications and EF improved to 65-70% on echo in 03/2018.   - Echo 5/21 EF back down to 20-25% (in the setting of poor med compliance) with global hypokinesis, grade II diastolic dysfunction, severely dilated left atrium, moderate MR - cMRI 5/21 EF 29% minimal LGE. + non-compaction. Severe LAE. Mod-severe MR. Normal RV - Echo 04/10/20 EF 40-45%  - Echo 11/22 EF 30-35% - Remains NYHA II-III. Volume status OK today, ReDS 36% - Continue Entresto 24/26 mg bid. Recently decreased due to low BP. - Continue spironolactone 12.5 mg daily - Continue Lasix 40 mg daily + KCL 40 daily. - Continue Farxiga 10 daily. - Off coreg due to hypotension/bradycardia.   - She is off digoxin given previous rash and improvement of EF -  Continue Paramedicine. - Labs today.  2. HTN - BP on low side today. - I asked her to check BP at home and log.  - If sBP continuously <100, will plan to stop Entresto, increase daily Lasix, and start losartan.  3. History of PE - Provoked in the setting of prolonged travel. - Treated w/ Eliquis. No longer on a/c.  4. Non-compliance - Paramedicine has not been able to make home visit, but she endorses medication compliance. - Will engage HFSW to see if she is agreeable to continue paramedicine.  5. Low back pain, w/o sciatica. - Sounds MSK. I am OK with her doing a short trial of NSAIDs (ibuprofen for 1-2 days). - Can try OTC topicals. - Advised follow up with PCP.  Follow up  in 2-3 weeks with APP and will plan to repeat echo with GDMT is optimized.  Jacklynn Ganong, FNP  8:42 AM

## 2021-03-11 ENCOUNTER — Telehealth (HOSPITAL_COMMUNITY): Payer: Self-pay

## 2021-03-11 NOTE — Telephone Encounter (Signed)
Texted Alexandria Rhodes to remind her of her appointment tomorrow in the HF clinic at 0830.

## 2021-03-12 ENCOUNTER — Ambulatory Visit (HOSPITAL_COMMUNITY)
Admission: RE | Admit: 2021-03-12 | Discharge: 2021-03-12 | Disposition: A | Discharge status: Home / Self Care | Payer: Self-pay | Source: Ambulatory Visit | Attending: Family Medicine | Admitting: Family Medicine

## 2021-03-12 ENCOUNTER — Other Ambulatory Visit: Payer: Self-pay

## 2021-03-12 ENCOUNTER — Encounter (HOSPITAL_COMMUNITY): Payer: Self-pay

## 2021-03-12 VITALS — BP 90/66 | HR 71 | Wt 159.4 lb

## 2021-03-12 DIAGNOSIS — I5022 Chronic systolic (congestive) heart failure: Secondary | ICD-10-CM

## 2021-03-12 DIAGNOSIS — Z09 Encounter for follow-up examination after completed treatment for conditions other than malignant neoplasm: Secondary | ICD-10-CM | POA: Insufficient documentation

## 2021-03-12 DIAGNOSIS — M545 Low back pain, unspecified: Secondary | ICD-10-CM | POA: Insufficient documentation

## 2021-03-12 DIAGNOSIS — I10 Essential (primary) hypertension: Secondary | ICD-10-CM

## 2021-03-12 DIAGNOSIS — I5042 Chronic combined systolic (congestive) and diastolic (congestive) heart failure: Secondary | ICD-10-CM | POA: Insufficient documentation

## 2021-03-12 DIAGNOSIS — Z86711 Personal history of pulmonary embolism: Secondary | ICD-10-CM | POA: Insufficient documentation

## 2021-03-12 DIAGNOSIS — Z87891 Personal history of nicotine dependence: Secondary | ICD-10-CM | POA: Insufficient documentation

## 2021-03-12 DIAGNOSIS — G8929 Other chronic pain: Secondary | ICD-10-CM

## 2021-03-12 DIAGNOSIS — Z79899 Other long term (current) drug therapy: Secondary | ICD-10-CM | POA: Insufficient documentation

## 2021-03-12 DIAGNOSIS — Z7984 Long term (current) use of oral hypoglycemic drugs: Secondary | ICD-10-CM | POA: Insufficient documentation

## 2021-03-12 DIAGNOSIS — Z9114 Patient's other noncompliance with medication regimen: Secondary | ICD-10-CM

## 2021-03-12 DIAGNOSIS — I110 Hypertensive heart disease with heart failure: Secondary | ICD-10-CM | POA: Insufficient documentation

## 2021-03-12 DIAGNOSIS — R5383 Other fatigue: Secondary | ICD-10-CM | POA: Insufficient documentation

## 2021-03-12 DIAGNOSIS — Z7901 Long term (current) use of anticoagulants: Secondary | ICD-10-CM | POA: Insufficient documentation

## 2021-03-12 DIAGNOSIS — R42 Dizziness and giddiness: Secondary | ICD-10-CM | POA: Insufficient documentation

## 2021-03-12 LAB — BASIC METABOLIC PANEL
Anion gap: 8 (ref 5–15)
BUN: 19 mg/dL (ref 6–20)
CO2: 25 mmol/L (ref 22–32)
Calcium: 9.4 mg/dL (ref 8.9–10.3)
Chloride: 103 mmol/L (ref 98–111)
Creatinine, Ser: 1.13 mg/dL — ABNORMAL HIGH (ref 0.44–1.00)
GFR, Estimated: >60 mL/min (ref >60)
Glucose, Bld: 82 mg/dL (ref 70–99)
Potassium: 3.9 mmol/L (ref 3.5–5.1)
Sodium: 136 mmol/L (ref 135–145)

## 2021-03-12 LAB — BRAIN NATRIURETIC PEPTIDE: B Natriuretic Peptide: 13.5 pg/mL (ref 0.0–100.0)

## 2021-03-12 NOTE — Progress Notes (Signed)
CSW consulted to speak with pt about disability.  Pt interested in applying- CSW explained that disability for heart failure alone would not be likely as they want applicants to have 20-25% EF to qualify.  Pt expressed understanding- does not have other diagnoses that would be likely to qualify her at this time.  CSW also discussed continued participation in paramedicine program- pt has been unable to be seen as her schedule has not permitted.  Encouraged pt to text paramedic her schedule as soon as she knows it so they can find a time to meet.  Will continue to follow and assist as needed  Burna Sis, LCSW Clinical Social Worker Advanced [facility] Failure Clinic Desk#: [phone] Cell#: [phone]

## 2021-03-12 NOTE — Patient Instructions (Signed)
It was great to see you today! No medication changes are needed at this time.  -ok to try Ibuprofen for 1-2 days to assist with back pain, additional medication recommendations should come from PCP Raliegh Ip, NP) -you can also try OTC BioFreeze or Tiger Balm to affected area as needed  Your physician has requested that you regularly monitor and record your blood pressure readings at home. Please use the same machine at the same time of day to check your readings and record them to bring to your follow-up visit. -please bring a list of reading to your next follow up  Your physician recommends that you schedule a follow-up appointment in: 4 weeks  in the Advanced Practitioners (PA/NP) Clinic    Do the following things EVERYDAY: Weigh yourself in the morning before breakfast. Write it down and keep it in a log. Take your medicines as prescribed Eat low salt foods--Limit salt (sodium) to 2000 mg per day.  Stay as active as you can everyday Limit all fluids for the day to less than 2 liters  At the Advanced [facility] Clinic, you and your health needs are our priority. As part of our continuing mission to provide you with exceptional heart care, we have created designated Provider Care Teams. These Care Teams include your primary Cardiologist (physician) and Advanced Practice Providers (APPs- Physician Assistants and Nurse Practitioners) who all work together to provide you with the care you need, when you need it.   You may see any of the following providers on your designated Care Team at your next follow up: Dr Arvilla Meres Dr Carron Curie, NP [name], [place] [facility] [place], [place] Karle Plumber, PharmD   Please be sure to bring in all your medications bottles to every appointment.   If you have any questions or concerns before your next appointment please send [place] a message through [place] or call our office at [phone].    TO LEAVE A  MESSAGE FOR THE NURSE SELECT OPTION 2, PLEASE LEAVE A MESSAGE INCLUDING: YOUR NAME DATE OF BIRTH CALL BACK NUMBER REASON FOR CALL**this is important as we prioritize the call backs  YOU WILL RECEIVE A CALL BACK THE SAME DAY AS LONG AS YOU CALL BEFORE 4:00 PM

## 2021-03-12 NOTE — Progress Notes (Signed)
ReDS Vest / Clip - 03/12/21 0800       ReDS Vest / Clip   Station Marker A    Ruler Value 27.5    ReDS Value Range Moderate volume overload    ReDS Actual Value 36

## 2021-03-12 NOTE — Addendum Note (Signed)
Encounter addended by: Jacklynn Ganong, FNP on: 03/12/2021 9:58 AM  Actions taken: Clinical Note Signed

## 2021-03-24 ENCOUNTER — Other Ambulatory Visit (HOSPITAL_COMMUNITY): Payer: Self-pay

## 2021-03-24 NOTE — Progress Notes (Signed)
Paramedicine Encounter    Patient ID: Alexandria Rhodes, female    DOB: 04-19-1975, 46 y.o.   MRN: [id number]  Arrived for initial paramedicine visit with Cynitha who was alert and oriented reporting to be feeling fine. She was ambulating around the apartment with no shortness of breath or trouble walking noted.  She reports she has a good knowledge of her medical conditions and what medications she takes. We reviewed her medications and she was able to tell me what she takes why and when. All pill bottles are empty except Spironolactone. She reports she just ran out this morning. She states she has been taking them correctly. I contacted [facility] who reports they have medications ready for pick up.  Vitals and assessment obtained. No swelling noted in legs, abdomen or neck. Lungs clear. Vitals within normal range.   We reviewed upcoming appointments and confirmed same. I set up transportation for her:   Ride ID:  [id number]  Transportation Type:  RIDESHARE (UBER/LYFT)  Pickup Date/Time:  04/09/21 at 9:15 AM (EST)  [facility] Address  [address] [place], [place] [address], [place] Drop-off Address  [address] [place], [place] [address], [place]    I plan to see Kileah in two weeks as she works and her schedule is difficult per patient.   I went to retrieve Entresto Samples at HF clinic and medications from [facility] and delivered these back to patient.   She does not want to use a pill box at this time.   Home visit complete. I will see Marianne in two weeks.   Patient Care Team: Azzie Glatter, FNP as PCP - General (Family Medicine)  Patient Active Problem List   Diagnosis Date Noted   Acute exacerbation of CHF (congestive heart failure) ([place]) 07/20/2019   History of pulmonary embolism 07/20/2019   Hypokalemia 07/20/2019   Normocytic anemia 07/20/2019   Urinary tract infection with hematuria 04/06/2018   Cough 04/06/2018   Acute pulmonary embolism ([place])  0000000   Chronic systolic CHF (congestive heart failure) ([place]) 03/27/2018   HTN (hypertension) 03/27/2018    Current Outpatient Medications:    acetaminophen (TYLENOL) 500 MG tablet, Take 1,000-1,500 mg by mouth 2 (two) times daily as needed for mild pain or headache., Disp: , Rfl:    dapagliflozin propanediol (FARXIGA) 10 MG TABS tablet, Take 1 tablet (10 mg total) by mouth daily., Disp: 30 tablet, Rfl: 3   furosemide (LASIX) 40 MG tablet, Take 1 tablet (40 mg total) by mouth daily., Disp: 30 tablet, Rfl: 3   potassium chloride SA (KLOR-CON M) 20 MEQ tablet, Take 1 tablet (20 mEq total) by mouth daily., Disp: 30 tablet, Rfl: 3   sacubitril-valsartan (ENTRESTO) 24-26 MG, Take 1 tablet by mouth 2 (two) times daily., Disp: 60 tablet, Rfl: 11   spironolactone (ALDACTONE) 25 MG tablet, Take 0.5 tablets (12.5 mg total) by mouth daily., Disp: 15 tablet, Rfl: 5   SUMAtriptan (IMITREX) 50 MG tablet, Take 50 mg by mouth every 2 (two) hours as needed for migraine. May repeat in 2 hours if headache persists or recurs., Disp: , Rfl:  Allergies  Allergen Reactions   Shellfish Allergy Anaphylaxis and Hives     Social History   Socioeconomic History   Marital status: Single    Spouse name: Not on file   Number of children: Not on file   Years of education: Not on file   Highest education level: Not on file  Occupational History   Not on file  Tobacco Use   Smoking status: Former   Smokeless tobacco: Never  Scientific laboratory technician Use: Never used  Substance and Sexual Activity   Alcohol use: Not Currently   Drug use: Not Currently   Sexual activity: Yes  Other Topics Concern   Not on file  Social History Narrative   Not on file   Social Determinants of Health   Financial Resource Strain: High Risk   Difficulty of Paying Living Expenses: Hard  Food Insecurity: Food Insecurity Present   Worried About Running Out of Food in the Last Year: Sometimes true   Ran Out of Food in the Last  Year: Sometimes true  Transportation Needs: Unmet Transportation Needs   Lack of Transportation (Medical): Yes   Lack of Transportation (Non-Medical): Yes  Physical Activity: Not on file  Stress: Not on file  Social Connections: Not on file  Intimate Partner Violence: Not on file    Physical Exam Vitals reviewed.  Constitutional:      Appearance: Normal appearance. She is normal weight.  HENT:     Head: Normocephalic.     Nose: Nose normal.     Mouth/Throat:     Mouth: Mucous membranes are moist.     Pharynx: Oropharynx is clear.  Eyes:     Conjunctiva/sclera: Conjunctivae normal.     Pupils: Pupils are equal, round, and reactive to light.  Cardiovascular:     Rate and Rhythm: Normal rate and regular rhythm.     Pulses: Normal pulses.     Heart sounds: Normal heart sounds.  Pulmonary:     Effort: Pulmonary effort is normal.     Breath sounds: Normal breath sounds.  Abdominal:     General: Abdomen is flat.     Palpations: Abdomen is soft.  Musculoskeletal:        General: No swelling. Normal range of motion.     Cervical back: Normal range of motion.     Right lower leg: No edema.     Left lower leg: No edema.  Skin:    General: Skin is warm and dry.     Capillary Refill: Capillary refill takes less than 2 seconds.  Neurological:     General: No focal deficit present.     Mental Status: She is alert. Mental status is at baseline.  Psychiatric:        Mood and Affect: Mood normal.        Future Appointments  Date Time Provider [place]  04/09/2021 10:00 AM [facility] PA/NP [facility] None     ACTION: Home visit completed

## 2021-03-31 ENCOUNTER — Other Ambulatory Visit (HOSPITAL_COMMUNITY): Payer: Self-pay

## 2021-03-31 NOTE — Progress Notes (Signed)
Paramedicine Encounter    Patient ID: Alexandria Rhodes, female    DOB: 05-20-1975, 46 y.o.   MRN: [id number]  Picked up Spironolactone from [facility] for Alexandria Rhodes and delivered same to her home. We will follow up next week for full paramedicine visit.    ACTION: Home visit completed

## 2021-04-08 ENCOUNTER — Telehealth (HOSPITAL_COMMUNITY): Payer: Self-pay

## 2021-04-08 NOTE — Progress Notes (Signed)
ADVANCED HEART FAILURE CLINIC NOTE  PCP: Raliegh Ip FNP  HF Cardiologist: Dr Gala Romney   Reason for visit: Heart Failure   HPI: Alexandria Rhodes is a 46 y.o. female with a history of non-ischemic cardiomyopathy with EF as low as 10% in 2016 (had LifeVest) but EF 40-45% on Echo in 1/22, prior PE in 03/2018 no longer on anticoagulation, migraine, and hypertension.  She was admitted to a hospital in [place] in 08/2014 after presenting with CP and SOB.  She was found to have an EF of 10%. She states she underwent heart cath which showed no CAD. She was followed by [facility]. Ant[facility] [place].  She was started on GDMT and was on Entresto 97-103 mg bid,  Coreg 6.25 mg bid and Spironolactone 12.5 mg daily.  She moved to [place] at the end of 2019.  Admitted to [facility] in 03/2018 with CP and found to have an acute PE felt to be provoked by recent move from [place]. Echo showed LVEF of 65-70% with normal wall motion, grade 1 DD, normal RV function, and dilated IVC.  She was started on Eliquis but was only able to complete 1 month of therapy due to cost.  She also could not afford her HF medications and has not been on anything x 1 year.  Admitted to [facility] on 5/21 with A/C CHF,  had been out of her medications for a week. Diuresed with IV lasix and started on HF meds. D/C weight 156 pounds. Given all medications from HF fund at discharge. Referred to HF social work to help w/ meds and transportation to and from appts.   cMRI 5/21 LVEF 29%, diffuse HK, paradoxical septal motion, midwall LGE, non-specific sign consistent with NICM, significant trabeculations in basal and mid myocardial segments.  08/2019 digoxin stopped due to rash and improved symptoms. She canceled all follow up appointments and did not reschedule.   Seen in clinic 1/22 with echo EF 40-45%.  Seen in ER on 01/21/21 for flu-like symptoms. CT chest with no PE. Mild ground glass. Atypical PNA vs HF. BNP 593.  Given IV lasix and abx/inhalers. Flu and covid negative.   Follow up 11/22 she had been out of her medicines x 8 months. Volume up, NYHA II-III. GDMT restarted & enrolled in Paramedicine.  Echo 11/22: EF 30-35%, LV moderately decreased, global HK, RV ok, mild to moderate MR  Today she returns for HF follow up. She is followed by paramedicine. GDMT scaled back recently due to low BPs. Overall feeling OK. She had an episode of light headedness at work yesterday, no fall but this worried her. Works full time at [employer]. Denies palpitations, abnormal bleeding, CP, GU symptoms, edema, or PND/Orthopnea. Appetite ok. No fever or chills. She is not weighing at home regularly. Taking all medications. BP at home better, systolic > 100 now. Continues with low back pain.  Cardiac Studies: - Echo 11/22: EF 30-35%, LV moderately decreased, global HK, RV ok, mild to moderate MR  - Echo 04/10/20: EF 40-45%  - Echo 07/2019: EF 20-25% Grade II DD  - cMRI 07/2019: LVEF 29%, diffuse HK, paradoxical septal motion, midwall LGE, non-specific sign consistent with NICM, significant trabeculations in basal and mid myocardial segments.  - Echo 03/2018: EF 65-70%   ROS: All systems negative except as listed in HPI, PMH and Problem List.  SH:  Social History   Socioeconomic History   Marital status: Single    Spouse name:  Not on file   Number of children: Not on file   Years of education: Not on file   Highest education level: Not on file  Occupational History   Not on file  Tobacco Use   Smoking status: Former   Smokeless tobacco: Never  Vaping Use   Vaping Use: Never used  Substance and Sexual Activity   Alcohol use: Not Currently   Drug use: Not Currently   Sexual activity: Yes  Other Topics Concern   Not on file  Social History Narrative   Not on file   Social Determinants of Health   Financial Resource Strain: High Risk   Difficulty of Paying Living Expenses: Hard  Food Insecurity: Food Insecurity  Present   Worried About Running Out of Food in the Last Year: Sometimes true   Ran Out of Food in the Last Year: Sometimes true  Transportation Needs: Personal assistant (Medical): Yes   Lack of Transportation (Non-Medical): Yes  Physical Activity: Not on file  Stress: Not on file  Social Connections: Not on file  Intimate Partner Violence: Not on file   FH:  Family History  Problem Relation Age of Onset   Hypertension Mother    Past Medical History:  Diagnosis Date   Acute pulmonary embolism (HCC)    Cardiomyopathy (HCC)    CHF (congestive heart failure) (HCC)    Hypertension    Non-ischemic cardiomyopathy (HCC) 07/2019   Current Outpatient Medications  Medication Sig Dispense Refill   acetaminophen (TYLENOL) 500 MG tablet Take 1,000-1,500 mg by mouth 2 (two) times daily as needed for mild pain or headache.     dapagliflozin propanediol (FARXIGA) 10 MG TABS tablet Take 1 tablet (10 mg total) by mouth daily. 30 tablet 3   furosemide (LASIX) 40 MG tablet Take 1 tablet (40 mg total) by mouth daily. 30 tablet 3   potassium chloride SA (KLOR-CON M) 20 MEQ tablet Take 1 tablet (20 mEq total) by mouth daily. 30 tablet 3   sacubitril-valsartan (ENTRESTO) 24-26 MG Take 1 tablet by mouth 2 (two) times daily. 60 tablet 11   spironolactone (ALDACTONE) 25 MG tablet Take 0.5 tablets (12.5 mg total) by mouth daily. 15 tablet 5   SUMAtriptan (IMITREX) 50 MG tablet Take 50 mg by mouth every 2 (two) hours as needed for migraine. May repeat in 2 hours if headache persists or recurs.     No current facility-administered medications for this encounter.   BP 112/78    Pulse (!) 55    Wt 72.8 kg (160 lb 6.4 oz)    LMP 03/27/2014 (Approximate)    SpO2 96%    BMI 30.31 kg/m   Wt Readings from Last 3 Encounters:  04/09/21 72.8 kg (160 lb 6.4 oz)  03/24/21 72.6 kg (160 lb)  03/12/21 72.3 kg (159 lb 6.4 oz)   PHYSICAL EXAM: General:  NAD. No resp difficulty,  fatigued-appearing. HEENT: Normal Neck: Supple. No JVD. Carotids 2+ bilat; no bruits. No lymphadenopathy or thryomegaly appreciated. Cor: PMI nondisplaced. Regular rate & rhythm. No rubs, gallops or murmurs. Lungs: Clear Abdomen: Soft, nontender, nondistended. No hepatosplenomegaly. No bruits or masses. Good bowel sounds. Extremities: No cyanosis, clubbing, rash, edema Neuro: Alert & oriented x 3, cranial nerves grossly intact. Moves all 4 extremities w/o difficulty. Affect pleasant.  ECG: SB 45 bpm, 1st degree AVB PR 212 msec  ReDs: 34%  ASSESSMENT & PLAN: 1. 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  She was started on guideline based medications and EF improved to 65-70% on echo in 03/2018.   - Echo 5/21 EF back down to 20-25% (in the setting of poor med compliance) with global hypokinesis, grade II diastolic dysfunction, severely dilated left atrium, moderate MR - cMRI 5/21 EF 29% minimal LGE. + non-compaction. Severe LAE. Mod-severe MR. Normal RV - Echo 04/10/20 EF 40-45%  - Echo 11/22 EF 30-35% - NYHA II. Volume status OK today, ReDS 34% - Continue Entresto 24/26 mg bid. Recently decreased due to low BP. - Continue spironolactone 12.5 mg daily. Will not increase today with light-headedness spell. - Continue Lasix 40 mg daily + KCL 20 daily. - Continue Farxiga 10 daily. - Off coreg due to hypotension/bradycardia. HR 55 today.  - She is off digoxin given previous rash and improvement of EF. Will not re-challenge today with HR 55. - Continue Paramedicine. - BMET today.  2. Pre-syncope/light headedness - ?orthostasis by her description - She is SB 45 on ECG today. Off beta blocker.  - Place Zio AT to quantify arrhythmias/high-grade AVB. - Check CBC and iron panel.  3. HTN - BP well-controlled today. - Continue to check BP at home, notify clinic if consistently low.  4. History of PE - Provoked in the setting of  prolonged travel. - Treated w/ Eliquis. No longer on a/c.  5. Non-compliance - Doing better with medication compliance. - Continue paramedicine  6. Low back pain, w/o sciatica. - May benefit from PT, although now sure her schedule will allow. - Advised follow up with PCP. I asked her to call her PCP today to work this up.  Follow up in 2-3 months with Dr. Gala Romney + echo.  Jacklynn Ganong, FNP  9:37 AM

## 2021-04-08 NOTE — Telephone Encounter (Signed)
Left message for Ms. Rask reminding her of her clinic appointment tomorrow and ride pickup time.   I advised her of my absence tomorrow and I will follow up next week. Call complete.

## 2021-04-09 ENCOUNTER — Ambulatory Visit (HOSPITAL_COMMUNITY)
Admission: RE | Admit: 2021-04-09 | Discharge: 2021-04-09 | Disposition: A | Discharge status: Home / Self Care | Payer: Self-pay | Source: Ambulatory Visit | Attending: Family Medicine | Admitting: Family Medicine

## 2021-04-09 ENCOUNTER — Other Ambulatory Visit: Payer: Self-pay

## 2021-04-09 ENCOUNTER — Other Ambulatory Visit (HOSPITAL_COMMUNITY): Payer: Self-pay | Admitting: Internal Medicine

## 2021-04-09 ENCOUNTER — Encounter (HOSPITAL_COMMUNITY): Payer: Self-pay

## 2021-04-09 ENCOUNTER — Ambulatory Visit (HOSPITAL_COMMUNITY)
Admission: RE | Admit: 2021-04-09 | Discharge: 2021-04-09 | Disposition: A | Discharge status: Home / Self Care | Payer: Self-pay | Source: Ambulatory Visit | Attending: Internal Medicine | Admitting: Internal Medicine

## 2021-04-09 VITALS — BP 112/78 | HR 55 | Wt 160.4 lb

## 2021-04-09 DIAGNOSIS — I428 Other cardiomyopathies: Secondary | ICD-10-CM | POA: Insufficient documentation

## 2021-04-09 DIAGNOSIS — Z7901 Long term (current) use of anticoagulants: Secondary | ICD-10-CM | POA: Insufficient documentation

## 2021-04-09 DIAGNOSIS — R55 Syncope and collapse: Secondary | ICD-10-CM

## 2021-04-09 DIAGNOSIS — M549 Dorsalgia, unspecified: Secondary | ICD-10-CM | POA: Insufficient documentation

## 2021-04-09 DIAGNOSIS — R42 Dizziness and giddiness: Secondary | ICD-10-CM | POA: Insufficient documentation

## 2021-04-09 DIAGNOSIS — I110 Hypertensive heart disease with heart failure: Secondary | ICD-10-CM | POA: Insufficient documentation

## 2021-04-09 DIAGNOSIS — M545 Low back pain, unspecified: Secondary | ICD-10-CM

## 2021-04-09 DIAGNOSIS — G8929 Other chronic pain: Secondary | ICD-10-CM

## 2021-04-09 DIAGNOSIS — I5022 Chronic systolic (congestive) heart failure: Secondary | ICD-10-CM

## 2021-04-09 DIAGNOSIS — I498 Other specified cardiac arrhythmias: Secondary | ICD-10-CM | POA: Insufficient documentation

## 2021-04-09 DIAGNOSIS — Z86711 Personal history of pulmonary embolism: Secondary | ICD-10-CM | POA: Insufficient documentation

## 2021-04-09 DIAGNOSIS — Z8249 Family history of ischemic heart disease and other diseases of the circulatory system: Secondary | ICD-10-CM | POA: Insufficient documentation

## 2021-04-09 DIAGNOSIS — I10 Essential (primary) hypertension: Secondary | ICD-10-CM

## 2021-04-09 DIAGNOSIS — Z9114 Patient's other noncompliance with medication regimen: Secondary | ICD-10-CM

## 2021-04-09 DIAGNOSIS — I5042 Chronic combined systolic (congestive) and diastolic (congestive) heart failure: Secondary | ICD-10-CM | POA: Insufficient documentation

## 2021-04-09 LAB — BASIC METABOLIC PANEL
Anion gap: 9 (ref 5–15)
BUN: 14 mg/dL (ref 6–20)
CO2: 23 mmol/L (ref 22–32)
Calcium: 9.3 mg/dL (ref 8.9–10.3)
Chloride: 105 mmol/L (ref 98–111)
Creatinine, Ser: 0.97 mg/dL (ref 0.44–1.00)
GFR, Estimated: >60 mL/min (ref >60)
Glucose, Bld: 75 mg/dL (ref 70–99)
Potassium: 4.2 mmol/L (ref 3.5–5.1)
Sodium: 137 mmol/L (ref 135–145)

## 2021-04-09 LAB — CBC
HCT: 42.3 % (ref 36.0–46.0)
Hemoglobin: 13.3 g/dL (ref 12.0–15.0)
MCH: 28.6 pg (ref 26.0–34.0)
MCHC: 31.4 g/dL (ref 30.0–36.0)
MCV: 91 fL (ref 80.0–100.0)
Platelets: 292 10*3/uL (ref 150–400)
RBC: 4.65 MIL/uL (ref 3.87–5.11)
RDW: 14.8 % (ref 11.5–15.5)
WBC: 4.1 10*3/uL (ref 4.0–10.5)
nRBC: 0 % (ref 0.0–0.2)

## 2021-04-09 LAB — IRON AND TIBC
Iron: 87 ug/dL (ref 28–170)
Saturation Ratios: 23 % (ref 10.4–31.8)
TIBC: 375 ug/dL (ref 250–450)
UIBC: 288 ug/dL

## 2021-04-09 LAB — FERRITIN: Ferritin: 18 ng/mL (ref 11–307)

## 2021-04-09 NOTE — Progress Notes (Signed)
ReDS Vest / Clip - 04/09/21 0900       ReDS Vest / Clip   Station Marker A    Ruler Value 26.5    ReDS Value Range Low volume    ReDS Actual Value 34

## 2021-04-09 NOTE — Addendum Note (Signed)
Encounter addended by: Theresia Bough, CMA on: 04/09/2021 10:21 AM  Actions taken: Clinical Note Signed, Charge Capture section accepted

## 2021-04-09 NOTE — Patient Instructions (Signed)
It was great to see you today! No medication changes are needed at this time.  Labs today We will only contact you if something comes back abnormal or we need to make some changes. Otherwise no news is good news!  Your provider has recommended that  you wear a Zio Patch for 14 days.  This monitor will record your heart rhythm for our review.  IF you have any symptoms while wearing the monitor please press the button.  If you have any issues with the patch or you notice a red or orange light on it please call the company at [phone].  Once you remove the patch please mail it back to the company as soon as possible so we can get the results.   Your physician recommends that you schedule a follow-up appointment in: 3 months with Dr Gala Romney and echo  Your physician has requested that you have an echocardiogram. Echocardiography is a painless test that uses sound waves to create images of your heart. It provides your doctor with information about the size and shape of your heart and how well your hearts chambers and valves are working. This procedure takes approximately one hour. There are no restrictions for this procedure.   Do the following things EVERYDAY: Weigh yourself in the morning before breakfast. Write it down and keep it in a log. Take your medicines as prescribed Eat low salt foods--Limit salt (sodium) to 2000 mg per day.  Stay as active as you can everyday Limit all fluids for the day to less than 2 liters  At the [facility], you and your health needs are our priority. As part of our continuing mission to provide you with exceptional heart care, we have created designated Provider Care Teams. These Care Teams include your primary Cardiologist (physician) and Advanced Practice Providers (APPs- Physician Assistants and Nurse Practitioners) who all work together to provide you with the care you need, when you need it.   You may see any of the following  providers on your designated Care Team at your next follow up: Dr Arvilla Meres Dr Carron Curie, NP [name], [place] [facility] [place], [place] Karle Plumber, PharmD   Please be sure to bring in all your medications bottles to every appointment.

## 2021-04-16 ENCOUNTER — Telehealth (HOSPITAL_COMMUNITY): Payer: Self-pay

## 2021-04-16 NOTE — Telephone Encounter (Signed)
Spoke to [place] to set up home visit and she reports she is working today and will be unable to meet today. She agreed to home visit on Monday morning.   I will see her on Monday.

## 2021-04-21 ENCOUNTER — Other Ambulatory Visit (HOSPITAL_COMMUNITY): Payer: Self-pay

## 2021-04-21 NOTE — Progress Notes (Signed)
Paramedicine Encounter    Patient ID: Alexandria Rhodes, female    DOB: February 05, 1976, 46 y.o.   MRN: [id number]   Arrived for home visit for [facility] who reports feeling good with no complaints other than lower back pain. She denied chest pain, dizziness, shortness of breath.   Vitals and assessment obtained. Jee reports she [facility] and is needing Entresto samples. She filled her own pill box today. I will obtain medications and samples and deliver this week.   Kerrigan and I looked at her ZIO patch that needed re-enforcement. I did same with tegaderm and tape.   Sofie reminded of upcoming appointments.     Patient Care Team: Azzie Glatter, FNP as PCP - General (Family Medicine)  Patient Active Problem List   Diagnosis Date Noted   Acute exacerbation of CHF (congestive heart failure) ([place]) 07/20/2019   History of pulmonary embolism 07/20/2019   Hypokalemia 07/20/2019   Normocytic anemia 07/20/2019   Urinary tract infection with hematuria 04/06/2018   Cough 04/06/2018   Acute pulmonary embolism ([place]) 0000000   Chronic systolic CHF (congestive heart failure) ([place]) 03/27/2018   HTN (hypertension) 03/27/2018    Current Outpatient Medications:    acetaminophen (TYLENOL) 500 MG tablet, Take 1,000-1,500 mg by mouth 2 (two) times daily as needed for mild pain or headache., Disp: , Rfl:    dapagliflozin propanediol (FARXIGA) 10 MG TABS tablet, Take 1 tablet (10 mg total) by mouth daily., Disp: 30 tablet, Rfl: 3   furosemide (LASIX) 40 MG tablet, Take 1 tablet (40 mg total) by mouth daily., Disp: 30 tablet, Rfl: 3   potassium chloride SA (KLOR-CON M) 20 MEQ tablet, Take 1 tablet (20 mEq total) by mouth daily., Disp: 30 tablet, Rfl: 3   sacubitril-valsartan (ENTRESTO) 24-26 MG, Take 1 tablet by mouth 2 (two) times daily., Disp: 60 tablet, Rfl: 11   spironolactone (ALDACTONE) 25 MG tablet, Take 0.5 tablets (12.5 mg total) by mouth daily., Disp: 15  tablet, Rfl: 5   SUMAtriptan (IMITREX) 50 MG tablet, Take 50 mg by mouth every 2 (two) hours as needed for migraine. May repeat in 2 hours if headache persists or recurs., Disp: , Rfl:  Allergies  Allergen Reactions   Shellfish Allergy Anaphylaxis and Hives     Social History   Socioeconomic History   Marital status: Single    Spouse name: Not on file   Number of children: Not on file   Years of education: Not on file   Highest education level: Not on file  Occupational History   Not on file  Tobacco Use   Smoking status: Former   Smokeless tobacco: Never  Vaping Use   Vaping Use: Never used  Substance and Sexual Activity   Alcohol use: Not Currently   Drug use: Not Currently   Sexual activity: Yes  Other Topics Concern   Not on file  Social History Narrative   Not on file   Social Determinants of Health   Financial Resource Strain: High Risk   Difficulty of Paying Living Expenses: Hard  Food Insecurity: Food Insecurity Present   Worried About Running Out of Food in the Last Year: Sometimes true   Ran Out of Food in the Last Year: Sometimes true  Transportation Needs: Unmet Transportation Needs   Lack of Transportation (Medical): Yes   Lack of Transportation (Non-Medical): Yes  Physical Activity: Not on file  Stress: Not on file  Social Connections: Not on file  Intimate Partner Violence: Not on file    Physical Exam Vitals reviewed.  Constitutional:      Appearance: Normal appearance. She is normal weight.  HENT:     Head: Normocephalic.     Nose: Nose normal.     Mouth/Throat:     Mouth: Mucous membranes are moist.     Pharynx: Oropharynx is clear.  Eyes:     Conjunctiva/sclera: Conjunctivae normal.     Pupils: Pupils are equal, round, and reactive to light.  Cardiovascular:     Rate and Rhythm: Normal rate and regular rhythm.     Pulses: Normal pulses.     Heart sounds: Normal heart sounds.  Pulmonary:     Effort: Pulmonary effort is normal.      Breath sounds: Normal breath sounds.  Abdominal:     General: Abdomen is flat.     Palpations: Abdomen is soft.  Musculoskeletal:        General: No swelling. Normal range of motion.     Cervical back: Normal range of motion.     Right lower leg: No edema.     Left lower leg: No edema.  Skin:    General: Skin is warm and dry.     Capillary Refill: Capillary refill takes less than 2 seconds.  Neurological:     General: No focal deficit present.     Mental Status: She is alert. Mental status is at baseline.  Psychiatric:        Mood and Affect: Mood normal.        Future Appointments  Date Time Provider [place]  07/11/2021  9:00 AM [facility] ECHO OP 1 [facility]  07/11/2021 10:00 AM Bensimhon, Shaune Pascal, MD [facility] None     ACTION: Home visit completed

## 2021-04-22 ENCOUNTER — Other Ambulatory Visit (HOSPITAL_COMMUNITY): Payer: Self-pay

## 2021-04-22 ENCOUNTER — Telehealth (HOSPITAL_COMMUNITY): Payer: Self-pay | Admitting: Licensed Clinical Social Worker

## 2021-04-22 NOTE — Telephone Encounter (Signed)
Paramedic requesting samples of Entresto for patient.  Per notes pt was working with pharmacy team to get enrolled with [employer] but pt needed to submit income statement- informed paramedic to get tax return from pt so we can proceed with pt assistance.  2 bottles of entresto provided for pt to hold her over why she works on this.  Jorge Ny, LCSW Clinical Social Worker [facility] Desk#: [phone] Cell#: [phone]

## 2021-05-06 ENCOUNTER — Telehealth (HOSPITAL_COMMUNITY): Payer: Self-pay

## 2021-05-06 NOTE — Telephone Encounter (Signed)
Spoke to [place] to inquire about home paramedicine visit, she reports she is doing fine and does not require a visit this week. She says she has all meds and is doing well. Call complete. I will continue to follow up.

## 2021-05-15 ENCOUNTER — Telehealth (HOSPITAL_COMMUNITY): Payer: Self-pay

## 2021-05-15 NOTE — Telephone Encounter (Signed)
Left message for [place] to text me back in regards to paramedicine home visit. Plans for graduation are being discussed at this time. I will continue to follow. ?

## 2021-06-05 ENCOUNTER — Telehealth (HOSPITAL_COMMUNITY): Payer: Self-pay

## 2021-06-05 ENCOUNTER — Other Ambulatory Visit (HOSPITAL_COMMUNITY): Payer: Self-pay

## 2021-06-05 ENCOUNTER — Other Ambulatory Visit (HOSPITAL_COMMUNITY): Payer: Self-pay | Admitting: Internal Medicine

## 2021-06-05 DIAGNOSIS — I5022 Chronic systolic (congestive) heart failure: Secondary | ICD-10-CM

## 2021-06-05 MED ORDER — FUROSEMIDE 40 MG PO TABS
40.0000 mg | ORAL_TABLET | Freq: Every day | ORAL | 3 refills | Status: DC
  Filled 2021-06-05 – 2021-06-17 (×2): qty 30, 30d supply, fill #0
  Filled 2021-10-20: qty 30, 30d supply, fill #1
  Filled 2021-12-12: qty 30, 30d supply, fill #2
  Filled 2022-02-03: qty 30, 30d supply, fill #3

## 2021-06-05 MED ORDER — DAPAGLIFLOZIN PROPANEDIOL 10 MG PO TABS
10.0000 mg | ORAL_TABLET | Freq: Every day | ORAL | 3 refills | Status: DC
  Filled 2021-06-05 – 2021-06-17 (×2): qty 30, 30d supply, fill #0

## 2021-06-05 MED ORDER — POTASSIUM CHLORIDE CRYS ER 20 MEQ PO TBCR
20.0000 meq | EXTENDED_RELEASE_TABLET | Freq: Every day | ORAL | 3 refills | Status: DC
  Filled 2021-06-05 – 2021-06-17 (×2): qty 30, 30d supply, fill #0
  Filled 2021-10-20: qty 30, 30d supply, fill #1
  Filled 2021-12-12: qty 30, 30d supply, fill #2
  Filled 2022-02-03: qty 30, 30d supply, fill #3

## 2021-06-05 NOTE — Telephone Encounter (Signed)
Spoke to Alexandria Rhodes today who reports she is doing well, has all medications and is taking them as prescribed filling her own pill box in which I have witnessed and checked for accuracy. She is aware of appointments and has transportation to get to same. She has housing but is living with her aunt. Currently still working at [employer]. She has no food insecurities at this time. She denied needing any further services from paramedicine. I will forward to LCSW and NP in HF clinic for discharge notification from paramedicine program. Alexandria Rhodes agrees and states she will reach out to HF clinic if a need arises. Call complete.  ?

## 2021-06-13 ENCOUNTER — Telehealth (HOSPITAL_COMMUNITY): Payer: Self-pay | Admitting: Licensed Clinical Social Worker

## 2021-06-13 ENCOUNTER — Other Ambulatory Visit (HOSPITAL_COMMUNITY): Payer: Self-pay

## 2021-06-13 NOTE — Progress Notes (Signed)
?Heart and Vascular Care Navigation ? ?06/13/2021 ? ?Alexandria Rhodes ?May 17, 1975 ?[id number] ? ?Reason for Referral: Pt called to inquire about monetary assistance with moving into an apartment.  ?  ?Engaged with patient by telephone for follow up visit for Heart and Vascular Care Coordination. ?                                                                                                  ?Assessment:   Pt reports she has found an apartment but is unable to cover both the first months rent ($287) and security deposit ($400).  Pt is working regularly and sees no barriers to continuing to pay rent moving forward- anticipates she could cover the security deposit if she could get help with the first months rent. ? ?Pt provided all required documents to apply for Patient Care Fund assistance and check request submitted to supervisor for approval. ? ?Pt also mentioned need for furniture and lack of funding to get.  CSW discussed referral to [facility] network for assistance and will plan to get back with CSW to complete.                                 ? ?HRT/VAS Care Coordination   ? ? Patients Home Cardiology Office Heart Failure Clinic  ? Outpatient Care Team Community Paramedicine  ? Community Paramedic Name: graduated 06/05/21  ? Social Worker Name: Lasandra Beech, [place] [phone]  ? Living arrangements for the past 2 months Apartment  ? Lives with: Siblings  ? Patient Current Insurance Coverage Self-Pay  ? Patient Has Concern With Paying Medical Bills Yes  ? Medical Bill Referrals: Referral to [facility] Financial Counseling  ? Does Patient Have Prescription Coverage? No  ? Patient Prescription Assistance Programs Heart Failure Fund; Patient Assistance Programs  ? Patient Assistance Programs Medications [employer] for [employer]- expires 08/01/20  ? Home Assistive Devices/Equipment Scales  ? DME Agency NA  ? HH Agency NA  ? ?  ? ? ?Social History:                                                                             ?SDOH  Screenings  ? ?Alcohol Screen: Not on file  ?Depression (PHQ2-9): Not on file  ?Financial Resource Strain: High Risk  ? Difficulty of Paying Living Expenses: Very hard  ?Food Insecurity: Food Insecurity Present  ? Worried About Programme researcher, broadcasting/film/video in the Last Year: Sometimes true  ? Ran Out of Food in the Last Year: Sometimes true  ?Housing: Low Risk   ? Last Housing Risk Score: 0  ?Physical Activity: Not on file  ?Social Connections: Not on file  ?Stress: Not on file  ?Tobacco Use: Medium Risk  ? Smoking Tobacco Use:  Former  ? Smokeless Tobacco Use: Never  ? Passive Exposure: Not on file  ?Transportation Needs: Unmet Transportation Needs  ? Lack of Transportation (Medical): Yes  ? Lack of Transportation (Non-Medical): Yes  ? ? ?SDOH Interventions: ?Financial Resources:  Financial Strain Interventions: Other (Comment) (Patient Care Fund) ?Works full time at [employer]  ?Food Insecurity:  None reported  ?Housing Insecurity:  Getting into new housing  ?Transportation:   None reported  ? ? ?Follow-up plan:   ? ?CSW to complete check request.  Pt to follow up with CSW to complete [facility] referral. ? ?Burna Sis, LCSW ?Clinical Social Worker ?[facility] ?Desk#: [phone] ?Cell#: [phone] ? ? ? ?

## 2021-06-16 ENCOUNTER — Telehealth (HOSPITAL_COMMUNITY): Payer: Self-pay | Admitting: Licensed Clinical Social Worker

## 2021-06-16 NOTE — Telephone Encounter (Signed)
Referral sent in to [facility] network to assist with getting furniture.  Will await approval so we can move forward with paying for referral fee ? ?Will continue to follow and assist as needed ? ?Burna Sis, LCSW ?Clinical Social Worker ?[facility] ?Desk#: [phone] ?Cell#: [phone] ? ?

## 2021-06-17 ENCOUNTER — Other Ambulatory Visit (HOSPITAL_COMMUNITY): Payer: Self-pay

## 2021-07-08 ENCOUNTER — Telehealth (HOSPITAL_COMMUNITY): Payer: Self-pay | Admitting: *Deleted

## 2021-07-08 NOTE — Telephone Encounter (Signed)
Pt called to request transportation for appt Friday. I called pt to discuss options no answer/left vm for return call.  ?

## 2021-07-11 ENCOUNTER — Encounter (HOSPITAL_COMMUNITY): Payer: Self-pay | Admitting: Internal Medicine

## 2021-07-11 ENCOUNTER — Ambulatory Visit (HOSPITAL_BASED_OUTPATIENT_CLINIC_OR_DEPARTMENT_OTHER)
Admission: RE | Admit: 2021-07-11 | Discharge: 2021-07-11 | Disposition: A | Discharge status: Home / Self Care | Payer: Self-pay | Source: Ambulatory Visit | Attending: Internal Medicine | Admitting: Internal Medicine

## 2021-07-11 ENCOUNTER — Other Ambulatory Visit (HOSPITAL_COMMUNITY): Payer: Self-pay

## 2021-07-11 ENCOUNTER — Ambulatory Visit (HOSPITAL_COMMUNITY)
Admission: RE | Admit: 2021-07-11 | Discharge: 2021-07-11 | Disposition: A | Discharge status: Home / Self Care | Payer: Self-pay | Source: Ambulatory Visit | Attending: Internal Medicine | Admitting: Internal Medicine

## 2021-07-11 VITALS — BP 104/70 | HR 41 | Ht 61.0 in | Wt 168.2 lb

## 2021-07-11 DIAGNOSIS — Z86711 Personal history of pulmonary embolism: Secondary | ICD-10-CM | POA: Insufficient documentation

## 2021-07-11 DIAGNOSIS — I428 Other cardiomyopathies: Secondary | ICD-10-CM | POA: Insufficient documentation

## 2021-07-11 DIAGNOSIS — I340 Nonrheumatic mitral (valve) insufficiency: Secondary | ICD-10-CM | POA: Insufficient documentation

## 2021-07-11 DIAGNOSIS — I5022 Chronic systolic (congestive) heart failure: Secondary | ICD-10-CM

## 2021-07-11 DIAGNOSIS — Z91148 Patient's other noncompliance with medication regimen for other reason: Secondary | ICD-10-CM

## 2021-07-11 DIAGNOSIS — Z79899 Other long term (current) drug therapy: Secondary | ICD-10-CM | POA: Insufficient documentation

## 2021-07-11 DIAGNOSIS — R001 Bradycardia, unspecified: Secondary | ICD-10-CM | POA: Insufficient documentation

## 2021-07-11 DIAGNOSIS — I5042 Chronic combined systolic (congestive) and diastolic (congestive) heart failure: Secondary | ICD-10-CM | POA: Insufficient documentation

## 2021-07-11 DIAGNOSIS — I10 Essential (primary) hypertension: Secondary | ICD-10-CM

## 2021-07-11 DIAGNOSIS — Z7984 Long term (current) use of oral hypoglycemic drugs: Secondary | ICD-10-CM | POA: Insufficient documentation

## 2021-07-11 DIAGNOSIS — I110 Hypertensive heart disease with heart failure: Secondary | ICD-10-CM | POA: Insufficient documentation

## 2021-07-11 LAB — CBC
HCT: 38.7 % (ref 36.0–46.0)
Hemoglobin: 12.3 g/dL (ref 12.0–15.0)
MCH: 29.6 pg (ref 26.0–34.0)
MCHC: 31.8 g/dL (ref 30.0–36.0)
MCV: 93.3 fL (ref 80.0–100.0)
Platelets: 272 10*3/uL (ref 150–400)
RBC: 4.15 MIL/uL (ref 3.87–5.11)
RDW: 12.9 % (ref 11.5–15.5)
WBC: 4.8 10*3/uL (ref 4.0–10.5)
nRBC: 0 % (ref 0.0–0.2)

## 2021-07-11 LAB — BASIC METABOLIC PANEL
Anion gap: 3 — ABNORMAL LOW (ref 5–15)
BUN: 11 mg/dL (ref 6–20)
CO2: 27 mmol/L (ref 22–32)
Calcium: 9.1 mg/dL (ref 8.9–10.3)
Chloride: 109 mmol/L (ref 98–111)
Creatinine, Ser: 0.8 mg/dL (ref 0.44–1.00)
GFR, Estimated: >60 mL/min (ref >60)
Glucose, Bld: 96 mg/dL (ref 70–99)
Potassium: 5.1 mmol/L (ref 3.5–5.1)
Sodium: 139 mmol/L (ref 135–145)

## 2021-07-11 LAB — ECHOCARDIOGRAM COMPLETE
Area-P 1/2: 3.06 cm2
Calc EF: 28 %
MV M vel: 4.97 m/s
MV Peak grad: 98.8 mmHg
Radius: 0.4 cm
S' Lateral: 5 cm
Single Plane A2C EF: 33.9 %
Single Plane A4C EF: 24.6 %

## 2021-07-11 LAB — BRAIN NATRIURETIC PEPTIDE: B Natriuretic Peptide: 298.4 pg/mL — ABNORMAL HIGH (ref 0.0–100.0)

## 2021-07-11 MED ORDER — DAPAGLIFLOZIN PROPANEDIOL 10 MG PO TABS
10.0000 mg | ORAL_TABLET | Freq: Every day | ORAL | 3 refills | Status: DC
  Filled 2021-07-11: qty 30, 30d supply, fill #0
  Filled 2022-04-28: qty 30, 30d supply, fill #1
  Filled 2022-06-12: qty 30, 30d supply, fill #2

## 2021-07-11 NOTE — Patient Instructions (Signed)
Medication Changes: ? ?Restart Farxiga 10 mg Daily, prescription has been sent to [facility] for you ? ?Lab Work: ? ?Labs done today, your results will be available in MyChart, we will contact you for abnormal readings. ? ?Testing/Procedures: ? ?Your physician has recommended that you have a cardiopulmonary stress test (CPX). CPX testing is a non-invasive measurement of heart and lung function. It replaces a traditional treadmill stress test. This type of test provides a tremendous amount of information that relates not only to your present condition but also for future outcomes. This test combines measurements of you ventilation, respiratory gas exchange in the lungs, electrocardiogram (EKG), blood pressure and physical response before, during, and following an exercise protocol. ? ?Referrals: ? ?None ? ?Special Instructions // Education: ? ?Do the following things EVERYDAY: ?Weigh yourself in the morning before breakfast. Write it down and keep it in a log. ?Take your medicines as prescribed ?Eat low salt foods--Limit salt (sodium) to 2000 mg per day.  ?Stay as active as you can everyday ?Limit all fluids for the day to less than 2 liters ? ? ?Follow-Up in: 2-3 months ? ?At the [facility], you and your health needs are our priority. We have a designated team specialized in the treatment of Heart Failure. This Care Team includes your primary Heart Failure Specialized Cardiologist (physician), Advanced Practice Providers (APPs- Physician Assistants and Nurse Practitioners), and Pharmacist who all work together to provide you with the care you need, when you need it.  ? ?You may see any of the following providers on your designated Care Team at your next follow up: ? ?Dr Arvilla Meres ?Dr Marca Ancona ?Tonye Becket, NP ?[name], PA ?Jessica Milford,NP ?Anna Genre, PA ?Karle Plumber, PharmD ? ? ?Please be sure to bring in all your medications bottles to every  appointment.  ? ?Need to Contact us: ? ?If you have any questions or concerns before your next appointment please send [place] a message through [place] or call our office at [phone].   ? ?TO LEAVE A MESSAGE FOR THE NURSE SELECT OPTION 2, PLEASE LEAVE A MESSAGE INCLUDING: ?YOUR NAME ?DATE OF BIRTH ?CALL BACK NUMBER ?REASON FOR CALL**this is important as we prioritize the call backs ? ?YOU WILL RECEIVE A CALL BACK THE SAME DAY AS LONG AS YOU CALL BEFORE 4:00 PM ? ? ?

## 2021-07-11 NOTE — Progress Notes (Addendum)
?ADVANCED HEART FAILURE CLINIC NOTE ? ?PCP: Raliegh Ip FNP  ?HF Cardiologist: Dr Gala Romney  ? ?Reason for visit: Heart Failure  ? ?HPI: ?Alexandria Rhodes is a 46 y.o. female with a history of non-ischemic cardiomyopathy with EF as low as 10% in 2016 (had LifeVest) but EF 40-45% on Echo in 1/22, prior PE in 03/2018 no longer on anticoagulation, migraine, and hypertension. ? ?She was admitted to a hospital in [place] in 08/2014 after presenting with CP and SOB.  She was found to have an EF of 10%. She states she underwent heart cath which showed no CAD. [facility] [place].  She was started on GDMT and was on Entresto 97-103 mg bid,  Coreg 6.25 mg bid and Spironolactone 12.5 mg daily.  She moved to [place] at the end of 2019. ? ?Admitted to [facility] in 03/2018 with CP and found to have an acute PE felt to be provoked by recent move from [place]. Echo showed LVEF of 65-70% with normal wall motion, grade 1 DD, normal RV function, and dilated IVC.  She was started on Eliquis but was only able to complete 1 month of therapy due to cost.  She also could not afford her HF medications and has not been on anything x 1 year. ? ?Admitted to [facility] on 5/21 with A/C CHF,  had been out of her medications for a week. Diuresed with IV lasix and started on HF meds. D/C weight 156 pounds. Given all medications from HF fund at discharge. Referred to HF social work to help w/ meds and transportation to and from appts.  ? ?cMRI 5/21 LVEF 29%, diffuse HK, paradoxical septal motion, midwall LGE, non-specific sign consistent with NICM, significant trabeculations in basal and mid myocardial segments. ? ?08/2019 digoxin stopped due to rash and improved symptoms. She canceled all follow up appointments and did not reschedule.  ? ?Follow up 11/22 she had been out of her medicines x 8 months. Volume up, NYHA II-III. GDMT restarted & enrolled in [facility]. ? ?Echo 11/22: EF 30-35%, LV  moderately decreased, global HK, RV ok, mild to moderate MR ? ?Today she returns for HF follow up.Paramedicine no longer following as she was doing well. Still working FT at [employer]. Denies SOB, orthopnea, PND or edema. Out of [name] and Sherryll Burger due to lack of coverage for 1 months ? ?Echo today 07/11/21: EF 25-30% (read as 30-35%) moderate MR G2DD  ? ?Cardiac Studies: ?- Echo 11/22: EF 30-35%, LV moderately decreased, global HK, RV ok, mild to moderate MR ? ?- Echo 04/10/20: EF 40-45% ? ?- Echo 07/2019: EF 20-25% Grade II DD ? ?- cMRI 07/2019: LVEF 29%, diffuse HK, paradoxical septal motion, midwall LGE, non-specific sign consistent with NICM, significant trabeculations in basal and mid myocardial segments. ? ?- Echo 03/2018: EF 65-70%  ? ?ROS: All systems negative except as listed in HPI, PMH and Problem List. ? ?SH:  ?Social History  ? ?Socioeconomic History  ? Marital status: Single  ?  Spouse name: Not on file  ? Number of children: Not on file  ? Years of education: Not on file  ? Highest education level: Not on file  ?Occupational History  ? Not on file  ?Tobacco Use  ? Smoking status: Former  ? Smokeless tobacco: Never  ?Vaping Use  ? Vaping Use: Never used  ?Substance and Sexual Activity  ? Alcohol use: Not Currently  ? Drug use: Not  Currently  ? Sexual activity: Yes  ?Other Topics Concern  ? Not on file  ?Social History Narrative  ? Not on file  ? ?Social Determinants of Health  ? ?Financial Resource Strain: High Risk  ? Difficulty of Paying Living Expenses: Very hard  ?Food Insecurity: Food Insecurity Present  ? Worried About Programme researcher, broadcasting/film/video in the Last Year: Sometimes true  ? Ran Out of Food in the Last Year: Sometimes true  ?Transportation Needs: Unmet Transportation Needs  ? Lack of Transportation (Medical): Yes  ? Lack of Transportation (Non-Medical): Yes  ?Physical Activity: Not on file  ?Stress: Not on file  ?Social Connections: Not on file  ?Intimate Partner Violence: Not on file  ? ?FH:   ?Family History  ?Problem Relation Age of Onset  ? Hypertension Mother   ? ?Past Medical History:  ?Diagnosis Date  ? Acute pulmonary embolism (HCC)   ? Cardiomyopathy (HCC)   ? CHF (congestive heart failure) (HCC)   ? Hypertension   ? Non-ischemic cardiomyopathy (HCC) 07/2019  ? ?Current Outpatient Medications  ?Medication Sig Dispense Refill  ? acetaminophen (TYLENOL) 500 MG tablet Take 1,000-1,500 mg by mouth 2 (two) times daily as needed for mild pain or headache.    ? furosemide (LASIX) 40 MG tablet Take 1 tablet (40 mg total) by mouth daily. 30 tablet 3  ? potassium chloride SA (KLOR-CON M) 20 MEQ tablet Take 1 tablet (20 mEq total) by mouth daily. 30 tablet 3  ? spironolactone (ALDACTONE) 25 MG tablet Take 0.5 tablets (12.5 mg total) by mouth daily. 15 tablet 5  ? SUMAtriptan (IMITREX) 50 MG tablet Take 50 mg by mouth every 2 (two) hours as needed for migraine. May repeat in 2 hours if headache persists or recurs.    ? dapagliflozin propanediol (FARXIGA) 10 MG TABS tablet Take 1 tablet (10 mg total) by mouth daily. (Patient not taking: Reported on 07/11/2021) 30 tablet 3  ? sacubitril-valsartan (ENTRESTO) 24-26 MG Take 1 tablet by mouth 2 (two) times daily. (Patient not taking: Reported on 07/11/2021) 60 tablet 11  ? ?No current facility-administered medications for this encounter.  ? ?BP 104/70   Pulse (!) 41   Wt 76.3 kg (168 lb 3.2 oz)   LMP 03/27/2014 (Approximate)   SpO2 98%   BMI 31.78 kg/m?  ? ?Wt Readings from Last 3 Encounters:  ?07/11/21 76.3 kg (168 lb 3.2 oz)  ?04/21/21 72.6 kg (160 lb)  ?04/09/21 72.8 kg (160 lb 6.4 oz)  ? ?PHYSICAL EXAM: ?General:  Well appearing. No resp difficulty ?HEENT: normal ?Neck: supple. JVP hard to see looks elevated Carotids 2+ bilat; no bruits. No lymphadenopathy or thryomegaly appreciated. ?Cor: PMI nondisplaced. Regular rate & rhythm. 2/6 MR ?Lungs: clear ?Abdomen: soft, nontender, nondistended. No hepatosplenomegaly. No bruits or masses. Good bowel  sounds. ?Extremities: no cyanosis, clubbing, rash, edema ?Neuro: alert & orientedx3, cranial nerves grossly intact. moves all 4 extremities w/o difficulty. Affect pleasant ? ? ?ECG: SB 41 bpm, 1st degree AVB PR 210 +LVH with repol ? ?ReDs: 29% ? ?ASSESSMENT & PLAN: ? ?1. 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  She was started on guideline based medications and EF improved to 65-70% on echo in 03/2018.   ?- Echo 5/21 EF back down to 20-25% (in the setting of poor med compliance) with global hypokinesis, grade II diastolic dysfunction, severely dilated left atrium, moderate  MR ?- cMRI 5/21 EF 29% minimal LGE. + non-compaction. Severe LAE. Mod-severe MR. Normal RV ?- Echo 04/10/20 EF 40-45%  ?- Echo 11/22 EF 30-35% ?- Echo today 07/11/21: EF 25-30% (read as 30-35%) moderate MR G2DD  ?- Stable NYHA II. Volume status looks a bit elevated. REDs ok at 29% ?- Off Entresto due to coverage. BP too low to restart ?- Off Farxiga due to coverage. Will restart ?- Continue spironolactone 12.5 mg daily. ?- Continue Lasix 40 mg daily + KCL 20 daily.  ?- Off coreg due to hypotension/bradycardia. HR 46 today.  ?- She is off digoxin given previous rash and improvement of EF. Will not re-challenge today with HR 46 ?- EF not improving. I worry she will be heading toward advanced therapies down the road. Long talk about need for better med compliance. Will get CPX to further risk stratify.  ?- Need to consider ICD at next visit ? ?2. Bradycardia ?- no evidence of infiltrative disease on cMRI ?- Zio 1/23  ?1. Sinus rhythm - avg HR of 84 bpm.  ?2. Rare PACs and PVCs ? ?3. HTN ?- BP now running low ? ?4. History of PE ?- Provoked in the setting of prolonged travel. ?- Treated w/ Eliquis. No longer on a/c. ? ?5. Non-compliance ?- Discussion as above.  ? ? ?Arvilla Meres, MD  ?10:37 AM ?

## 2021-07-11 NOTE — Progress Notes (Signed)
?  Echocardiogram ?2D Echocardiogram has been performed. ? ?Alexandria Rhodes ?07/11/2021, 9:41 AM ?

## 2021-07-11 NOTE — Progress Notes (Signed)
ReDS Vest / Clip - 07/11/21 1100   ? ?  ? ReDS Vest / Clip  ? Station Marker A   ? Ruler Value 28.5   ? ReDS Value Range Low volume   ? ReDS Actual Value 29   ? ?  ?  ? ?  ? ? ?

## 2021-07-29 ENCOUNTER — Encounter (HOSPITAL_COMMUNITY): Payer: Self-pay

## 2021-08-12 ENCOUNTER — Encounter (HOSPITAL_COMMUNITY): Payer: Self-pay

## 2021-08-12 ENCOUNTER — Other Ambulatory Visit (HOSPITAL_COMMUNITY): Payer: Self-pay | Admitting: *Deleted

## 2021-08-12 DIAGNOSIS — I5022 Chronic systolic (congestive) heart failure: Secondary | ICD-10-CM

## 2021-08-29 ENCOUNTER — Inpatient Hospital Stay (HOSPITAL_COMMUNITY)
Admission: RE | Admit: 2021-08-29 | Discharge: 2021-08-29 | Disposition: A | Discharge status: Home / Self Care | Payer: Self-pay | Source: Ambulatory Visit | Attending: Internal Medicine | Admitting: Internal Medicine

## 2021-08-29 NOTE — Progress Notes (Signed)
ADVANCED HEART FAILURE CLINIC NOTE  PCP: Raliegh Ip FNP  HF Cardiologist: Dr Gala Romney   Reason for visit: Heart Failure   HPI: Alexandria Rhodes is a 46 y.o. female with a history of non-ischemic cardiomyopathy with EF as low as 10% in 2016 (had LifeVest) but EF 40-45% on Echo in 1/22, prior PE in 03/2018 no longer on anticoagulation, migraine, and hypertension.  She was admitted to a hospital in [place] in 08/2014 after presenting with CP and SOB.  She was found to have an EF of 10%. She states she underwent heart cath which showed no CAD. She was [facility] [place].  She was started on GDMT and was on Entresto 97-103 mg bid,  Coreg 6.25 mg bid and Spironolactone 12.5 mg daily.  She moved to [place] at the end of 2019.  Admitted to [facility] in 03/2018 with CP and found to have an acute PE felt to be provoked by recent move from [place]. Echo showed LVEF of 65-70% with normal wall motion, grade 1 DD, normal RV function, and dilated IVC.  She was started on Eliquis but was only able to complete 1 month of therapy due to cost.  She also could not afford her HF medications and has not been on anything x 1 year.  Admitted to [facility] on 5/21 with A/C CHF,  had been out of her medications for a week. Diuresed with IV lasix and started on HF meds. D/C weight 156 pounds. Given all medications from HF fund at discharge. Referred to HF social work to help w/ meds and transportation to and from appts.   cMRI 5/21 LVEF 29%, diffuse HK, paradoxical septal motion, midwall LGE, non-specific sign consistent with NICM, significant trabeculations in basal and mid myocardial segments.  08/2019 digoxin stopped due to rash and improved symptoms. She canceled all follow up appointments and did not reschedule.   Follow up 11/22 she had been out of her medicines x 8 months. Volume up, NYHA II-III. GDMT restarted & enrolled in [facility]amed[facility].  Echo 11/22: EF 30-35%, LV  moderately decreased, global HK, RV ok, mild to moderate MR  Today she returns for HF follow up.Paramedicine no longer following as she was doing well. Still working FT at [employer]. Denies SOB, orthopnea, PND or edema. Out of [name] and Sherryll Burger due to lack of coverage for 1 months  Echo 07/11/21: EF 25-30% (read as 30-35%) moderate MR G2DD   Cardiac Studies: - Echo 11/22: EF 30-35%, LV moderately decreased, global HK, RV ok, mild to moderate MR  - Echo 04/10/20: EF 40-45%  - Echo 07/2019: EF 20-25% Grade II DD  - cMRI 07/2019: LVEF 29%, diffuse HK, paradoxical septal motion, midwall LGE, non-specific sign consistent with NICM, significant trabeculations in basal and mid myocardial segments.  - Echo 03/2018: EF 65-70%   ROS: All systems negative except as listed in HPI, PMH and Problem List.  SH:  Social History   Socioeconomic History   Marital status: Single    Spouse name: Not on file   Number of children: Not on file   Years of education: Not on file   Highest education level: Not on file  Occupational History   Not on file  Tobacco Use   Smoking status: Former   Smokeless tobacco: Never  Vaping Use   Vaping Use: Never used  Substance and Sexual Activity   Alcohol use: Not Currently   Drug use: Not Currently  Sexual activity: Yes  Other Topics Concern   Not on file  Social History Narrative   Not on file   Social Determinants of Health   Financial Resource Strain: High Risk (06/13/2021)   Overall Financial Resource Strain (CARDIA)    Difficulty of Paying Living Expenses: Very hard  Food Insecurity: Food Insecurity Present (02/17/2021)   Hunger Vital Sign    Worried About Running Out of Food in the Last Year: Sometimes true    Ran Out of Food in the Last Year: Sometimes true  Transportation Needs: Unmet Transportation Needs (01/24/2021)   PRAPARE - Administrator, Civil Service (Medical): Yes    Lack of Transportation (Non-Medical): Yes  Physical  Activity: Unknown (03/28/2018)   Exercise Vital Sign    Days of Exercise per Week: Patient refused    Minutes of Exercise per Session: Patient refused  Stress: No Stress Concern Present (03/28/2018)   [employer] of Occupational Health - Occupational Stress Questionnaire    Feeling of Stress : Not at all  Social Connections: Unknown (03/28/2018)   Social Connection and Isolation Panel [NHANES]    Frequency of Communication with Friends and Family: Patient refused    Frequency of Social Gatherings with Friends and Family: Patient refused    Attends Religious Services: Patient refused    Active Member of Clubs or Organizations: Patient refused    Attends Banker Meetings: Patient refused    Marital Status: Patient refused  Intimate Partner Violence: Unknown (03/28/2018)   Humiliation, Afraid, Rape, and Kick questionnaire    Fear of Current or Ex-Partner: Patient refused    Emotionally Abused: Patient refused    Physically Abused: Patient refused    Sexually Abused: Patient refused   FH:  Family History  Problem Relation Age of Onset   Hypertension Mother    Past Medical History:  Diagnosis Date   Acute pulmonary embolism (HCC)    Cardiomyopathy (HCC)    CHF (congestive heart failure) (HCC)    Hypertension    Non-ischemic cardiomyopathy (HCC) 07/2019   Current Outpatient Medications  Medication Sig Dispense Refill   acetaminophen (TYLENOL) 500 MG tablet Take 1,000-1,500 mg by mouth 2 (two) times daily as needed for mild pain or headache.     dapagliflozin propanediol (FARXIGA) 10 MG TABS tablet Take 1 tablet (10 mg total) by mouth daily. 30 tablet 3   furosemide (LASIX) 40 MG tablet Take 1 tablet (40 mg total) by mouth daily. 30 tablet 3   potassium chloride SA (KLOR-CON M) 20 MEQ tablet Take 1 tablet (20 mEq total) by mouth daily. 30 tablet 3   spironolactone (ALDACTONE) 25 MG tablet Take 0.5 tablets (12.5 mg total) by mouth daily. 15 tablet 5   SUMAtriptan  (IMITREX) 50 MG tablet Take 50 mg by mouth every 2 (two) hours as needed for migraine. May repeat in 2 hours if headache persists or recurs.     No current facility-administered medications for this encounter.   LMP 03/27/2014 (Approximate)   Wt Readings from Last 3 Encounters:  07/11/21 76.3 kg (168 lb 3.2 oz)  04/21/21 72.6 kg (160 lb)  04/09/21 72.8 kg (160 lb 6.4 oz)   PHYSICAL EXAM: General:  Well appearing. No resp difficulty HEENT: normal Neck: supple. JVP hard to see looks elevated Carotids 2+ bilat; no bruits. No lymphadenopathy or thryomegaly appreciated. Cor: PMI nondisplaced. Regular rate & rhythm. 2/6 MR Lungs: clear Abdomen: soft, nontender, nondistended. No hepatosplenomegaly. No bruits or masses. Good bowel  sounds. Extremities: no cyanosis, clubbing, rash, edema Neuro: alert & orientedx3, cranial nerves grossly intact. moves all 4 extremities w/o difficulty. Affect pleasant   ECG: SB 41 bpm, 1st degree AVB PR 210 +LVH with repol  ReDs: 29%  ASSESSMENT & PLAN:  1. 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  She was started on guideline based medications and EF improved to 65-70% on echo in 03/2018.   - Echo 5/21 EF back down to 20-25% (in the setting of poor med compliance) with global hypokinesis, grade II diastolic dysfunction, severely dilated left atrium, moderate MR - cMRI 5/21 EF 29% minimal LGE. + non-compaction. Severe LAE. Mod-severe MR. Normal RV - Echo 04/10/20 EF 40-45%  - Echo 11/22 EF 30-35% - Echo today 07/11/21: EF 25-30% (read as 30-35%) moderate MR G2DD  - Stable NYHA II. Volume status looks a bit elevated. REDs ok at 29% - Off Entresto due to coverage. BP too low to restart - Off Farxiga due to coverage. Will restart - Continue spironolactone 12.5 mg daily. - Continue Lasix 40 mg daily + KCL 20 daily.  - Off coreg due to hypotension/bradycardia. HR 46 today.  -  She is off digoxin given previous rash and improvement of EF. Will not re-challenge today with HR 46 - EF not improving. I worry she will be heading toward advanced therapies down the road. Long talk about need for better med compliance. Will get CPX to further risk stratify.  - Need to consider ICD at next visit  2. Bradycardia - no evidence of infiltrative disease on cMRI - Zio 1/23  1. Sinus rhythm - avg HR of 84 bpm.  2. Rare PACs and PVCs  3. HTN - BP now running low  4. History of PE - Provoked in the setting of prolonged travel. - Treated w/ Eliquis. No longer on a/c.  5. Non-compliance - Discussion as above.    Arvilla Meres, MD  10:27 AM

## 2021-10-21 ENCOUNTER — Other Ambulatory Visit (HOSPITAL_COMMUNITY): Payer: Self-pay

## 2021-12-12 ENCOUNTER — Other Ambulatory Visit (HOSPITAL_COMMUNITY): Payer: Self-pay

## 2021-12-14 ENCOUNTER — Other Ambulatory Visit (HOSPITAL_COMMUNITY): Payer: Self-pay

## 2021-12-15 ENCOUNTER — Other Ambulatory Visit (HOSPITAL_COMMUNITY): Payer: Self-pay

## 2022-02-04 ENCOUNTER — Other Ambulatory Visit (HOSPITAL_COMMUNITY): Payer: Self-pay

## 2022-02-26 ENCOUNTER — Emergency Department (HOSPITAL_COMMUNITY)
Admission: EM | Admit: 2022-02-26 | Discharge: 2022-02-27 | Disposition: A | Discharge status: Home / Self Care | Payer: Self-pay | Attending: Emergency Medicine | Admitting: Emergency Medicine

## 2022-02-26 ENCOUNTER — Emergency Department (HOSPITAL_COMMUNITY): Payer: Self-pay

## 2022-02-26 DIAGNOSIS — I110 Hypertensive heart disease with heart failure: Secondary | ICD-10-CM | POA: Insufficient documentation

## 2022-02-26 DIAGNOSIS — Z20822 Contact with and (suspected) exposure to covid-19: Secondary | ICD-10-CM | POA: Insufficient documentation

## 2022-02-26 DIAGNOSIS — R0601 Orthopnea: Secondary | ICD-10-CM | POA: Insufficient documentation

## 2022-02-26 DIAGNOSIS — I509 Heart failure, unspecified: Secondary | ICD-10-CM | POA: Insufficient documentation

## 2022-02-26 DIAGNOSIS — J069 Acute upper respiratory infection, unspecified: Secondary | ICD-10-CM

## 2022-02-26 DIAGNOSIS — R0789 Other chest pain: Secondary | ICD-10-CM

## 2022-02-26 LAB — TROPONIN I (HIGH SENSITIVITY)
Troponin I (High Sensitivity): 13 ng/L (ref <18)
Troponin I (High Sensitivity): 13 ng/L (ref <18)

## 2022-02-26 LAB — BASIC METABOLIC PANEL
Anion gap: 9 (ref 5–15)
BUN: 11 mg/dL (ref 6–20)
CO2: 22 mmol/L (ref 22–32)
Calcium: 9.1 mg/dL (ref 8.9–10.3)
Chloride: 112 mmol/L — ABNORMAL HIGH (ref 98–111)
Creatinine, Ser: 0.87 mg/dL (ref 0.44–1.00)
GFR, Estimated: >60 mL/min (ref >60)
Glucose, Bld: 83 mg/dL (ref 70–99)
Potassium: 3.5 mmol/L (ref 3.5–5.1)
Sodium: 143 mmol/L (ref 135–145)

## 2022-02-26 LAB — CBC
HCT: 36 % (ref 36.0–46.0)
Hemoglobin: 11.7 g/dL — ABNORMAL LOW (ref 12.0–15.0)
MCH: 29.1 pg (ref 26.0–34.0)
MCHC: 32.5 g/dL (ref 30.0–36.0)
MCV: 89.6 fL (ref 80.0–100.0)
Platelets: 287 10*3/uL (ref 150–400)
RBC: 4.02 MIL/uL (ref 3.87–5.11)
RDW: 13.6 % (ref 11.5–15.5)
WBC: 5.3 10*3/uL (ref 4.0–10.5)
nRBC: 0 % (ref 0.0–0.2)

## 2022-02-26 LAB — BRAIN NATRIURETIC PEPTIDE: B Natriuretic Peptide: 596.1 pg/mL — ABNORMAL HIGH (ref 0.0–100.0)

## 2022-02-26 LAB — I-STAT BETA HCG BLOOD, ED (MC, WL, AP ONLY): I-stat hCG, quantitative: 6.8 m[IU]/mL — ABNORMAL HIGH (ref <5)

## 2022-02-26 NOTE — ED Triage Notes (Signed)
Patient BIB [facility] from work for evaluation of chest pain that started around 0730 this morning. Pain described as sharp and constant in center of her chest. 2x SL NTG and 324mg  ASA given by EMS PTA. 20g saline lock in left wrist. Patietn is alert, oriented, and in no apparent distress at this time.

## 2022-02-26 NOTE — ED Provider Triage Note (Signed)
Emergency Medicine Provider Triage Evaluation Note  Eugina Row , a 46 y.o. female  was evaluated in triage.  Pt complains of chest pain that started while patient was at work today.  Has history of cardiomyopathy.  States over the past 2 weeks she has had 3 pillow orthopnea increased from 1 pillow.  Denies peripheral edema.  Review of Systems  Positive: As above Negative: As above  Physical Exam  LMP 03/27/2014 (Approximate)  Gen:   Awake, no distress   Resp:  Normal effort  MSK:   Moves extremities without difficulty  Other:    Medical Decision Making  Medically screening exam initiated at 2:05 PM.  Appropriate orders placed.  Mishell Donalson was informed that the remainder of the evaluation will be completed by another provider, this initial triage assessment does not replace that evaluation, and the importance of remaining in the ED until their evaluation is complete.     Marita Kansas, PA-C 02/26/22 1406

## 2022-02-27 LAB — D-DIMER, QUANTITATIVE: D-Dimer, Quant: <0.27 ug/mL-FEU (ref 0.00–0.50)

## 2022-02-27 LAB — RESP PANEL BY RT-PCR (RSV, FLU A&B, COVID)  RVPGX2
Influenza A by PCR: NEGATIVE
Influenza B by PCR: NEGATIVE
Resp Syncytial Virus by PCR: NEGATIVE
SARS Coronavirus 2 by RT PCR: NEGATIVE

## 2022-02-27 NOTE — ED Provider Notes (Signed)
[facility] EMERGENCY DEPARTMENT Provider Note   CSN: [id number] Arrival date & time: 02/26/22  1342     History  Chief Complaint  Patient presents with   Chest Pain    Alexandria Rhodes is a 46 y.o. female.  HPI     This is a 46 year old female who presents with chest discomfort.  Patient reports that she was at work when she began to develop some chest discomfort.  She states that it was sharp.  It was nonexertional.  She is currently pain-free.  She does report history of cardiomyopathy.  At baseline she has some orthopnea.  Patient states that she has had recent cough and known sick contact.  No fevers.  She states generally she is feeling better than she was when she first got here.  Patient reports compliance with her heart medications.  Home Medications Prior to Admission medications   Medication Sig Start Date End Date Taking? Authorizing Provider  acetaminophen (TYLENOL) 500 MG tablet Take 1,000-1,500 mg by mouth 2 (two) times daily as needed for mild pain or headache.    [provider]  dapagliflozin propanediol (FARXIGA) 10 MG TABS tablet Take 1 tablet (10 mg total) by mouth daily. 07/11/21   Bensimhon, Bevelyn Buckles, MD  furosemide (LASIX) 40 MG tablet Take 1 tablet (40 mg total) by mouth daily. 06/05/21   Bensimhon, Bevelyn Buckles, MD  potassium chloride SA (KLOR-CON M) 20 MEQ tablet Take 1 tablet (20 mEq total) by mouth daily. 06/05/21   Bensimhon, Bevelyn Buckles, MD  spironolactone (ALDACTONE) 25 MG tablet Take 0.5 tablets (12.5 mg total) by mouth daily. 02/17/21   Bensimhon, Bevelyn Buckles, MD  SUMAtriptan (IMITREX) 50 MG tablet Take 50 mg by mouth every 2 (two) hours as needed for migraine. May repeat in 2 hours if headache persists or recurs.    [provider]      Allergies    Shellfish allergy    Review of Systems   Review of Systems  Constitutional:  Negative for fever.  Respiratory:  Positive for cough.   Cardiovascular:  Positive for chest  pain. Negative for leg swelling.  Gastrointestinal:  Negative for abdominal pain.  All other systems reviewed and are negative.   Physical Exam Updated Vital Signs BP (!) 123/93 (BP Location: Right Arm)   Pulse 73   Temp 97.9 F (36.6 C)   Resp 17   LMP 03/27/2014 (Approximate)   SpO2 100%  Physical Exam Vitals and nursing note reviewed.  Constitutional:      Appearance: She is well-developed. She is not ill-appearing.  HENT:     Head: Normocephalic and atraumatic.  Eyes:     Pupils: Pupils are equal, round, and reactive to light.  Cardiovascular:     Rate and Rhythm: Normal rate and regular rhythm.     Heart sounds: Normal heart sounds.  Pulmonary:     Effort: Pulmonary effort is normal. No respiratory distress.     Breath sounds: No wheezing.  Abdominal:     General: Bowel sounds are normal.     Palpations: Abdomen is soft.  Musculoskeletal:     Cervical back: Neck supple.     Right lower leg: No edema.     Left lower leg: No edema.  Skin:    General: Skin is warm and dry.  Neurological:     Mental Status: She is alert and oriented to person, place, and time.  Psychiatric:        Mood  and Affect: Mood normal.     ED Results / Procedures / Treatments   Labs (all labs ordered are listed, but only abnormal results are displayed) Labs Reviewed  BASIC METABOLIC PANEL - Abnormal; Notable for the following components:      Result Value   Chloride 112 (*)    All other components within normal limits  CBC - Abnormal; Notable for the following components:   Hemoglobin 11.7 (*)    All other components within normal limits  BRAIN NATRIURETIC PEPTIDE - Abnormal; Notable for the following components:   B Natriuretic Peptide 596.1 (*)    All other components within normal limits  I-STAT BETA HCG BLOOD, ED ([facility], [facility], [facility] ONLY) - Abnormal; Notable for the following components:   I-stat hCG, quantitative 6.8 (*)    All other components within normal limits  RESP PANEL BY  RT-PCR (RSV, FLU A&B, COVID)  RVPGX2  D-DIMER, QUANTITATIVE  TROPONIN I (HIGH SENSITIVITY)  TROPONIN I (HIGH SENSITIVITY)    EKG EKG Interpretation  Date/Time:  Thursday February 26 2022 13:42:25 EST Ventricular Rate:  69 PR Interval:  190 QRS Duration: 100 QT Interval:  402 QTC Calculation: 430 R Axis:   35 Text Interpretation: Normal sinus rhythm Moderate voltage criteria for LVH, may be normal variant ( Sokolow-Lyon , Cornell product ) Nonspecific ST and T wave abnormality Abnormal ECG When compared with ECG of 11-Jul-2021 10:13, PREVIOUS ECG IS PRESENT No significant change since last tracing Confirmed by Ross Marcus ([id number]) on 02/27/2022 1:56:49 AM  Radiology DG Chest 2 View  Result Date: 02/26/2022 CLINICAL DATA:  Chest pain EXAM: CHEST - 2 VIEW COMPARISON:  Chest radiograph dated January 21, 2021 FINDINGS: The heart is mildly enlarged. Both lungs are clear. The visualized skeletal structures are unremarkable. IMPRESSION: No active cardiopulmonary disease. Mild cardiomegaly. Electronically Signed   By: Larose Hires D.O.   On: 02/26/2022 14:37    Procedures Procedures    Medications Ordered in ED Medications - No data to display  ED Course/ Medical Decision Making/ A&P                           Medical Decision Making Amount and/or Complexity of Data Reviewed Labs: ordered. Radiology: ordered.   This patient presents to the ED for concern of chest pain, URI symptoms, this involves an extensive number of treatment options, and is a complaint that carries with it a high risk of complications and morbidity.  I considered the following differential and admission for this acute, potentially life threatening condition.  The differential diagnosis includes ACS, PE, pneumothorax, pneumonia, viral symptoms such as COVID or influenza  MDM:    This is a 46 year old female with history of nonischemic heart myopathy who presents with chest discomfort and upper respiratory  symptoms.  She is overall nontoxic and vital signs are reassuring.  She is afebrile.  O2 sats 100%.  She is currently asymptomatic.  EKG today is really unchanged from prior.  Symptoms are fairly atypical for ACS.  Troponin x 2 is negative which is reassuring.  Chest x-ray shows no significant edema.  She is not hypoxic.  D-dimer is negative.  BNP is stably elevated.  Viral testing is negative for COVID, influenza, RSV.  Given atypical nature of symptoms, do not feel patient needs an inpatient workup at this time.  Will have him follow back up with Dr. Gala Romney.  She should continue taking her medications as prescribed.  (  Labs, imaging, consults)  Labs: I Ordered, and personally interpreted labs.  The pertinent results include: CBC, BMP, troponin, BNP, D-dimer, respiratory viral panel  Imaging Studies ordered: I ordered imaging studies including chest x-ray I independently visualized and interpreted imaging. I agree with the radiologist interpretation  Additional history obtained from chart review.  External records from outside source obtained and reviewed including cardiology notes  Cardiac Monitoring: The patient was maintained on a cardiac monitor.  I personally viewed and interpreted the cardiac monitored which showed an underlying rhythm of: Sinus rhythm  Reevaluation: After the interventions noted above, I reevaluated the patient and found that they have :stayed the same  Social Determinants of Health:  lives independently  Disposition: Discharge  Co morbidities that complicate the patient evaluation  Past Medical History:  Diagnosis Date   Acute pulmonary embolism (HCC)    Cardiomyopathy (HCC)    CHF (congestive heart failure) (HCC)    Hypertension    Non-ischemic cardiomyopathy (HCC) 07/2019     Medicines No orders of the defined types were placed in this encounter.   I have reviewed the patients home medicines and have made adjustments as needed  Problem List / ED  Course: Problem List Items Addressed This Visit   None Visit Diagnoses     Atypical chest pain    -  Primary   Viral URI with cough                       Final Clinical Impression(s) / ED Diagnoses Final diagnoses:  Atypical chest pain  Viral URI with cough    Rx / DC Orders ED Discharge Orders     None         Shon Baton, MD 02/27/22 [phone]

## 2022-02-27 NOTE — Discharge Instructions (Signed)
You were seen today for chest pain and upper respiratory symptoms.  Your workup is reassuring.  This is including heart testing.  Your COVID and influenza testing are negative.  Take Tylenol or ibuprofen as needed for pain.  Follow-up closely with your primary physician.

## 2022-02-27 NOTE — ED Notes (Signed)
Patient verbalizes understanding of d/c instructions. Opportunities for questions and answers were provided. Pt d/c from ED and ambulated to lobby.  

## 2022-04-06 ENCOUNTER — Encounter (HOSPITAL_COMMUNITY): Payer: Self-pay

## 2022-04-06 ENCOUNTER — Other Ambulatory Visit: Payer: Self-pay

## 2022-04-06 ENCOUNTER — Emergency Department (HOSPITAL_COMMUNITY): Payer: Self-pay

## 2022-04-06 ENCOUNTER — Other Ambulatory Visit (HOSPITAL_COMMUNITY): Payer: Self-pay

## 2022-04-06 ENCOUNTER — Emergency Department (HOSPITAL_COMMUNITY)
Admission: EM | Admit: 2022-04-06 | Discharge: 2022-04-06 | Disposition: A | Discharge status: Home / Self Care | Payer: Self-pay | Attending: Emergency Medicine | Admitting: Emergency Medicine

## 2022-04-06 DIAGNOSIS — J181 Lobar pneumonia, unspecified organism: Secondary | ICD-10-CM | POA: Insufficient documentation

## 2022-04-06 DIAGNOSIS — Z87891 Personal history of nicotine dependence: Secondary | ICD-10-CM | POA: Insufficient documentation

## 2022-04-06 DIAGNOSIS — R062 Wheezing: Secondary | ICD-10-CM

## 2022-04-06 DIAGNOSIS — J189 Pneumonia, unspecified organism: Secondary | ICD-10-CM

## 2022-04-06 DIAGNOSIS — I509 Heart failure, unspecified: Secondary | ICD-10-CM | POA: Insufficient documentation

## 2022-04-06 DIAGNOSIS — Z1152 Encounter for screening for COVID-19: Secondary | ICD-10-CM | POA: Insufficient documentation

## 2022-04-06 LAB — CBC
HCT: 32.4 % — ABNORMAL LOW (ref 36.0–46.0)
Hemoglobin: 10.1 g/dL — ABNORMAL LOW (ref 12.0–15.0)
MCH: 29.5 pg (ref 26.0–34.0)
MCHC: 31.2 g/dL (ref 30.0–36.0)
MCV: 94.7 fL (ref 80.0–100.0)
Platelets: 240 10*3/uL (ref 150–400)
RBC: 3.42 MIL/uL — ABNORMAL LOW (ref 3.87–5.11)
RDW: 14.5 % (ref 11.5–15.5)
WBC: 6.6 10*3/uL (ref 4.0–10.5)
nRBC: 0 % (ref 0.0–0.2)

## 2022-04-06 LAB — COMPREHENSIVE METABOLIC PANEL
ALT: 20 U/L (ref 0–44)
AST: 29 U/L (ref 15–41)
Albumin: 3.4 g/dL — ABNORMAL LOW (ref 3.5–5.0)
Alkaline Phosphatase: 64 U/L (ref 38–126)
Anion gap: 8 (ref 5–15)
BUN: 13 mg/dL (ref 6–20)
CO2: 20 mmol/L — ABNORMAL LOW (ref 22–32)
Calcium: 8.8 mg/dL — ABNORMAL LOW (ref 8.9–10.3)
Chloride: 111 mmol/L (ref 98–111)
Creatinine, Ser: 0.94 mg/dL (ref 0.44–1.00)
GFR, Estimated: >60 mL/min (ref >60)
Glucose, Bld: 95 mg/dL (ref 70–99)
Potassium: 4.1 mmol/L (ref 3.5–5.1)
Sodium: 139 mmol/L (ref 135–145)
Total Bilirubin: 1.1 mg/dL (ref 0.3–1.2)
Total Protein: 6.9 g/dL (ref 6.5–8.1)

## 2022-04-06 LAB — TROPONIN I (HIGH SENSITIVITY)
Troponin I (High Sensitivity): 21 ng/L — ABNORMAL HIGH (ref <18)
Troponin I (High Sensitivity): 17 ng/L (ref <18)

## 2022-04-06 LAB — RESP PANEL BY RT-PCR (RSV, FLU A&B, COVID)  RVPGX2
Influenza A by PCR: NEGATIVE
Influenza B by PCR: NEGATIVE
Resp Syncytial Virus by PCR: NEGATIVE
SARS Coronavirus 2 by RT PCR: NEGATIVE

## 2022-04-06 LAB — BRAIN NATRIURETIC PEPTIDE: B Natriuretic Peptide: 1089.3 pg/mL — ABNORMAL HIGH (ref 0.0–100.0)

## 2022-04-06 LAB — D-DIMER, QUANTITATIVE: D-Dimer, Quant: 0.27 ug/mL-FEU (ref 0.00–0.50)

## 2022-04-06 MED ORDER — IPRATROPIUM-ALBUTEROL 0.5-2.5 (3) MG/3ML IN SOLN
3.0000 mL | Freq: Once | RESPIRATORY_TRACT | Status: AC
  Administered 2022-04-06: 3 mL via RESPIRATORY_TRACT
  Filled 2022-04-06: qty 3

## 2022-04-06 MED ORDER — PREDNISONE 20 MG PO TABS
60.0000 mg | ORAL_TABLET | Freq: Once | ORAL | Status: AC
  Administered 2022-04-06: 60 mg via ORAL
  Filled 2022-04-06: qty 3

## 2022-04-06 MED ORDER — FUROSEMIDE 10 MG/ML IJ SOLN
60.0000 mg | INTRAMUSCULAR | Status: AC
  Administered 2022-04-06: 60 mg via INTRAVENOUS
  Filled 2022-04-06: qty 6

## 2022-04-06 MED ORDER — SODIUM CHLORIDE 0.9 % IV SOLN
1.0000 g | Freq: Once | INTRAVENOUS | Status: AC
  Administered 2022-04-06: 1 g via INTRAVENOUS
  Filled 2022-04-06: qty 10

## 2022-04-06 MED ORDER — AZITHROMYCIN 250 MG PO TABS: 250.0000 mg | ORAL_TABLET | Freq: Every day | ORAL | 0 refills | Status: DC

## 2022-04-06 MED ORDER — AZITHROMYCIN 250 MG PO TABS
500.0000 mg | ORAL_TABLET | ORAL | Status: AC
  Administered 2022-04-06: 500 mg via ORAL
  Filled 2022-04-06: qty 2

## 2022-04-06 MED ORDER — AZITHROMYCIN 250 MG PO TABS
250.0000 mg | ORAL_TABLET | Freq: Every day | ORAL | 0 refills | Status: DC
  Filled 2022-04-06: qty 6, 6d supply, fill #0

## 2022-04-06 MED ORDER — ALBUTEROL SULFATE HFA 108 (90 BASE) MCG/ACT IN AERS
2.0000 | INHALATION_SPRAY | RESPIRATORY_TRACT | Status: AC
  Administered 2022-04-06: 2 via RESPIRATORY_TRACT
  Filled 2022-04-06: qty 6.7

## 2022-04-06 MED ORDER — PREDNISONE 50 MG PO TABS: 50.0000 mg | ORAL_TABLET | Freq: Every day | ORAL | 0 refills | Status: AC

## 2022-04-06 MED ORDER — AMOXICILLIN 500 MG PO CAPS: 1000.0000 mg | ORAL_CAPSULE | Freq: Three times a day (TID) | ORAL | 0 refills | Status: AC

## 2022-04-06 NOTE — ED Notes (Signed)
Pt ambulated in room with oxygen saturation maintaining between 97-98% on room air

## 2022-04-06 NOTE — ED Provider Notes (Signed)
[facility] Provider Note   CSN: [id number] Arrival date & time: 04/06/22  1540     History  Chief Complaint  Patient presents with   Shortness of Breath    Alexandria Rhodes is a 47 y.o. female.  47 year old female with a history of nonischemic cardiomyopathy (EF of 30 to 35%) moderate mitral regurgitation, and provoked PE not on anticoagulation who presents to the emergency department with shortness of breath.  Patient states that over the past several days has been having progressively worsening dyspnea on exertion.  Reports that she also is now having shortness of breath when laying flat.  Denies any fevers but has had a cough productive of clear sputum.  Denies any significant lower extremity swelling or pain.  Has been compliant with her Lasix.  Denies any congestion or sore throat.  Has had occasional twinges of pain in her left chest that she says is worsened by movement.  Denies any radiation or vomiting.  Believes she weighs approximately 170 pounds now and that her dry weight is typically between 160-165 lbs. denies any history of asthma or COPD but does report several years of smoking.       Home Medications Prior to Admission medications   Medication Sig Start Date End Date Taking? Authorizing Provider  amoxicillin (AMOXIL) 500 MG capsule Take 2 capsules (1,000 mg total) by mouth 3 (three) times daily for 5 days. 04/06/22 04/11/22 Yes Fransico Meadow, MD  predniSONE (DELTASONE) 50 MG tablet Take 1 tablet (50 mg total) by mouth daily for 4 days. 04/06/22 04/10/22 Yes Fransico Meadow, MD  acetaminophen (TYLENOL) 500 MG tablet Take 1,000-1,500 mg by mouth 2 (two) times daily as needed for mild pain or headache.    [provider]  azithromycin (ZITHROMAX) 250 MG tablet Take 1 tablet (250 mg total) by mouth daily. Take first 2 tablets together, then 1 every day until finished. 04/06/22   Fransico Meadow, MD  dapagliflozin  propanediol (FARXIGA) 10 MG TABS tablet Take 1 tablet (10 mg total) by mouth daily. 07/11/21   Bensimhon, Shaune Pascal, MD  furosemide (LASIX) 40 MG tablet Take 1 tablet (40 mg total) by mouth daily. 06/05/21   Bensimhon, Shaune Pascal, MD  potassium chloride SA (KLOR-CON M) 20 MEQ tablet Take 1 tablet (20 mEq total) by mouth daily. 06/05/21   Bensimhon, Shaune Pascal, MD  spironolactone (ALDACTONE) 25 MG tablet Take 0.5 tablets (12.5 mg total) by mouth daily. 02/17/21   Bensimhon, Shaune Pascal, MD  SUMAtriptan (IMITREX) 50 MG tablet Take 50 mg by mouth every 2 (two) hours as needed for migraine. May repeat in 2 hours if headache persists or recurs.    [provider]      Allergies    Shellfish allergy    Review of Systems   Review of Systems  Physical Exam Updated Vital Signs BP (!) 128/95   Pulse 87   Temp 98.4 F (36.9 C)   Resp 10   Ht 5\' 1"  (1.549 m)   Wt 77.1 kg   LMP 03/27/2014 (Approximate)   SpO2 99%   BMI 32.12 kg/m  Physical Exam Vitals and nursing note reviewed.  Constitutional:      General: She is not in acute distress.    Appearance: She is well-developed.     Comments: Evaluated on room air with oxygen saturations of 92 to 93%.  Speaking in full sentences.  HENT:     Head: Normocephalic  and atraumatic.     Right Ear: External ear normal.     Left Ear: External ear normal.     Nose: Nose normal.  Eyes:     Extraocular Movements: Extraocular movements intact.     Conjunctiva/sclera: Conjunctivae normal.     Pupils: Pupils are equal, round, and reactive to light.  Cardiovascular:     Rate and Rhythm: Normal rate and regular rhythm.     Heart sounds: No murmur heard. Pulmonary:     Effort: Pulmonary effort is normal. No respiratory distress.     Breath sounds: Wheezing (Diffuse mild) present.  Musculoskeletal:     Cervical back: Normal range of motion and neck supple.     Right lower leg: No edema.     Left lower leg: No edema.  Skin:    General: Skin is warm and  dry.  Neurological:     Mental Status: She is alert and oriented to person, place, and time. Mental status is at baseline.  Psychiatric:        Mood and Affect: Mood normal.     ED Results / Procedures / Treatments   Labs (all labs ordered are listed, but only abnormal results are displayed) Labs Reviewed  BRAIN NATRIURETIC PEPTIDE - Abnormal; Notable for the following components:      Result Value   B Natriuretic Peptide 1,089.3 (*)    All other components within normal limits  COMPREHENSIVE METABOLIC PANEL - Abnormal; Notable for the following components:   CO2 20 (*)    Calcium 8.8 (*)    Albumin 3.4 (*)    All other components within normal limits  CBC - Abnormal; Notable for the following components:   RBC 3.42 (*)    Hemoglobin 10.1 (*)    HCT 32.4 (*)    All other components within normal limits  TROPONIN I (HIGH SENSITIVITY) - Abnormal; Notable for the following components:   Troponin I (High Sensitivity) 21 (*)    All other components within normal limits  RESP PANEL BY RT-PCR (RSV, FLU A&B, COVID)  RVPGX2  D-DIMER, QUANTITATIVE  TROPONIN I (HIGH SENSITIVITY)    EKG EKG Interpretation  Date/Time:  Monday April 06 2022 09:34:07 EST Ventricular Rate:  99 PR Interval:  188 QRS Duration: 114 QT Interval:  361 QTC Calculation: 464 R Axis:   49 Text Interpretation: Sinus rhythm Probable left atrial enlargement LVH with IVCD and secondary repol abnrm Confirmed by Margaretmary Eddy [phone]) on 04/06/2022 10:56:50 AM  Radiology DG Chest 2 View  Result Date: 04/06/2022 CLINICAL DATA:  Shortness of breath EXAM: CHEST - 2 VIEW COMPARISON:  CXR 02/26/22 FINDINGS: No pleural effusion. No pneumothorax. Hazy opacity in the right lung base nonspecific and could represent atelectasis or infection. Unchanged slightly enlarged cardiac contours. No displaced rib fractures. Visualized upper abdomen is unremarkable. IMPRESSION: Hazy opacity in the right lung base nonspecific and  could represent atelectasis or infection. Electronically Signed   By: Marin Roberts M.D.   On: 04/06/2022 10:12    Procedures Procedures    Medications Ordered in ED Medications  ipratropium-albuterol (DUONEB) 0.5-2.5 (3) MG/3ML nebulizer solution 3 mL (3 mLs Nebulization Given 04/06/22 1103)  cefTRIAXone (ROCEPHIN) 1 g in sodium chloride 0.9 % 100 mL IVPB (0 g Intravenous Stopped 04/06/22 1417)  azithromycin (ZITHROMAX) tablet 500 mg (500 mg Oral Given 04/06/22 1315)  furosemide (LASIX) injection 60 mg (60 mg Intravenous Given 04/06/22 1314)  albuterol (VENTOLIN HFA) 108 (90 Base) MCG/ACT inhaler 2 puff (  2 puffs Inhalation Given 04/06/22 1617)  predniSONE (DELTASONE) tablet 60 mg (60 mg Oral Given 04/06/22 1616)    ED Course/ Medical Decision Making/ A&P Clinical Course as of 04/06/22 2001  Mon Apr 06, 2022  1623 Oxygen stayed 97-98% while ambulating [RP]    Clinical Course User Index [RP] Fransico Meadow, MD                            Medical Decision Making Amount and/or Complexity of Data Reviewed Labs: ordered. Radiology: ordered.  Risk Prescription drug management.   Zakia Sainato is a 47 y.o. female with comorbidities that complicate the patient evaluation including prior tobacco use, nonischemic cardiomyopathy (EF of 30 to 35%) moderate mitral regurgitation, and provoked PE not on anticoagulation who presents to the emergency department with shortness of breath and wheezing on exam  Initial Ddx:  Undiagnosed reactive airway disease, URI, pneumonia, cardiac wheeze/heart failure exacerbation  MDM:  Appears the patient may have undiagnosed reactive airway disease.  May reflect COPD from her prior smoking.  Could also be new onset from a virus such as RSV.  Will give patient's nebulizer and see if this improves her symptoms.  Could also reflect a cardiac wheeze and she could be having a heart failure exacerbation.  With her history of pulmonary embolism and the fact that  she is not on anticoagulation we will also obtain D-dimer at this time.  Plan:  Labs BNP D-dimer COVID/flu DuoNeb trial Chest x-ray EKG  ED Summary/Re-evaluation:  Patient underwent the above workup which showed severely elevated BNP.  She was feeling better after the nebulizer so she was given prednisone as well as additional nebulizer treatment.  With her elevated BNP concerned that there is a component of heart failure exacerbation so was given Lasix with good urine output.  Chest x-ray did show right lower lobe haziness that was concerning for possible infection so was given ceftriaxone and azithromycin in the emergency department.  Patient was able to ambulate with walking pulse ox of 97%.  She is feeling well and preferred to go home.  Patient was given antibiotics for presumed pneumonia as well as steroids for her wheezing.  Also instructed to take her Lasix as prescribed and to follow-up with her primary doctor in several days.  Of note, did have marginally elevated troponin that was downtrending.  After talking to the patient feel that ACS is highly unlikely and that this is likely demand from the other processes at play at this time.  This patient presents to the ED for concern of complaints listed in HPI, this involves an extensive number of treatment options, and is a complaint that carries with it a high risk of complications and morbidity. Disposition including potential need for admission considered.   Dispo: DC Home. Return precautions discussed including, but not limited to, those listed in the AVS. Allowed pt time to ask questions which were answered fully prior to dc.  Additional history obtained from EMS Records reviewed Outpatient Clinic Notes The following labs were independently interpreted: Chemistry and show no acute abnormality I independently reviewed the following imaging with scope of interpretation limited to determining acute life threatening conditions related to  emergency care: Chest x-ray and agree with the radiologist interpretation with the following exceptions: None I personally reviewed and interpreted cardiac monitoring: normal sinus rhythm  I personally reviewed and interpreted the pt's EKG: see above for interpretation  I have reviewed the  patients home medications and made adjustments as needed  Final Clinical Impression(s) / ED Diagnoses Final diagnoses:  Wheezing  Pneumonia of right lower lobe due to infectious organism  Acute on chronic congestive heart failure, unspecified heart failure type [facility])    Rx / DC Orders ED Discharge Orders          Ordered    predniSONE (DELTASONE) 50 MG tablet  Daily        04/06/22 1653    amoxicillin (AMOXIL) 500 MG capsule  3 times daily        04/06/22 1653    azithromycin (ZITHROMAX) 250 MG tablet  Daily,   Status:  Discontinued        04/06/22 1653    azithromycin (ZITHROMAX) 250 MG tablet  Daily        04/06/22 1657              Rondel Baton, MD 04/06/22 2001

## 2022-04-06 NOTE — ED Notes (Addendum)
Spoken with Celesta Gentile about labs not being ran since 0930. Lab states they will run now.

## 2022-04-06 NOTE — ED Notes (Signed)
Spoke with lab, informed they will run labs now.

## 2022-04-06 NOTE — ED Triage Notes (Signed)
PT BIBGEMS for SOB for a few days progressing. Room air 90% EMS placed on 3LPM.  Rhonchi bilateral

## 2022-04-06 NOTE — Discharge Instructions (Signed)
You were seen for your shortness of breath in the emergency department.   At home, please take the antibiotics we have prescribed you for your possible pneumonia and the steroids and inhaler for your wheeezing.    Follow-up with your primary doctor in 2-3 days regarding your visit.  Please talk to them about your wheezing to see if you need to be tested for COPD or asthma.  Return immediately to the emergency department if you experience any of the following: Worsening shortness of breath, high fever, severe chest pains, or any other concerning symptoms.    Thank you for visiting our Emergency Department. It was a pleasure taking care of you today.

## 2022-04-06 NOTE — ED Notes (Signed)
Patient ambulating to the restroom with no apparent distress at this time.

## 2022-04-19 ENCOUNTER — Emergency Department (HOSPITAL_COMMUNITY): Payer: Commercial Managed Care - HMO

## 2022-04-19 ENCOUNTER — Encounter (HOSPITAL_COMMUNITY): Payer: Self-pay | Admitting: Emergency Medicine

## 2022-04-19 ENCOUNTER — Emergency Department (HOSPITAL_COMMUNITY)
Admission: EM | Admit: 2022-04-19 | Discharge: 2022-04-19 | Disposition: A | Discharge status: Home / Self Care | Payer: Commercial Managed Care - HMO | Attending: Emergency Medicine | Admitting: Emergency Medicine

## 2022-04-19 DIAGNOSIS — R0602 Shortness of breath: Secondary | ICD-10-CM | POA: Diagnosis not present

## 2022-04-19 DIAGNOSIS — R110 Nausea: Secondary | ICD-10-CM | POA: Insufficient documentation

## 2022-04-19 DIAGNOSIS — R109 Unspecified abdominal pain: Secondary | ICD-10-CM | POA: Diagnosis not present

## 2022-04-19 DIAGNOSIS — I509 Heart failure, unspecified: Secondary | ICD-10-CM | POA: Insufficient documentation

## 2022-04-19 DIAGNOSIS — R000 Tachycardia, unspecified: Secondary | ICD-10-CM | POA: Insufficient documentation

## 2022-04-19 DIAGNOSIS — I110 Hypertensive heart disease with heart failure: Secondary | ICD-10-CM | POA: Insufficient documentation

## 2022-04-19 DIAGNOSIS — R7989 Other specified abnormal findings of blood chemistry: Secondary | ICD-10-CM | POA: Insufficient documentation

## 2022-04-19 DIAGNOSIS — R072 Precordial pain: Secondary | ICD-10-CM | POA: Diagnosis present

## 2022-04-19 DIAGNOSIS — R079 Chest pain, unspecified: Secondary | ICD-10-CM

## 2022-04-19 LAB — BASIC METABOLIC PANEL
Anion gap: 11 (ref 5–15)
BUN: 11 mg/dL (ref 6–20)
CO2: 21 mmol/L — ABNORMAL LOW (ref 22–32)
Calcium: 9.2 mg/dL (ref 8.9–10.3)
Chloride: 106 mmol/L (ref 98–111)
Creatinine, Ser: 0.97 mg/dL (ref 0.44–1.00)
GFR, Estimated: >60 mL/min (ref >60)
Glucose, Bld: 98 mg/dL (ref 70–99)
Potassium: 3.8 mmol/L (ref 3.5–5.1)
Sodium: 138 mmol/L (ref 135–145)

## 2022-04-19 LAB — CBC WITH DIFFERENTIAL/PLATELET
Abs Immature Granulocytes: 0.02 10*3/uL (ref 0.00–0.07)
Basophils Absolute: 0 10*3/uL (ref 0.0–0.1)
Basophils Relative: 0 %
Eosinophils Absolute: 0.1 10*3/uL (ref 0.0–0.5)
Eosinophils Relative: 2 %
HCT: 39.2 % (ref 36.0–46.0)
Hemoglobin: 12.3 g/dL (ref 12.0–15.0)
Immature Granulocytes: 0 %
Lymphocytes Relative: 28 %
Lymphs Abs: 2 10*3/uL (ref 0.7–4.0)
MCH: 28.9 pg (ref 26.0–34.0)
MCHC: 31.4 g/dL (ref 30.0–36.0)
MCV: 92 fL (ref 80.0–100.0)
Monocytes Absolute: 0.4 10*3/uL (ref 0.1–1.0)
Monocytes Relative: 6 %
Neutro Abs: 4.5 10*3/uL (ref 1.7–7.7)
Neutrophils Relative %: 64 %
Platelets: 270 10*3/uL (ref 150–400)
RBC: 4.26 MIL/uL (ref 3.87–5.11)
RDW: 14.4 % (ref 11.5–15.5)
WBC: 7 10*3/uL (ref 4.0–10.5)
nRBC: 0 % (ref 0.0–0.2)

## 2022-04-19 LAB — BRAIN NATRIURETIC PEPTIDE: B Natriuretic Peptide: 1171.4 pg/mL — ABNORMAL HIGH (ref 0.0–100.0)

## 2022-04-19 LAB — TROPONIN I (HIGH SENSITIVITY)
Troponin I (High Sensitivity): 27 ng/L — ABNORMAL HIGH (ref <18)
Troponin I (High Sensitivity): 32 ng/L — ABNORMAL HIGH (ref <18)

## 2022-04-19 MED ORDER — ONDANSETRON HCL 4 MG/2ML IJ SOLN: 4.0000 mg | Freq: Once | INTRAMUSCULAR | Status: DC | PRN

## 2022-04-19 MED ORDER — FENTANYL CITRATE PF 50 MCG/ML IJ SOSY
50.0000 ug | PREFILLED_SYRINGE | Freq: Once | INTRAMUSCULAR | Status: AC
  Administered 2022-04-19: 50 ug via INTRAVENOUS
  Filled 2022-04-19: qty 1

## 2022-04-19 MED ORDER — FUROSEMIDE 10 MG/ML IJ SOLN
40.0000 mg | Freq: Once | INTRAMUSCULAR | Status: AC
  Administered 2022-04-19: 40 mg via INTRAVENOUS
  Filled 2022-04-19: qty 4

## 2022-04-19 MED ORDER — DEXAMETHASONE SODIUM PHOSPHATE 10 MG/ML IJ SOLN
10.0000 mg | Freq: Once | INTRAMUSCULAR | Status: AC
  Administered 2022-04-19: 10 mg via INTRAVENOUS
  Filled 2022-04-19: qty 1

## 2022-04-19 MED ORDER — MORPHINE SULFATE (PF) 4 MG/ML IV SOLN
4.0000 mg | Freq: Once | INTRAVENOUS | Status: AC
  Administered 2022-04-19: 4 mg via INTRAVENOUS
  Filled 2022-04-19: qty 1

## 2022-04-19 MED ORDER — IOHEXOL 350 MG/ML SOLN
75.0000 mL | Freq: Once | INTRAVENOUS | Status: AC | PRN
  Administered 2022-04-19: 75 mL via INTRAVENOUS

## 2022-04-19 MED ORDER — FUROSEMIDE 40 MG PO TABS
40.0000 mg | ORAL_TABLET | Freq: Every day | ORAL | 0 refills | Status: DC
  Filled 2022-04-19: qty 30, 30d supply, fill #0

## 2022-04-19 NOTE — ED Provider Notes (Signed)
[facility] Provider Note   CSN: [id number] Arrival date & time: 04/19/22  1544     History  Chief Complaint  Patient presents with   Flank Pain    Alexandria Rhodes is a 47 y.o. female.  Patient presents emergency department complaining of left-sided flank pain that began while at work on Thursday.  She states that the pain started suddenly with no aggravating or alleviating factors.  Pain is described as sharp.  She denies any urinary symptoms at this time.  She does endorse some mild nausea.  The patient also complains of substernal chest pain and intermittent shortness of breath.  The pain is described as sharp when it is occurring.  Patient noted to be tachycardic upon my assessment with rates between 110 and 120.  Past medical history includes unprovoked pulmonary embolism, not currently on anticoagulation, congestive heart failure, hypertension  HPI     Home Medications Prior to Admission medications   Medication Sig Start Date End Date Taking? Authorizing Provider  acetaminophen (TYLENOL) 500 MG tablet Take 1,000-1,500 mg by mouth 2 (two) times daily as needed for mild pain or headache.    [provider]  azithromycin (ZITHROMAX) 250 MG tablet Take 1 tablet (250 mg total) by mouth daily. Take first 2 tablets together, then 1 every day until finished. 04/06/22   Fransico Meadow, MD  dapagliflozin propanediol (FARXIGA) 10 MG TABS tablet Take 1 tablet (10 mg total) by mouth daily. 07/11/21   Bensimhon, Shaune Pascal, MD  furosemide (LASIX) 40 MG tablet Take 1 tablet (40 mg total) by mouth daily. 06/05/21   Bensimhon, Shaune Pascal, MD  potassium chloride SA (KLOR-CON M) 20 MEQ tablet Take 1 tablet (20 mEq total) by mouth daily. 06/05/21   Bensimhon, Shaune Pascal, MD  spironolactone (ALDACTONE) 25 MG tablet Take 0.5 tablets (12.5 mg total) by mouth daily. 02/17/21   Bensimhon, Shaune Pascal, MD  SUMAtriptan (IMITREX) 50 MG tablet Take 50 mg by mouth  every 2 (two) hours as needed for migraine. May repeat in 2 hours if headache persists or recurs.    [provider]      Allergies    Shellfish allergy    Review of Systems   Review of Systems  Constitutional:  Negative for fever.  Respiratory:  Positive for shortness of breath.   Cardiovascular:  Positive for chest pain.  Gastrointestinal:  Positive for nausea. Negative for constipation, diarrhea and vomiting.  Genitourinary:  Positive for flank pain. Negative for dysuria and hematuria.  Neurological:  Negative for headaches.    Physical Exam Updated Vital Signs BP (!) 144/99 (BP Location: Right Arm)   Pulse 92   Temp 98.7 F (37.1 C)   Resp 15   LMP 03/27/2014 (Approximate)   SpO2 99%  Physical Exam Vitals and nursing note reviewed.  Constitutional:      General: She is not in acute distress.    Appearance: She is well-developed.  HENT:     Head: Normocephalic and atraumatic.     Mouth/Throat:     Mouth: Mucous membranes are moist.  Eyes:     Conjunctiva/sclera: Conjunctivae normal.  Cardiovascular:     Rate and Rhythm: Regular rhythm. Tachycardia present.     Heart sounds: No murmur heard. Pulmonary:     Effort: Pulmonary effort is normal. No respiratory distress.     Breath sounds: Normal breath sounds.  Abdominal:     Palpations: Abdomen is soft.  Tenderness: There is no abdominal tenderness. There is left CVA tenderness (Mild).  Musculoskeletal:        General: No swelling or tenderness.     Cervical back: Neck supple.  Skin:    General: Skin is warm and dry.     Capillary Refill: Capillary refill takes less than 2 seconds.  Neurological:     Mental Status: She is alert.  Psychiatric:        Mood and Affect: Mood normal.    ED Results / Procedures / Treatments   Labs (all labs ordered are listed, but only abnormal results are displayed) Labs Reviewed  BASIC METABOLIC PANEL  CBC WITH DIFFERENTIAL/PLATELET  BRAIN NATRIURETIC PEPTIDE   TROPONIN I (HIGH SENSITIVITY)    EKG None  Radiology No results found.  Procedures Procedures    Medications Ordered in ED Medications  morphine (PF) 4 MG/ML injection 4 mg (has no administration in time range)  ondansetron (ZOFRAN) injection 4 mg (has no administration in time range)    ED Course/ Medical Decision Making/ A&P                             Medical Decision Making Amount and/or Complexity of Data Reviewed Labs: ordered. Radiology: ordered.  Risk Prescription drug management.   This patient presents to the ED for concern of multiple complaints including flank pain, nausea, chest pain, shortness of breath, this involves an extensive number of treatment options, and is a complaint that carries with it a high risk of complications and morbidity.  The differential diagnosis for the chest pain and shortness of breath includes CHF exacerbation, pulmonary embolism, ACS, others.  The differential diagnosis for the flank pain includes musculoskeletal pain, nephrolithiasis, hydronephrosis, pyelonephritis, others   Co morbidities that complicate the patient evaluation  History of congestive heart failure, previous pulmonary embolism   Additional history obtained:   External records from outside source obtained and reviewed including emergency room notes from January 22 when the patient was diagnosed with a right-sided pneumonia, wheezing.  Emergency department notes from December 14 where the patient was evaluated for atypical chest pain.   Lab Tests:  I Ordered, and personally interpreted labs.  The pertinent results include: BNP 1171.4, initial troponin 27, repeat 32; unremarkable CBC; unremarkable BMP   Imaging Studies ordered:  I ordered imaging studies including chest x-ray, CT angio chest PE study, CT abdomen pelvis with contrast I independently visualized and interpreted imaging which showed cardiac enlargement on chest x-ray with no acute  findings. 1. No filling defect is identified in the pulmonary arterial tree to  suggest pulmonary embolus.  2. Moderate cardiomegaly.  3. Mosaic attenuation in the lungs compatible with air trapping.  4. Mild dorsal pancreatic duct dilatation in the pancreatic body and  head up to 0.4 cm in diameter, no specific cause identified.  5. Degenerative facet arthropathy on the right at L4-5, without  overt impingement.   I agree with the radiologist interpretation   Cardiac Monitoring: / EKG:  The patient was maintained on a cardiac monitor.  I personally viewed and interpreted the cardiac monitored which showed an underlying rhythm of: Sinus   Problem List / ED Course / Critical interventions / Medication management   I ordered medication including morphine and fentanyl for pain.  Lasix for diuresis, Decadron for inflammation Reevaluation of the patient after these medicines showed that the patient improved I have reviewed the patients home medicines and  have made adjustments as needed    Test / Admission - Considered:  There was no PE on the CT angio PE study.  No pneumonia noted.  No acute abdominal finding to explain the flank pain.  Lab work was grossly unremarkable other than the elevated BNP.  Troponins were mildly elevated, likely due to patient's underlying CHF and fluid overload.  Very low clinical suspicion at this time of ACS.  Patient has follow-up scheduled in 2 weeks with cardiology. Lasix refill provided. Musculoskeletal pain high on differential for flank/back pain        Final Clinical Impression(s) / ED Diagnoses Final diagnoses:  None    Rx / DC Orders ED Discharge Orders     None         Ronny Bacon 04/19/22 2006    Regan Lemming, MD 04/21/22 [phone]

## 2022-04-19 NOTE — Discharge Instructions (Addendum)
You were evaluated today for left-sided flank pain.  No emergent cause was found on your imaging or with your lab work.  This seems most likely be some sort of musculoskeletal pain.  Please take over-the-counter medications at home for pain control.  Please continue to use ice and heat if you find it beneficial.  You were administered a steroid today in the emergency department to help with inflammation. Your BNP was elevated today which could show some signs of mild fluid overload.  You were given IV Lasix.  I have prescribed a refill of her Lasix for the next 30 days.  Please follow-up with your cardiologist at your upcoming appointment for further evaluation, management, and refills. If you develop any life-threatening symptoms please return to the emergency room.

## 2022-04-19 NOTE — ED Triage Notes (Signed)
Pt reports left sided pain for 3 days that feels like a pressure. Pain to left flank area that radiates to abd.

## 2022-04-20 ENCOUNTER — Other Ambulatory Visit (HOSPITAL_COMMUNITY): Payer: Self-pay

## 2022-04-20 ENCOUNTER — Other Ambulatory Visit: Payer: Self-pay

## 2022-04-27 NOTE — Progress Notes (Signed)
ADVANCED HEART FAILURE CLINIC NOTE  PCP: Kathe Becton FNP  HF Cardiologist: Dr Haroldine Laws   Reason for visit: Heart Failure   HPI: Alexandria Rhodes is a 47 y.o. female with a history of non-ischemic cardiomyopathy with EF as low as 10% in 2016 (had LifeVest) but EF 40-45% on Echo in 1/22, prior PE in 03/2018 no longer on anticoagulation, migraine, and hypertension.  She was admitted to a hospital in [place] in 08/2014 after presenting with CP and SOB.  She was found to have an EF of 10%. She states she underwent heart cath which showed no CAD. She was followed by [facility]. D[facility]en P[facility] [place].  She was started on GDMT and was on Entresto 97-103 mg bid,  Coreg 6.25 mg bid and Spironolactone 12.5 mg daily.  She moved to [place] at the end of 2019.  Admitted to [facility] in 03/2018 with CP and found to have an acute PE felt to be provoked by recent move from [place]. Echo showed LVEF of 65-70% with normal wall motion, grade 1 DD, normal RV function, and dilated IVC.  She was started on Eliquis but was only able to complete 1 month of therapy due to cost.  She also could not afford her HF medications and has not been on anything x 1 year.  Admitted to [facility] on 5/21 with A/C CHF,  had been out of her medications for a week. Diuresed with IV lasix and started on HF meds. D/C weight 156 pounds. Given all medications from HF fund at discharge. Referred to HF social work to help w/ meds and transportation to and from appts.   cMRI 5/21 LVEF 29%, diffuse HK, paradoxical septal motion, midwall LGE, non-specific sign consistent with NICM, significant trabeculations in basal and mid myocardial segments.  08/2019 digoxin stopped due to rash and improved symptoms. She canceled all follow up appointments and did not reschedule.   Follow up 11/22 she had been out of her medicines x 8 months. Volume up, NYHA II-III. GDMT restarted & enrolled in Paramedicine.  Echo 11/22: EF 30-35%, LV  moderately decreased, global HK, RV ok, mild to moderate MR  Today she returns for HF follow up.Paramedicine no longer following as she was doing well. Still working FT at [employer]. Denies SOB, orthopnea, PND or edema. Out of Wilder Glade and Delene Loll due to lack of coverage for 1 months  Echo today 07/11/21: EF 25-30% (read as 30-35%) moderate MR G2DD   Cardiac Studies: - Echo 11/22: EF 30-35%, LV moderately decreased, global HK, RV ok, mild to moderate MR  - Echo 04/10/20: EF 40-45%  - Echo 07/2019: EF 20-25% Grade II DD  - cMRI 07/2019: LVEF 29%, diffuse HK, paradoxical septal motion, midwall LGE, non-specific sign consistent with NICM, significant trabeculations in basal and mid myocardial segments.  - Echo 03/2018: EF 65-70%   ROS: All systems negative except as listed in HPI, PMH and Problem List.  SH:  Social History   Socioeconomic History   Marital status: Single    Spouse name: Not on file   Number of children: Not on file   Years of education: Not on file   Highest education level: Not on file  Occupational History   Not on file  Tobacco Use   Smoking status: Former   Smokeless tobacco: Never  Vaping Use   Vaping Use: Never used  Substance and Sexual Activity   Alcohol use: Not Currently   Drug use: Not  Currently   Sexual activity: Yes  Other Topics Concern   Not on file  Social History Narrative   Not on file   Social Determinants of Health   Financial Resource Strain: High Risk (06/13/2021)   Overall Financial Resource Strain (CARDIA)    Difficulty of Paying Living Expenses: Very hard  Food Insecurity: Food Insecurity Present (02/17/2021)   Hunger Vital Sign    Worried About Running Out of Food in the Last Year: Sometimes true    Ran Out of Food in the Last Year: Sometimes true  Transportation Needs: Unmet Transportation Needs (01/24/2021)   PRAPARE - Hydrologist (Medical): Yes    Lack of Transportation (Non-Medical): Yes   Physical Activity: Unknown (03/28/2018)   Exercise Vital Sign    Days of Exercise per Week: Patient refused    Minutes of Exercise per Session: Patient refused  Stress: No Stress Concern Present (03/28/2018)   [place] of Stress : Not at all  Social Connections: Unknown (03/28/2018)   Social Connection and Isolation Panel [NHANES]    Frequency of Communication with Friends and Family: Patient refused    Frequency of Social Gatherings with Friends and Family: Patient refused    Attends Religious Services: Patient refused    Active Member of Clubs or Organizations: Patient refused    Attends Archivist Meetings: Patient refused    Marital Status: Patient refused  Intimate Partner Violence: Unknown (03/28/2018)   Humiliation, Afraid, Rape, and Kick questionnaire    Fear of Current or Ex-Partner: Patient refused    Emotionally Abused: Patient refused    Physically Abused: Patient refused    Sexually Abused: Patient refused   FH:  Family History  Problem Relation Age of Onset   Hypertension Mother    Past Medical History:  Diagnosis Date   Acute pulmonary embolism ([place])    Cardiomyopathy ([place])    CHF (congestive heart failure) ([place])    Hypertension    Non-ischemic cardiomyopathy ([place]) 07/2019   Current Outpatient Medications  Medication Sig Dispense Refill   acetaminophen (TYLENOL) 500 MG tablet Take 1,000-1,500 mg by mouth 2 (two) times daily as needed for mild pain or headache.     azithromycin (ZITHROMAX) 250 MG tablet Take 1 tablet (250 mg total) by mouth daily. Take first 2 tablets together, then 1 every day until finished. 6 tablet 0   dapagliflozin propanediol (FARXIGA) 10 MG TABS tablet Take 1 tablet (10 mg total) by mouth daily. 30 tablet 3   furosemide (LASIX) 40 MG tablet Take 1 tablet (40 mg total) by mouth daily. 30 tablet 0   potassium chloride SA (KLOR-CON M) 20 MEQ tablet Take 1  tablet (20 mEq total) by mouth daily. 30 tablet 3   spironolactone (ALDACTONE) 25 MG tablet Take 0.5 tablets (12.5 mg total) by mouth daily. 15 tablet 5   SUMAtriptan (IMITREX) 50 MG tablet Take 50 mg by mouth every 2 (two) hours as needed for migraine. May repeat in 2 hours if headache persists or recurs.     No current facility-administered medications for this visit.   LMP 03/27/2014 (Approximate)   Wt Readings from Last 3 Encounters:  04/06/22 77.1 kg (170 lb)  07/11/21 76.3 kg (168 lb 3.2 oz)  04/21/21 72.6 kg (160 lb)   PHYSICAL EXAM: General:  Well appearing. No resp difficulty HEENT: normal Neck: supple. JVP hard to see looks elevated Carotids 2+  bilat; no bruits. No lymphadenopathy or thryomegaly appreciated. Cor: PMI nondisplaced. Regular rate & rhythm. 2/6 MR Lungs: clear Abdomen: soft, nontender, nondistended. No hepatosplenomegaly. No bruits or masses. Good bowel sounds. Extremities: no cyanosis, clubbing, rash, edema Neuro: alert & orientedx3, cranial nerves grossly intact. moves all 4 extremities w/o difficulty. Affect pleasant   ECG: SB 41 bpm, 1st degree AVB PR 210 +LVH with repol  ReDs: 29%  ASSESSMENT & PLAN:  1. 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  She was started on guideline based medications and EF improved to 65-70% on echo in 03/2018.   - Echo 5/21 EF back down to 20-25% (in the setting of poor med compliance) with global hypokinesis, grade II diastolic dysfunction, severely dilated left atrium, moderate MR - cMRI 5/21 EF 29% minimal LGE. + non-compaction. Severe LAE. Mod-severe MR. Normal RV - Echo 04/10/20 EF 40-45%  - Echo 11/22 EF 30-35% - Echo today 07/11/21: EF 25-30% (read as 30-35%) moderate MR G2DD  - Stable NYHA II. Volume status looks a bit elevated. REDs ok at 29% - Off Entresto due to coverage. BP too low to restart - Off Farxiga due to coverage. Will  restart - Continue spironolactone 12.5 mg daily. - Continue Lasix 40 mg daily + KCL 20 daily.  - Off coreg due to hypotension/bradycardia. HR 46 today.  - She is off digoxin given previous rash and improvement of EF. Will not re-challenge today with HR 46 - EF not improving. I worry she will be heading toward advanced therapies down the road. Long talk about need for better med compliance. Will get CPX to further risk stratify.  - Need to consider ICD at next visit  2. Bradycardia - no evidence of infiltrative disease on cMRI - [facility]io 1/23  1. Sinus rhythm - avg HR of 84 bpm.  2. Rare PACs and PVCs  3. HTN - BP now running low  4. History of PE - Provoked in the setting of prolonged travel. - Treated w/ Eliquis. No longer on a/c.  5. Non-compliance - Discussion as above.    Rafael Bihari, FNP  8:26 AM

## 2022-04-28 ENCOUNTER — Other Ambulatory Visit (HOSPITAL_COMMUNITY): Payer: Self-pay

## 2022-04-28 ENCOUNTER — Ambulatory Visit (HOSPITAL_COMMUNITY)
Admission: RE | Admit: 2022-04-28 | Discharge: 2022-04-28 | Disposition: A | Discharge status: Home / Self Care | Payer: Commercial Managed Care - HMO | Source: Ambulatory Visit | Attending: Family Medicine | Admitting: Family Medicine

## 2022-04-28 ENCOUNTER — Encounter (HOSPITAL_COMMUNITY): Payer: Self-pay

## 2022-04-28 VITALS — BP 110/78 | HR 51 | Wt 171.6 lb

## 2022-04-28 DIAGNOSIS — Z7984 Long term (current) use of oral hypoglycemic drugs: Secondary | ICD-10-CM | POA: Insufficient documentation

## 2022-04-28 DIAGNOSIS — R001 Bradycardia, unspecified: Secondary | ICD-10-CM | POA: Diagnosis not present

## 2022-04-28 DIAGNOSIS — I110 Hypertensive heart disease with heart failure: Secondary | ICD-10-CM | POA: Insufficient documentation

## 2022-04-28 DIAGNOSIS — I10 Essential (primary) hypertension: Secondary | ICD-10-CM | POA: Diagnosis not present

## 2022-04-28 DIAGNOSIS — Z79899 Other long term (current) drug therapy: Secondary | ICD-10-CM | POA: Insufficient documentation

## 2022-04-28 DIAGNOSIS — I5042 Chronic combined systolic (congestive) and diastolic (congestive) heart failure: Secondary | ICD-10-CM | POA: Insufficient documentation

## 2022-04-28 DIAGNOSIS — R21 Rash and other nonspecific skin eruption: Secondary | ICD-10-CM | POA: Diagnosis not present

## 2022-04-28 DIAGNOSIS — Z91148 Patient's other noncompliance with medication regimen for other reason: Secondary | ICD-10-CM | POA: Diagnosis not present

## 2022-04-28 DIAGNOSIS — I428 Other cardiomyopathies: Secondary | ICD-10-CM | POA: Diagnosis not present

## 2022-04-28 DIAGNOSIS — I5022 Chronic systolic (congestive) heart failure: Secondary | ICD-10-CM | POA: Diagnosis not present

## 2022-04-28 DIAGNOSIS — R0683 Snoring: Secondary | ICD-10-CM | POA: Diagnosis not present

## 2022-04-28 DIAGNOSIS — Z86711 Personal history of pulmonary embolism: Secondary | ICD-10-CM | POA: Diagnosis not present

## 2022-04-28 LAB — BASIC METABOLIC PANEL
Anion gap: 5 (ref 5–15)
BUN: 12 mg/dL (ref 6–20)
CO2: 25 mmol/L (ref 22–32)
Calcium: 8.7 mg/dL — ABNORMAL LOW (ref 8.9–10.3)
Chloride: 110 mmol/L (ref 98–111)
Creatinine, Ser: 0.88 mg/dL (ref 0.44–1.00)
GFR, Estimated: >60 mL/min (ref >60)
Glucose, Bld: 100 mg/dL — ABNORMAL HIGH (ref 70–99)
Potassium: 4.3 mmol/L (ref 3.5–5.1)
Sodium: 140 mmol/L (ref 135–145)

## 2022-04-28 LAB — BRAIN NATRIURETIC PEPTIDE: B Natriuretic Peptide: 283 pg/mL — ABNORMAL HIGH (ref 0.0–100.0)

## 2022-04-28 NOTE — Progress Notes (Signed)
ReDS Vest / Clip - 04/28/22 1500       ReDS Vest / Clip   Station Marker A    Ruler Value 30    ReDS Value Range Moderate volume overload    ReDS Actual Value 37    Anatomical Comments sitting

## 2022-04-28 NOTE — Patient Instructions (Addendum)
Thank you for coming in today  Labs were done today, if any labs are abnormal the clinic will call you No news is good news  CPX testing will be scheduled for you, you have been given instruction sheet  RESTART Farxiga 10 mg 1 tablet day  Your physician recommends that you schedule a follow-up appointment in:  6 weeks with Dr. Haroldine Laws You will receive a reminder letter in the mail a few months in advance. If you don't receive a letter, please call our office to schedule the follow-up appointment.  You have been given instructions for [place] sleep home study   Do the following things EVERYDAY: Weigh yourself in the morning before breakfast. Write it down and keep it in a log. Take your medicines as prescribed Eat low salt foods--Limit salt (sodium) to 2000 mg per day.  Stay as active as you can everyday Limit all fluids for the day to less than 2 liters  At the [facility], you and your health needs are our priority. As part of our continuing mission to provide you with exceptional heart care, we have created designated Provider Care Teams. These Care Teams include your primary Cardiologist (physician) and Advanced Practice Providers (APPs- Physician Assistants and Nurse Practitioners) who all work together to provide you with the care you need, when you need it.   You may see any of the following providers on your designated Care Team at your next follow up: Dr Glori Bickers Dr Loralie Champagne Dr. Roxana Hires, NP Lyda Jester, [place] [facility] [place], [place] Forestine Na, NP Audry Riles, PharmD   Please be sure to bring in all your medications bottles to every appointment.    Thank you for choosing [facility]  If you have any questions or concerns before your next appointment please send [place] a message through [place] or call our office at [phone].    TO LEAVE A MESSAGE FOR THE NURSE  SELECT OPTION 2, PLEASE LEAVE A MESSAGE INCLUDING: YOUR NAME DATE OF BIRTH CALL BACK NUMBER REASON FOR CALL**this is important as we prioritize the call backs  YOU WILL RECEIVE A CALL BACK THE SAME DAY AS LONG AS YOU CALL BEFORE 4:00 PM

## 2022-04-28 NOTE — Progress Notes (Signed)
Patient Name: Alexandria Rhodes        DOB: 1975/09/10      Height: 171 lbs     Weight:  Office Name: Advance Heart Failure          Referring Provider: Allena Katz NP  Today's Date: 04/28/2022   Date: 04/28/2022    STOP BANG RISK ASSESSMENT S (snore) Have you been told that you snore?     YES   T (tired) Are you often tired, fatigued, or sleepy during the day?   YES  O (obstruction) Do you stop breathing, choke, or gasp during sleep? NO   P (pressure) Do you have or are you being treated for high blood pressure? YES   B (BMI) Is your body index greater than 35 kg/m? NO   A (age) Are you 38 years old or older? NO   N (neck) Do you have a neck circumference greater than 16 inches?   NO   G (gender) Are you a female? NO   TOTAL STOP/BANG "YES" ANSWERS 3                                                                       For Office Use Only              Procedure Order Form    YES to 3+ Stop Bang questions OR two clinical symptoms - patient qualifies for WatchPAT (CPT 95800)             Clinical Notes: Will consult Sleep Specialist and refer for management of therapy due to patient increased risk of Sleep Apnea. Ordering a sleep study due to the following two clinical symptoms: Excessive daytime sleepiness G47.10 / Gastroesophageal reflux K21.9 / Nocturia R35.1 / Morning Headaches G44.221 / Difficulty concentrating R41.840 / Memory problems or poor judgment G31.84 / Personality changes or irritability R45.4 / Loud snoring R06.83 / Depression F32.9 / Unrefreshed by sleep G47.8 / Impotence N52.9 / History of high blood pressure R03.0 / Insomnia G47.00    I understand that I am proceeding with a home sleep apnea test as ordered by my treating physician. I understand that untreated sleep apnea is a serious cardiovascular risk factor and it is my responsibility to perform the test and seek management for sleep apnea. I will be contacted with the results and be managed for sleep  apnea by a local sleep physician. I will be receiving equipment and further instructions from [facility]. I shall promptly ship back the equipment via the included mailing label. I understand my insurance will be billed for the test and as the patient I am responsible for any insurance related out-of-pocket costs incurred. I have been provided with written instructions and can call for additional video or telephonic instruction, with 24-hour availability of qualified personnel to answer any questions: Patient Help Desk [phone].  Patient Signature ______________________________________________________   Date______________________ Patient Telemedicine Verbal Consent

## 2022-04-29 ENCOUNTER — Other Ambulatory Visit: Payer: Self-pay

## 2022-04-29 ENCOUNTER — Telehealth (HOSPITAL_COMMUNITY): Payer: Self-pay | Admitting: Surgery

## 2022-04-29 NOTE — Telephone Encounter (Signed)
I called patient to inform her that she can proceed with completing the ordered home sleep test as insurance pre-cert is not required.  She tells me that she will attempt to do it within the next few nights.

## 2022-04-30 ENCOUNTER — Other Ambulatory Visit (HOSPITAL_COMMUNITY): Payer: Self-pay

## 2022-04-30 ENCOUNTER — Encounter (INDEPENDENT_AMBULATORY_CARE_PROVIDER_SITE_OTHER): Payer: Commercial Managed Care - HMO | Admitting: Cardiology

## 2022-04-30 ENCOUNTER — Other Ambulatory Visit (HOSPITAL_COMMUNITY): Payer: Self-pay | Admitting: Internal Medicine

## 2022-04-30 DIAGNOSIS — G4733 Obstructive sleep apnea (adult) (pediatric): Secondary | ICD-10-CM | POA: Diagnosis not present

## 2022-04-30 DIAGNOSIS — R0683 Snoring: Secondary | ICD-10-CM

## 2022-04-30 MED ORDER — SPIRONOLACTONE 25 MG PO TABS
12.5000 mg | ORAL_TABLET | Freq: Every day | ORAL | 5 refills | Status: DC
  Filled 2022-04-30: qty 15, 30d supply, fill #0
  Filled 2022-06-12: qty 15, 30d supply, fill #1
  Filled 2022-07-14: qty 15, 30d supply, fill #2
  Filled 2022-08-18 – 2022-09-04 (×2): qty 15, 30d supply, fill #3
  Filled 2022-10-18: qty 15, 30d supply, fill #4

## 2022-04-30 MED ORDER — POTASSIUM CHLORIDE CRYS ER 20 MEQ PO TBCR
20.0000 meq | EXTENDED_RELEASE_TABLET | Freq: Every day | ORAL | 3 refills | Status: DC
  Filled 2022-04-30: qty 30, 30d supply, fill #0
  Filled 2022-06-12: qty 30, 30d supply, fill #1
  Filled 2022-07-14: qty 30, 30d supply, fill #2
  Filled 2022-08-18 – 2022-09-04 (×2): qty 30, 30d supply, fill #3

## 2022-05-02 ENCOUNTER — Ambulatory Visit: Payer: Commercial Managed Care - HMO | Attending: Family Medicine

## 2022-05-02 NOTE — Procedures (Signed)
SLEEP STUDY REPORT Patient Information Study Date: 04/30/2022 Patient Name: Alexandria Rhodes Patient ID: [id number] Birth Date: 03-21-1975 Age: 47 Gender: Female BMI: 32.5 (W=172 lb, H=5' 1'') Stopbang: 3 Referring Physician: Allena Katz, NP  TEST DESCRIPTION: Home sleep apnea testing was completed using the WatchPat, a Type 1 device, utilizing peripheral arterial tonometry (PAT), chest movement, actigraphy, pulse oximetry, pulse rate, body position and snore. AHI was calculated with apnea and hypopnea using valid sleep time as the denominator. RDI includes apneas, hypopneas, and RERAs. The data acquired and the scoring of sleep and all associated events were performed in accordance with the recommended standards and specifications as outlined in the AASM Manual for the Scoring of Sleep and Associated Events 2.2.0 (2015).   FINDINGS:   1. Mild Obstructive Sleep Apnea with AHI 6.8/hr overall but moderate during REM sleep with AHI 14.4/hr.   2. No Central Sleep Apnea with pAHIc 0/hr.   3. Oxygen desaturations as low as 86%.   4. Mild snoring was present. O2 sats were < 88% for 0.3 min.   5. Total sleep time was 5 hrs and 8 min.   6. 28.7% of total sleep time was spent in REM sleep.   7. Normal sleep onset latency at 10 min.   8. Shortened REM sleep onset latency at 57 min.   9. Total awakenings were 7.  10. Arrhythmia detection:  Suggestive of possible brief atrial fibrillation lasting 1 min and 46 seconds.  This is not diagnostic and further testing with outpatient telemetry monitoring is recommended.  DIAGNOSIS: Mild Obstructive Sleep Apnea (G47.33) Possible Atrial Arrhythmias  RECOMMENDATIONS:   1.  Clinical correlation of these findings is necessary.  The decision to treat obstructive sleep apnea (OSA) is usually based on the presence of apnea symptoms or the presence of associated medical conditions such as Hypertension, Congestive Heart Failure, Atrial Fibrillation or  Obesity.  The most common symptoms of OSA are snoring, gasping for breath while sleeping, daytime sleepiness and fatigue.   2.  Initiating apnea therapy is recommended given the presence of symptoms and/or associated conditions. Recommend proceeding with one of the following:     a.  Auto-CPAP therapy with a pressure range of 5-20cm H2O.     b.  An oral appliance (OA) that can be obtained from certain dentists with expertise in sleep medicine.  These are primarily of use in non-obese patients with mild and moderate disease.     c.  An ENT consultation which may be useful to look for specific causes of obstruction and possible treatment options.     d.  If patient is intolerant to PAP therapy, consider referral to ENT for evaluation for hypoglossal nerve stimulator.   3.  Close follow-up is necessary to ensure success with CPAP or oral appliance therapy for maximum benefit.  4.  A follow-up oximetry study on CPAP is recommended to assess the adequacy of therapy and determine the need for supplemental oxygen or the potential need for Bi-level therapy.  An arterial blood gas to determine the adequacy of baseline ventilation and oxygenation should also be considered.  5.  Healthy sleep recommendations include:  adequate nightly sleep (normal 7-9 hrs/night), avoidance of caffeine after noon and alcohol near bedtime, and maintaining a sleep environment that is cool, dark and quiet.  6.  Weight loss for overweight patients is recommended.  Even modest amounts of weight loss can significantly improve the severity of sleep apnea.  7.  Snoring recommendations include:  weight loss where appropriate, side sleeping, and avoidance of alcohol before bed.  8.  Operation of motor vehicle should be avoided when sleepy.  Signature: Fransico Him, MD; [facility]; [place], [place] Board of Sleep Medicine Electronically Signed: 05/02/2022 Rev.

## 2022-05-04 ENCOUNTER — Telehealth (HOSPITAL_COMMUNITY): Payer: Self-pay | Admitting: Family Medicine

## 2022-05-04 NOTE — Telephone Encounter (Signed)
Forwarded to [facility] to set up nurse visit for this.

## 2022-05-04 NOTE — Telephone Encounter (Signed)
Recent sleep study showed arrhythmias.   Please have Semeka come in for Zio 2 week to quantify arrhythmias.  Thanks, JM

## 2022-05-06 ENCOUNTER — Ambulatory Visit (HOSPITAL_COMMUNITY)
Admission: RE | Admit: 2022-05-06 | Discharge: 2022-05-06 | Disposition: A | Discharge status: Home / Self Care | Payer: Commercial Managed Care - HMO | Source: Ambulatory Visit | Attending: Internal Medicine | Admitting: Internal Medicine

## 2022-05-06 ENCOUNTER — Telehealth: Payer: Self-pay | Admitting: *Deleted

## 2022-05-06 ENCOUNTER — Inpatient Hospital Stay (HOSPITAL_COMMUNITY)
Admission: RE | Admit: 2022-05-06 | Discharge: 2022-05-06 | Disposition: A | Discharge status: Home / Self Care | Payer: Commercial Managed Care - HMO | Source: Ambulatory Visit | Attending: Internal Medicine | Admitting: Internal Medicine

## 2022-05-06 DIAGNOSIS — I499 Cardiac arrhythmia, unspecified: Secondary | ICD-10-CM | POA: Insufficient documentation

## 2022-05-06 NOTE — Telephone Encounter (Signed)
The patient has been notified of the result. Left detailed message on voicemail and informed patient to call back..Sascha Baugher Green, CMA   

## 2022-05-06 NOTE — Telephone Encounter (Signed)
-----   Message from Lauralee Evener, [place] sent at 05/04/2022  8:45 AM EST -----  ----- Message ----- From: Sueanne Margarita, MD Sent: 05/02/2022  10:42 AM EST To: Cv [facility] Sleep Studies  Patient has very mild OSA - set up OV to discuss treatment options.

## 2022-05-06 NOTE — Progress Notes (Signed)
Zio placed as ordered by Janett Billow milford, np/bensimhon,md 14 days Reason: arrhythmias

## 2022-05-07 NOTE — Telephone Encounter (Signed)
Return Call:  Patient called back and is agreeable to setting an OV to discuss treatment options.

## 2022-05-21 ENCOUNTER — Encounter (HOSPITAL_COMMUNITY): Payer: Commercial Managed Care - HMO

## 2022-05-22 ENCOUNTER — Telehealth (HOSPITAL_COMMUNITY): Payer: Self-pay

## 2022-05-22 ENCOUNTER — Telehealth (HOSPITAL_BASED_OUTPATIENT_CLINIC_OR_DEPARTMENT_OTHER): Payer: Self-pay

## 2022-05-22 DIAGNOSIS — I499 Cardiac arrhythmia, unspecified: Secondary | ICD-10-CM

## 2022-05-22 NOTE — Telephone Encounter (Signed)
Order for [facility] Sleep study as requested by Allena Katz NP on office visit 05/06/2022. Georgana Curio MHA RN CCM

## 2022-05-22 NOTE — Telephone Encounter (Signed)
Records release received. Records faxed to [facility]

## 2022-06-08 ENCOUNTER — Other Ambulatory Visit: Payer: Self-pay | Admitting: Internal Medicine

## 2022-06-08 DIAGNOSIS — Z1231 Encounter for screening mammogram for malignant neoplasm of breast: Secondary | ICD-10-CM

## 2022-06-09 NOTE — Addendum Note (Signed)
Encounter addended by: Micki Riley, RN on: 06/09/2022 11:33 AM  Actions taken: Imaging Exam ended

## 2022-06-12 ENCOUNTER — Other Ambulatory Visit (HOSPITAL_COMMUNITY): Payer: Self-pay

## 2022-06-12 ENCOUNTER — Other Ambulatory Visit: Payer: Self-pay

## 2022-06-17 ENCOUNTER — Other Ambulatory Visit (HOSPITAL_COMMUNITY): Payer: Self-pay

## 2022-06-19 ENCOUNTER — Other Ambulatory Visit (HOSPITAL_COMMUNITY): Payer: Self-pay

## 2022-06-19 ENCOUNTER — Other Ambulatory Visit (HOSPITAL_COMMUNITY): Payer: Self-pay | Admitting: Cardiology

## 2022-06-19 MED ORDER — IBUPROFEN 400 MG PO TABS
400.0000 mg | ORAL_TABLET | Freq: Three times a day (TID) | ORAL | 0 refills | Status: DC
  Filled 2022-06-19 (×2): qty 21, 7d supply, fill #0

## 2022-06-19 MED ORDER — OMEPRAZOLE 20 MG PO CPDR
20.0000 mg | DELAYED_RELEASE_CAPSULE | Freq: Every day | ORAL | 0 refills | Status: DC
  Filled 2022-06-19 (×2): qty 30, 30d supply, fill #0

## 2022-06-19 MED ORDER — FUROSEMIDE 40 MG PO TABS
40.0000 mg | ORAL_TABLET | Freq: Every day | ORAL | 11 refills | Status: DC
  Filled 2022-06-19: qty 30, 30d supply, fill #0
  Filled 2022-07-14: qty 30, 30d supply, fill #1
  Filled 2022-08-18 – 2022-09-04 (×2): qty 30, 30d supply, fill #2
  Filled 2022-10-18: qty 30, 30d supply, fill #3

## 2022-06-24 ENCOUNTER — Encounter: Payer: Self-pay | Admitting: Cardiology

## 2022-06-24 ENCOUNTER — Ambulatory Visit: Payer: Commercial Managed Care - HMO | Attending: Cardiology

## 2022-06-24 ENCOUNTER — Telehealth: Payer: Self-pay | Admitting: *Deleted

## 2022-06-24 ENCOUNTER — Ambulatory Visit (INDEPENDENT_AMBULATORY_CARE_PROVIDER_SITE_OTHER): Payer: Commercial Managed Care - HMO | Admitting: Cardiology

## 2022-06-24 VITALS — BP 118/92 | HR 72 | Ht 61.0 in | Wt 170.0 lb

## 2022-06-24 DIAGNOSIS — G4733 Obstructive sleep apnea (adult) (pediatric): Secondary | ICD-10-CM

## 2022-06-24 DIAGNOSIS — I10 Essential (primary) hypertension: Secondary | ICD-10-CM | POA: Diagnosis not present

## 2022-06-24 DIAGNOSIS — I110 Hypertensive heart disease with heart failure: Secondary | ICD-10-CM | POA: Diagnosis not present

## 2022-06-24 DIAGNOSIS — R0683 Snoring: Secondary | ICD-10-CM

## 2022-06-24 DIAGNOSIS — I5022 Chronic systolic (congestive) heart failure: Secondary | ICD-10-CM | POA: Diagnosis not present

## 2022-06-24 NOTE — Patient Instructions (Signed)
Medication Instructions:  Your physician recommends that you continue on your current medications as directed. Please refer to the Current Medication list given to you today.  *If you need a refill on your cardiac medications before your next appointment, please call your pharmacy*   Lab Work: None.  If you have labs (blood work) drawn today and your tests are completely normal, you will receive your results only by: MyChart Message (if you have MyChart) OR A paper copy in the mail If you have any lab test that is abnormal or we need to change your treatment, we will call you to review the results.   Testing/Procedures: None.   Follow-Up: At [facility], you and your health needs are our priority.  As part of our continuing mission to provide you with exceptional heart care, we have created designated Provider Care Teams.  These Care Teams include your primary Cardiologist (physician) and Advanced Practice Providers (APPs -  Physician Assistants and Nurse Practitioners) who all work together to provide you with the care you need, when you need it.  We recommend signing up for the patient portal called "MyChart".  Sign up information is provided on this After Visit Summary.  MyChart is used to connect with patients for Virtual Visits (Telemedicine).  Patients are able to view lab/test results, encounter notes, upcoming appointments, etc.  Non-urgent messages can be sent to your provider as well.   To learn more about what you can do with MyChart, go to [email or web address].    Your next appointment will be dependent on your DME equipment delivery and it will be with  Provider:   Dr. Armanda Magic, MD

## 2022-06-24 NOTE — Telephone Encounter (Signed)
Upon patient request DME selection is [facility] Patient understands he will be contacted by [facility] to set up his cpap. Patient understands to call if [facility] does not contact him with new setup in a timely manner. Patient understands they will be called once confirmation has been received from [facility] that they have received their new machine to schedule 10 week follow up appointment.   [facility] notified of new cpap order  Please add to airview Patient was grateful for the call and thanked me.  

## 2022-06-24 NOTE — Telephone Encounter (Signed)
Per Dr Mayford Knife, order an auto CPAP from 4 to 12cm H2O with heated humidity and nasal pillow mask -followp with me 6 weeks after she gets her new device

## 2022-06-24 NOTE — Progress Notes (Signed)
Sleep Medicine CONSULT Note    Date:  06/24/2022   ID:  Alexandria Rhodes, DOB 07-Jun-1975, MRN [id number]  PCP:  Lorenda Ishihara, MD  Cardiologist: Arvilla Meres, MD  Chief Complaint  Patient presents with   New Patient (Initial Visit)    Sleep apnea    History of Present Illness:  Alexandria Rhodes is a 47 y.o. female who is being seen today for the evaluation of obstructive sleep apnea at the request of Prince Rome, NP.  This is a 47 year old female with a history of nonischemic cardiomyopathy, prior PE, migraine headaches and hypertension.  She has a chronic systolic CHF.  See MRI 5/21 showed EF 29% and findings consistent with not ischemic cardiomyopathy.  At last office visit with Prince Rome, NP a sleep study was ordered because of a history of bradycardia, CHF and snoring.  STOP-BANG score was 3.  Home sleep study showed very mild obstructive sleep apnea with an AHI of 6.8/h but during REM sleep was moderate with an AHI of 14.4/h.  He is now referred for sleep medicine consultation to discuss results of sleep study.  She goes to bed at 10 to 11pm and takes about 30 min to fall asleep.  She gets up around 6 times nightly to either go to the bathroom of just wakes up for no reason.  She feels tired sometimes when she wakes up in the am.  She does not get sleepy during the day unless she stops moving around and has fallen asleep if she is sedentary.  She has never had a problem staying awake at work.  She denies any am HAs.    Past Medical History:  Diagnosis Date   Acute pulmonary embolism    Cardiomyopathy    CHF (congestive heart failure)    Hypertension    Non-ischemic cardiomyopathy 07/2019    No past surgical history on file.  Current Medications: Current Meds  Medication Sig   acetaminophen (TYLENOL) 500 MG tablet Take 1,000-1,500 mg by mouth 2 (two) times daily as needed for mild pain or headache.   dapagliflozin propanediol (FARXIGA) 10 MG TABS tablet  Take 1 tablet (10 mg total) by mouth daily.   furosemide (LASIX) 40 MG tablet Take 1 tablet (40 mg total) by mouth daily.   ibuprofen (ADVIL) 400 MG tablet Take 1 tablet (400 mg total) by mouth 3 (three) times daily as needed for 7 days with food or milk   omeprazole (PRILOSEC) 20 MG capsule Take 1 capsule (20 mg total) by mouth daily 30 minutes before morning meal   potassium chloride SA (KLOR-CON M) 20 MEQ tablet Take 1 tablet (20 mEq total) by mouth daily.   spironolactone (ALDACTONE) 25 MG tablet Take 1/2 tablet (12.5 mg total) by mouth daily.   SUMAtriptan (IMITREX) 50 MG tablet Take 50 mg by mouth every 2 (two) hours as needed for migraine. May repeat in 2 hours if headache persists or recurs.    Allergies:   Shellfish allergy   Social History   Socioeconomic History   Marital status: Single    Spouse name: Not on file   Number of children: Not on file   Years of education: Not on file   Highest education level: Not on file  Occupational History   Not on file  Tobacco Use   Smoking status: Former   Smokeless tobacco: Never  Vaping Use   Vaping Use: Never used  Substance and Sexual Activity   Alcohol use: Not  Currently   Drug use: Not Currently   Sexual activity: Yes  Other Topics Concern   Not on file  Social History Narrative   Not on file   Social Determinants of Health   Financial Resource Strain: High Risk (06/13/2021)   Overall Financial Resource Strain (CARDIA)    Difficulty of Paying Living Expenses: Very hard  Food Insecurity: Food Insecurity Present (02/17/2021)   Hunger Vital Sign    Worried About Running Out of Food in the Last Year: Sometimes true    Ran Out of Food in the Last Year: Sometimes true  Transportation Needs: Unmet Transportation Needs (01/24/2021)   PRAPARE - Administrator, Civil Service (Medical): Yes    Lack of Transportation (Non-Medical): Yes  Physical Activity: Unknown (03/28/2018)   Exercise Vital Sign    Days of Exercise  per Week: Patient declined    Minutes of Exercise per Session: Patient declined  Stress: No Stress Concern Present (03/28/2018)   [employer] of Occupational Health - Occupational Stress Questionnaire    Feeling of Stress : Not at all  Social Connections: Unknown (03/28/2018)   Social Connection and Isolation Panel [NHANES]    Frequency of Communication with Friends and Family: Patient declined    Frequency of Social Gatherings with Friends and Family: Patient declined    Attends Religious Services: Patient declined    Database administrator or Organizations: Patient declined    Attends Banker Meetings: Patient declined    Marital Status: Patient declined     Family History:  The patient's family history includes Hypertension in her mother.   ROS:   Please see the history of present illness.    ROS All other systems reviewed and are negative.      No data to display             PHYSICAL EXAM:   VS:  BP (!) 118/92   Pulse 72   Ht 5\' 1"  (1.549 m)   Wt 170 lb (77.1 kg)   LMP 03/27/2014 (Approximate)   BMI 32.12 kg/m    GEN: Well nourished, well developed, in no acute distress  HEENT: normal  Neck: no JVD, carotid bruits, or masses Cardiac: RRR; no murmurs, rubs, or gallops,no edema.  Intact distal pulses bilaterally.  Respiratory:  clear to auscultation bilaterally, normal work of breathing GI: soft, nontender, nondistended, + BS MS: no deformity or atrophy  Skin: warm and dry, no rash Neuro:  Alert and Oriented x 3, Strength and sensation are intact Psych: euthymic mood, full affect  Wt Readings from Last 3 Encounters:  06/24/22 170 lb (77.1 kg)  04/28/22 171 lb 9.6 oz (77.8 kg)  04/06/22 170 lb (77.1 kg)      Studies/Labs Reviewed:   Home sleep study  Recent Labs: 04/06/2022: ALT 20 04/19/2022: Hemoglobin 12.3; Platelets 270 04/28/2022: B Natriuretic Peptide 283.0; BUN 12; Creatinine, Ser 0.88; Potassium 4.3; Sodium 140    Additional  studies/ records that were reviewed today include:  none    ASSESSMENT:    1. OSA (obstructive sleep apnea)   2. Essential hypertension      PLAN:  In order of problems listed above:  OSA  -very mild obstructive sleep apnea with an AHI of 6.8/h but during REM sleep was mild to moderate with an AHI of 14.4/h. -His STOP-BANG score was borderline at 3 -given that she has some daytime sleepiness as well as frequent awakenings at night (likely some related to  diuretic use) as CHF, I would like to give her a trial of CPAP -I will order an auto CPAP from 4 to 12cm H2O with heated humidity and nasal pillow mask -followp with me 6 weeks after she gets her new device  2.  Hypertension -BP controlled on exam -Continue spironolactone 12.5 mg daily with as needed refills  Time Spent: 20 minutes total time of encounter, including 15 minutes spent in face-to-face patient care on the date of this encounter. This time includes coordination of care and counseling regarding above mentioned problem list. Remainder of non-face-to-face time involved reviewing chart documents/testing relevant to the patient encounter and documentation in the medical record. I have independently reviewed documentation from referring provider  Medication Adjustments/Labs and Tests Ordered: Current medicines are reviewed at length with the patient today.  Concerns regarding medicines are outlined above.  Medication changes, Labs and Tests ordered today are listed in the Patient Instructions below.  There are no Patient Instructions on file for this visit.   Signed, Armanda Magicraci [place], MD  06/24/2022 4:37 PM    [facility] [address], [place], [place]  [address] Phone: [phone]; Fax: [phone]) [phone]

## 2022-06-26 ENCOUNTER — Other Ambulatory Visit (HOSPITAL_COMMUNITY): Payer: Self-pay

## 2022-07-07 ENCOUNTER — Encounter (HOSPITAL_COMMUNITY): Payer: Self-pay

## 2022-07-09 ENCOUNTER — Other Ambulatory Visit (HOSPITAL_COMMUNITY): Payer: Self-pay | Admitting: Internal Medicine

## 2022-07-09 DIAGNOSIS — R7989 Other specified abnormal findings of blood chemistry: Secondary | ICD-10-CM

## 2022-07-14 ENCOUNTER — Other Ambulatory Visit (HOSPITAL_COMMUNITY): Payer: Self-pay

## 2022-07-14 ENCOUNTER — Other Ambulatory Visit: Payer: Self-pay

## 2022-07-14 ENCOUNTER — Other Ambulatory Visit (HOSPITAL_COMMUNITY): Payer: Self-pay | Admitting: Internal Medicine

## 2022-07-14 ENCOUNTER — Ambulatory Visit (HOSPITAL_COMMUNITY)
Admission: RE | Admit: 2022-07-14 | Discharge: 2022-07-14 | Disposition: A | Discharge status: Home / Self Care | Payer: Commercial Managed Care - HMO | Source: Ambulatory Visit | Attending: Internal Medicine | Admitting: Internal Medicine

## 2022-07-14 DIAGNOSIS — R7989 Other specified abnormal findings of blood chemistry: Secondary | ICD-10-CM | POA: Diagnosis present

## 2022-07-14 MED ORDER — DAPAGLIFLOZIN PROPANEDIOL 10 MG PO TABS
10.0000 mg | ORAL_TABLET | Freq: Every day | ORAL | 3 refills | Status: DC
  Filled 2022-07-14 – 2022-09-04 (×4): qty 30, 30d supply, fill #0
  Filled 2022-10-18: qty 30, 30d supply, fill #1

## 2022-07-15 ENCOUNTER — Other Ambulatory Visit: Payer: Self-pay

## 2022-07-23 ENCOUNTER — Ambulatory Visit
Admission: RE | Admit: 2022-07-23 | Discharge: 2022-07-23 | Disposition: A | Discharge status: Home / Self Care | Payer: Commercial Managed Care - HMO | Source: Ambulatory Visit | Attending: Internal Medicine | Admitting: Internal Medicine

## 2022-07-23 ENCOUNTER — Other Ambulatory Visit (HOSPITAL_COMMUNITY): Payer: Self-pay

## 2022-07-23 DIAGNOSIS — Z1231 Encounter for screening mammogram for malignant neoplasm of breast: Secondary | ICD-10-CM

## 2022-07-23 MED ORDER — OMEPRAZOLE 20 MG PO CPDR
20.0000 mg | DELAYED_RELEASE_CAPSULE | Freq: Every morning | ORAL | 4 refills | Status: DC
  Filled 2022-07-23: qty 30, 30d supply, fill #0
  Filled 2022-08-18 – 2022-09-04 (×2): qty 30, 30d supply, fill #1
  Filled 2022-10-18: qty 30, 30d supply, fill #2

## 2022-07-24 ENCOUNTER — Other Ambulatory Visit (HOSPITAL_COMMUNITY): Payer: Self-pay

## 2022-07-27 ENCOUNTER — Other Ambulatory Visit (HOSPITAL_COMMUNITY): Payer: Self-pay

## 2022-08-06 ENCOUNTER — Other Ambulatory Visit (HOSPITAL_COMMUNITY): Payer: Self-pay

## 2022-08-18 ENCOUNTER — Other Ambulatory Visit: Payer: Self-pay

## 2022-08-18 ENCOUNTER — Other Ambulatory Visit (HOSPITAL_COMMUNITY): Payer: Self-pay

## 2022-08-31 ENCOUNTER — Other Ambulatory Visit (HOSPITAL_COMMUNITY): Payer: Self-pay

## 2022-09-02 ENCOUNTER — Emergency Department (HOSPITAL_COMMUNITY): Payer: Commercial Managed Care - HMO

## 2022-09-02 ENCOUNTER — Emergency Department (HOSPITAL_COMMUNITY)
Admission: EM | Admit: 2022-09-02 | Discharge: 2022-09-02 | Disposition: A | Discharge status: Home / Self Care | Payer: Commercial Managed Care - HMO | Attending: Emergency Medicine | Admitting: Emergency Medicine

## 2022-09-02 ENCOUNTER — Other Ambulatory Visit: Payer: Self-pay

## 2022-09-02 DIAGNOSIS — I5022 Chronic systolic (congestive) heart failure: Secondary | ICD-10-CM | POA: Diagnosis not present

## 2022-09-02 DIAGNOSIS — J181 Lobar pneumonia, unspecified organism: Secondary | ICD-10-CM | POA: Insufficient documentation

## 2022-09-02 DIAGNOSIS — E860 Dehydration: Secondary | ICD-10-CM | POA: Diagnosis not present

## 2022-09-02 DIAGNOSIS — Z79899 Other long term (current) drug therapy: Secondary | ICD-10-CM | POA: Insufficient documentation

## 2022-09-02 DIAGNOSIS — R112 Nausea with vomiting, unspecified: Secondary | ICD-10-CM | POA: Insufficient documentation

## 2022-09-02 DIAGNOSIS — R000 Tachycardia, unspecified: Secondary | ICD-10-CM | POA: Diagnosis not present

## 2022-09-02 DIAGNOSIS — I110 Hypertensive heart disease with heart failure: Secondary | ICD-10-CM | POA: Insufficient documentation

## 2022-09-02 DIAGNOSIS — J189 Pneumonia, unspecified organism: Secondary | ICD-10-CM

## 2022-09-02 DIAGNOSIS — Z1152 Encounter for screening for COVID-19: Secondary | ICD-10-CM | POA: Insufficient documentation

## 2022-09-02 DIAGNOSIS — R079 Chest pain, unspecified: Secondary | ICD-10-CM | POA: Diagnosis present

## 2022-09-02 LAB — COMPREHENSIVE METABOLIC PANEL
ALT: 28 U/L (ref 0–44)
AST: 26 U/L (ref 15–41)
Albumin: 3.5 g/dL (ref 3.5–5.0)
Alkaline Phosphatase: 74 U/L (ref 38–126)
Anion gap: 14 (ref 5–15)
BUN: 13 mg/dL (ref 6–20)
CO2: 18 mmol/L — ABNORMAL LOW (ref 22–32)
Calcium: 9.2 mg/dL (ref 8.9–10.3)
Chloride: 109 mmol/L (ref 98–111)
Creatinine, Ser: 0.87 mg/dL (ref 0.44–1.00)
GFR, Estimated: >60 mL/min (ref >60)
Glucose, Bld: 112 mg/dL — ABNORMAL HIGH (ref 70–99)
Potassium: 3.7 mmol/L (ref 3.5–5.1)
Sodium: 141 mmol/L (ref 135–145)
Total Bilirubin: 1.2 mg/dL (ref 0.3–1.2)
Total Protein: 6.9 g/dL (ref 6.5–8.1)

## 2022-09-02 LAB — URINALYSIS, ROUTINE W REFLEX MICROSCOPIC
Bacteria, UA: NONE SEEN
Bilirubin Urine: NEGATIVE
Glucose, UA: NEGATIVE mg/dL
Hgb urine dipstick: NEGATIVE
Ketones, ur: 5 mg/dL — AB
Leukocytes,Ua: NEGATIVE
Nitrite: NEGATIVE
Protein, ur: 100 mg/dL — AB
Specific Gravity, Urine: 1.029 (ref 1.005–1.030)
pH: 5 (ref 5.0–8.0)

## 2022-09-02 LAB — D-DIMER, QUANTITATIVE: D-Dimer, Quant: 1.74 ug/mL-FEU — ABNORMAL HIGH (ref 0.00–0.50)

## 2022-09-02 LAB — CBC
HCT: 33.5 % — ABNORMAL LOW (ref 36.0–46.0)
Hemoglobin: 10.7 g/dL — ABNORMAL LOW (ref 12.0–15.0)
MCH: 28.6 pg (ref 26.0–34.0)
MCHC: 31.9 g/dL (ref 30.0–36.0)
MCV: 89.6 fL (ref 80.0–100.0)
Platelets: 269 10*3/uL (ref 150–400)
RBC: 3.74 MIL/uL — ABNORMAL LOW (ref 3.87–5.11)
RDW: 14.8 % (ref 11.5–15.5)
WBC: 6.5 10*3/uL (ref 4.0–10.5)
nRBC: 0 % (ref 0.0–0.2)

## 2022-09-02 LAB — TROPONIN I (HIGH SENSITIVITY)
Troponin I (High Sensitivity): 41 ng/L — ABNORMAL HIGH (ref <18)
Troponin I (High Sensitivity): 40 ng/L — ABNORMAL HIGH (ref <18)

## 2022-09-02 LAB — SARS CORONAVIRUS 2 BY RT PCR: SARS Coronavirus 2 by RT PCR: NEGATIVE

## 2022-09-02 LAB — MAGNESIUM: Magnesium: 2 mg/dL (ref 1.7–2.4)

## 2022-09-02 MED ORDER — DOXYCYCLINE HYCLATE 100 MG PO CAPS
100.0000 mg | ORAL_CAPSULE | Freq: Two times a day (BID) | ORAL | 0 refills | Status: DC
  Filled 2022-09-02: qty 14, 7d supply, fill #0

## 2022-09-02 MED ORDER — HYDROCODONE-ACETAMINOPHEN 5-325 MG PO TABS
1.0000 | ORAL_TABLET | ORAL | 0 refills | Status: DC | PRN
  Filled 2022-09-02: qty 10, 2d supply, fill #0

## 2022-09-02 MED ORDER — MORPHINE SULFATE (PF) 4 MG/ML IV SOLN
4.0000 mg | Freq: Once | INTRAVENOUS | Status: AC
  Administered 2022-09-02: 4 mg via INTRAVENOUS
  Filled 2022-09-02: qty 1

## 2022-09-02 MED ORDER — IPRATROPIUM-ALBUTEROL 0.5-2.5 (3) MG/3ML IN SOLN
3.0000 mL | Freq: Once | RESPIRATORY_TRACT | Status: AC
  Administered 2022-09-02: 3 mL via RESPIRATORY_TRACT
  Filled 2022-09-02: qty 3

## 2022-09-02 MED ORDER — ONDANSETRON HCL 4 MG/2ML IJ SOLN
4.0000 mg | Freq: Once | INTRAMUSCULAR | Status: AC
  Administered 2022-09-02: 4 mg via INTRAVENOUS
  Filled 2022-09-02: qty 2

## 2022-09-02 MED ORDER — SODIUM CHLORIDE 0.9 % IV SOLN
1.0000 g | Freq: Once | INTRAVENOUS | Status: AC
  Administered 2022-09-02: 1 g via INTRAVENOUS
  Filled 2022-09-02: qty 10

## 2022-09-02 MED ORDER — ONDANSETRON 4 MG PO TBDP
4.0000 mg | ORAL_TABLET | Freq: Three times a day (TID) | ORAL | 0 refills | Status: DC | PRN
  Filled 2022-09-02: qty 20, 7d supply, fill #0

## 2022-09-02 MED ORDER — SODIUM CHLORIDE 0.9 % IV BOLUS
500.0000 mL | Freq: Once | INTRAVENOUS | Status: AC
  Administered 2022-09-02: 500 mL via INTRAVENOUS

## 2022-09-02 MED ORDER — IOHEXOL 350 MG/ML SOLN
75.0000 mL | Freq: Once | INTRAVENOUS | Status: AC | PRN
  Administered 2022-09-02: 75 mL via INTRAVENOUS

## 2022-09-02 MED ORDER — SODIUM CHLORIDE 0.9 % IV SOLN
500.0000 mg | Freq: Once | INTRAVENOUS | Status: DC
  Filled 2022-09-02: qty 5

## 2022-09-02 MED ORDER — SODIUM CHLORIDE 0.9 % IV BOLUS: 1000.0000 mL | Freq: Once | INTRAVENOUS | Status: DC

## 2022-09-02 NOTE — ED Provider Notes (Signed)
[facility] [facility] AT [facility] Provider Note   CSN: [id number] Arrival date & time: 09/02/22  1534     History  Chief Complaint  Patient presents with   Chest Pain    Pt coming from home with a cough, diarrhea and body aches since Saturday. Pt also complains of chest pain. Pt denies any fever or shob.     Alexandria Rhodes is a 47 y.o. female.  Pt is a 47 yo female with pmhx significant for nonischemic cardiomyopathy, CHF (EF around 30%), prior PE, migraine headaches and hypertension.  Pt said she's had cp since yesterday.  It is also associated with a cough, n/v/diarrhea and body aches.  No fever.  No sob.  Pt said the pain is like a crampy sensation.  It is different than anything she's had in the past.       Home Medications Prior to Admission medications   Medication Sig Start Date End Date Taking? Authorizing Provider  doxycycline (VIBRAMYCIN) 100 MG capsule Take 1 capsule (100 mg total) by mouth 2 (two) times daily. 09/02/22  Yes Jacalyn Lefevre, MD  HYDROcodone-acetaminophen (NORCO/VICODIN) 5-325 MG tablet Take 1 tablet by mouth every 4 (four) hours as needed. 09/02/22  Yes Jacalyn Lefevre, MD  ondansetron (ZOFRAN-ODT) 4 MG disintegrating tablet Take 1 tablet (4 mg total) by mouth every 8 (eight) hours as needed for nausea or vomiting. 09/02/22  Yes Jacalyn Lefevre, MD  acetaminophen (TYLENOL) 500 MG tablet Take 1,000-1,500 mg by mouth 2 (two) times daily as needed for mild pain or headache.    [provider]  dapagliflozin propanediol (FARXIGA) 10 MG TABS tablet Take 1 tablet (10 mg total) by mouth daily. 07/14/22   Bensimhon, Bevelyn Buckles, MD  furosemide (LASIX) 40 MG tablet Take 1 tablet (40 mg total) by mouth daily. 06/19/22   Bensimhon, Bevelyn Buckles, MD  ibuprofen (ADVIL) 400 MG tablet Take 1 tablet (400 mg total) by mouth 3 (three) times daily as needed for 7 days with food or milk 06/19/22     omeprazole (PRILOSEC) 20 MG capsule Take 1 capsule (20 mg  total) by mouth in the morning 30 minutes before morning meal 07/23/22     potassium chloride SA (KLOR-CON M) 20 MEQ tablet Take 1 tablet (20 mEq total) by mouth daily. 04/30/22   Bensimhon, Bevelyn Buckles, MD  spironolactone (ALDACTONE) 25 MG tablet Take 1/2 tablet (12.5 mg total) by mouth daily. 04/30/22   Bensimhon, Bevelyn Buckles, MD  SUMAtriptan (IMITREX) 50 MG tablet Take 50 mg by mouth every 2 (two) hours as needed for migraine. May repeat in 2 hours if headache persists or recurs.    [provider]      Allergies    Shellfish allergy    Review of Systems   Review of Systems  Respiratory:  Positive for shortness of breath.   Cardiovascular:  Positive for chest pain.  Gastrointestinal:  Positive for diarrhea, nausea and vomiting.  All other systems reviewed and are negative.   Physical Exam Updated Vital Signs BP (!) 143/113   Pulse 99   Temp 97.9 F (36.6 C) (Oral)   Resp 18   Ht 5\' 1"  (1.549 m)   Wt 73 kg   LMP 03/27/2014 (Approximate)   SpO2 99%   BMI 30.42 kg/m  Physical Exam Vitals and nursing note reviewed.  Constitutional:      Appearance: She is well-developed.  HENT:     Head: Normocephalic and atraumatic.  Eyes:  Extraocular Movements: Extraocular movements intact.     Pupils: Pupils are equal, round, and reactive to light.  Cardiovascular:     Rate and Rhythm: Regular rhythm. Tachycardia present.     Heart sounds: Normal heart sounds.  Pulmonary:     Effort: Pulmonary effort is normal.     Breath sounds: Wheezing present.  Abdominal:     General: Bowel sounds are normal.     Palpations: Abdomen is soft.  Musculoskeletal:        General: Normal range of motion.     Cervical back: Normal range of motion and neck supple.  Skin:    General: Skin is warm.     Capillary Refill: Capillary refill takes less than 2 seconds.  Neurological:     General: No focal deficit present.     Mental Status: She is alert and oriented to person, place, and time.   Psychiatric:        Mood and Affect: Mood normal.        Behavior: Behavior normal.     ED Results / Procedures / Treatments   Labs (all labs ordered are listed, but only abnormal results are displayed) Labs Reviewed  CBC - Abnormal; Notable for the following components:      Result Value   RBC 3.74 (*)    Hemoglobin 10.7 (*)    HCT 33.5 (*)    All other components within normal limits  COMPREHENSIVE METABOLIC PANEL - Abnormal; Notable for the following components:   CO2 18 (*)    Glucose, Bld 112 (*)    All other components within normal limits  D-DIMER, QUANTITATIVE - Abnormal; Notable for the following components:   D-Dimer, Quant 1.74 (*)    All other components within normal limits  URINALYSIS, ROUTINE W REFLEX MICROSCOPIC - Abnormal; Notable for the following components:   Color, Urine AMBER (*)    Ketones, ur 5 (*)    Protein, ur 100 (*)    All other components within normal limits  TROPONIN I (HIGH SENSITIVITY) - Abnormal; Notable for the following components:   Troponin I (High Sensitivity) 41 (*)    All other components within normal limits  TROPONIN I (HIGH SENSITIVITY) - Abnormal; Notable for the following components:   Troponin I (High Sensitivity) 40 (*)    All other components within normal limits  SARS CORONAVIRUS 2 BY RT PCR  MAGNESIUM    EKG EKG Interpretation  Date/Time:  Wednesday September 02 2022 16:56:41 EDT Ventricular Rate:  112 PR Interval:  182 QRS Duration: 98 QT Interval:  342 QTC Calculation: 466 R Axis:   56 Text Interpretation: Sinus tachycardia Nonspecific T wave abnormality Abnormal ECG When compared with ECG of 28-Apr-2022 14:59, PREVIOUS ECG IS PRESENT Since last tracing rate faster Confirmed by Jacalyn Lefevre [phone]) on 09/02/2022 4:58:53 PM  Radiology US Abdomen Limited RUQ (LIVER/GB)  Result Date: 09/02/2022 CLINICAL DATA:  Right upper quadrant abdominal pain. EXAM: ULTRASOUND ABDOMEN LIMITED RIGHT UPPER QUADRANT COMPARISON:   None Available. FINDINGS: Gallbladder: No gallstone. Top-normal appearance of the gallbladder wall with possible mild edema. The gallbladder wall measures approximately 4 mm in thickness. No pericholecystic fluid. Negative sonographic Murphy's sign. Common bile duct: Diameter: 2 mm Liver: No focal lesion identified. Within normal limits in parenchymal echogenicity. Portal vein is patent on color Doppler imaging with normal direction of blood flow towards the liver. Other: None. IMPRESSION: No gallstone. Electronically Signed   By: Elgie Collard M.D.   On: 09/02/2022 21:11  CT Angio Chest PE W and/or Wo Contrast  Result Date: 09/02/2022 CLINICAL DATA:  Pulmonary embolism suspected, high probability. Left-sided flank pain. EXAM: CT ANGIOGRAPHY CHEST CT ABDOMEN AND PELVIS WITH CONTRAST TECHNIQUE: Multidetector CT imaging of the chest was performed using the standard protocol during bolus administration of intravenous contrast. Multiplanar CT image reconstructions and MIPs were obtained to evaluate the vascular anatomy. Multidetector CT imaging of the abdomen and pelvis was performed using the standard protocol during bolus administration of intravenous contrast. RADIATION DOSE REDUCTION: This exam was performed according to the departmental dose-optimization program which includes automated exposure control, adjustment of the mA and/or kV according to patient size and/or use of iterative reconstruction technique. CONTRAST:  75mL OMNIPAQUE IOHEXOL 350 MG/ML SOLN COMPARISON:  04/19/2022. FINDINGS: CTA CHEST FINDINGS Cardiovascular: Heart is enlarged and there is a trace pericardial effusion. The aorta is normal in caliber. The pulmonary trunk is distended suggesting underlying pulmonary artery hypertension. No pulmonary embolism is seen. Mediastinum/Nodes: No mediastinal, hilar, or axillary lymphadenopathy. The thyroid gland, trachea, and esophagus are within normal limits. Lungs/Pleura: There is a small right  pleural effusion. Scattered ground-glass opacities are present in the right lung and perihilar region of the left upper lobe. Mild atelectasis is noted in the left lung. No pneumothorax. Musculoskeletal: Mild degenerative changes are present in the thoracic spine. No acute or suspicious osseous abnormality. Review of the MIP images confirms the above findings. CT ABDOMEN and PELVIS FINDINGS Hepatobiliary: No focal liver abnormality is seen. No biliary ductal dilatation. No stones in the gallbladder. There is suggestion of possible gallbladder wall thickening with fat stranding at the gallbladder fossa. Pancreas: Unremarkable. No pancreatic ductal dilatation or surrounding inflammatory changes. Spleen: Normal in size without focal abnormality. Adrenals/Urinary Tract: The adrenal glands are within normal limits. The kidneys enhance symmetrically. No renal calculus or hydronephrosis. The bladder is within normal limits. Stomach/Bowel: Stomach is within normal limits. Appendix appears normal. No evidence of bowel wall thickening, distention, or inflammatory changes. No free air or pneumatosis. Vascular/Lymphatic: No significant vascular findings are present. No enlarged abdominal or pelvic lymph nodes. Reproductive: Uterus and bilateral adnexa are unremarkable. Other: Small amount of free fluid is noted in the right pericolic gutter and cul-de-sac. Musculoskeletal: Mild degenerative changes are noted in the lumbar spine. No acute osseous abnormality. Review of the MIP images confirms the above findings. IMPRESSION: 1. No evidence of pulmonary embolism. 2. Scattered ground-glass opacities in the right lung and left upper lobe, possible edema or infiltrate. 3. Small right pleural effusion. 4. Suggestion of mild gallbladder wall thickening. Ultrasound is suggested for further evaluation. 5. Small amount of free fluid in the right pericolic gutter and cul-de-sac. 6. Cardiomegaly. 7. Distended pulmonary trunk suggesting  underlying pulmonary artery hypertension. Electronically Signed   By: Thornell Sartorius M.D.   On: 09/02/2022 20:26   CT ABDOMEN PELVIS W CONTRAST  Result Date: 09/02/2022 CLINICAL DATA:  Pulmonary embolism suspected, high probability. Left-sided flank pain. EXAM: CT ANGIOGRAPHY CHEST CT ABDOMEN AND PELVIS WITH CONTRAST TECHNIQUE: Multidetector CT imaging of the chest was performed using the standard protocol during bolus administration of intravenous contrast. Multiplanar CT image reconstructions and MIPs were obtained to evaluate the vascular anatomy. Multidetector CT imaging of the abdomen and pelvis was performed using the standard protocol during bolus administration of intravenous contrast. RADIATION DOSE REDUCTION: This exam was performed according to the departmental dose-optimization program which includes automated exposure control, adjustment of the mA and/or kV according to patient size and/or use of iterative  reconstruction technique. CONTRAST:  75mL OMNIPAQUE IOHEXOL 350 MG/ML SOLN COMPARISON:  04/19/2022. FINDINGS: CTA CHEST FINDINGS Cardiovascular: Heart is enlarged and there is a trace pericardial effusion. The aorta is normal in caliber. The pulmonary trunk is distended suggesting underlying pulmonary artery hypertension. No pulmonary embolism is seen. Mediastinum/Nodes: No mediastinal, hilar, or axillary lymphadenopathy. The thyroid gland, trachea, and esophagus are within normal limits. Lungs/Pleura: There is a small right pleural effusion. Scattered ground-glass opacities are present in the right lung and perihilar region of the left upper lobe. Mild atelectasis is noted in the left lung. No pneumothorax. Musculoskeletal: Mild degenerative changes are present in the thoracic spine. No acute or suspicious osseous abnormality. Review of the MIP images confirms the above findings. CT ABDOMEN and PELVIS FINDINGS Hepatobiliary: No focal liver abnormality is seen. No biliary ductal dilatation. No  stones in the gallbladder. There is suggestion of possible gallbladder wall thickening with fat stranding at the gallbladder fossa. Pancreas: Unremarkable. No pancreatic ductal dilatation or surrounding inflammatory changes. Spleen: Normal in size without focal abnormality. Adrenals/Urinary Tract: The adrenal glands are within normal limits. The kidneys enhance symmetrically. No renal calculus or hydronephrosis. The bladder is within normal limits. Stomach/Bowel: Stomach is within normal limits. Appendix appears normal. No evidence of bowel wall thickening, distention, or inflammatory changes. No free air or pneumatosis. Vascular/Lymphatic: No significant vascular findings are present. No enlarged abdominal or pelvic lymph nodes. Reproductive: Uterus and bilateral adnexa are unremarkable. Other: Small amount of free fluid is noted in the right pericolic gutter and cul-de-sac. Musculoskeletal: Mild degenerative changes are noted in the lumbar spine. No acute osseous abnormality. Review of the MIP images confirms the above findings. IMPRESSION: 1. No evidence of pulmonary embolism. 2. Scattered ground-glass opacities in the right lung and left upper lobe, possible edema or infiltrate. 3. Small right pleural effusion. 4. Suggestion of mild gallbladder wall thickening. Ultrasound is suggested for further evaluation. 5. Small amount of free fluid in the right pericolic gutter and cul-de-sac. 6. Cardiomegaly. 7. Distended pulmonary trunk suggesting underlying pulmonary artery hypertension. Electronically Signed   By: Thornell Sartorius M.D.   On: 09/02/2022 20:26   DG Chest 2 View  Result Date: 09/02/2022 CLINICAL DATA:  Cough. EXAM: CHEST - 2 VIEW COMPARISON:  April 19, 2022 FINDINGS: There is stable mild to moderate severity cardiac silhouette enlargement. Mildly increased interstitial lung markings are seen with mild prominence of the perihilar pulmonary vasculature. No pleural effusion or pneumothorax is identified.  The visualized skeletal structures are unremarkable. IMPRESSION: Stable cardiomegaly with findings suggestive of mild congestive heart failure. Electronically Signed   By: Aram Candela M.D.   On: 09/02/2022 17:47    Procedures Procedures    Medications Ordered in ED Medications  cefTRIAXone (ROCEPHIN) 1 g in sodium chloride 0.9 % 100 mL IVPB (1 g Intravenous New Bag/Given 09/02/22 2111)  morphine (PF) 4 MG/ML injection 4 mg (4 mg Intravenous Given 09/02/22 1703)  ondansetron (ZOFRAN) injection 4 mg (4 mg Intravenous Given 09/02/22 1659)  ipratropium-albuterol (DUONEB) 0.5-2.5 (3) MG/3ML nebulizer solution 3 mL (3 mLs Nebulization Given 09/02/22 1646)  sodium chloride 0.9 % bolus 500 mL (0 mLs Intravenous Stopped 09/02/22 1801)  iohexol (OMNIPAQUE) 350 MG/ML injection 75 mL (75 mLs Intravenous Contrast Given 09/02/22 2007)    ED Course/ Medical Decision Making/ A&P                             Medical Decision Making Amount  and/or Complexity of Data Reviewed Labs: ordered. Radiology: ordered.  Risk Prescription drug management.   This patient presents to the ED for concern of cp, this involves an extensive number of treatment options, and is a complaint that carries with it a high risk of complications and morbidity.  The differential diagnosis includes cad, PE, pna, covid, gi   Co morbidities that complicate the patient evaluation  nonischemic cardiomyopathy, CHF, prior PE, migraine headaches and hypertension.  She has a chronic systolic CHF.    Additional history obtained:  Additional history obtained from epic chart review External records from outside source obtained and reviewed including EMS report   Lab Tests:  I Ordered, and personally interpreted labs.  The pertinent results include:  cbc with hgb 10.7 (chronic), ddimer elevated at 1.74, covid neg, trop 40 and 41 which is stable, ua with ketones, cmp nl   Imaging Studies ordered:  I ordered imaging studies  including cxr/ct chest/abd/pelvis/us I independently visualized and interpreted imaging which showed  CXR: Stable cardiomegaly with findings suggestive of mild congestive  heart failure.  CT Chest/abd/pelvis: 1. No evidence of pulmonary embolism.  2. Scattered ground-glass opacities in the right lung and left upper  lobe, possible edema or infiltrate.  3. Small right pleural effusion.  4. Suggestion of mild gallbladder wall thickening. Ultrasound is  suggested for further evaluation.  5. Small amount of free fluid in the right pericolic gutter and  cul-de-sac.  6. Cardiomegaly.  7. Distended pulmonary trunk suggesting underlying pulmonary artery  hypertension.  [place]: No gallstone.  I agree with the radiologist interpretation   Cardiac Monitoring:  The patient was maintained on a cardiac monitor.  I personally viewed and interpreted the cardiac monitored which showed an underlying rhythm of: st   Medicines ordered and prescription drug management:  I ordered medication including morphine/zofran  for pain and n/v  Reevaluation of the patient after these medicines showed that the patient improved I have reviewed the patients home medicines and have made adjustments as needed   Test Considered:  Ct/us   Critical Interventions:  abx   Problem List / ED Course:  PNA:  pt given rocephin in ED.  The rest of work up is stable.  Pt is feeling better.  She is tolerating fluids. Pt is ok for d/c.  Return if worse.    Reevaluation:  After the interventions noted above, I reevaluated the patient and found that they have :improved   Social Determinants of Health:  Lives at home   Dispostion:  After consideration of the diagnostic results and the patients response to treatment, I feel that the patent would benefit from discharge with outpatient f/u.          Final Clinical Impression(s) / ED Diagnoses Final diagnoses:  Community acquired pneumonia of right lower  lobe of lung  Nausea and vomiting, unspecified vomiting type  Dehydration    Rx / DC Orders ED Discharge Orders          Ordered    doxycycline (VIBRAMYCIN) 100 MG capsule  2 times daily        09/02/22 2136    HYDROcodone-acetaminophen (NORCO/VICODIN) 5-325 MG tablet  Every 4 hours PRN        09/02/22 2136    ondansetron (ZOFRAN-ODT) 4 MG disintegrating tablet  Every 8 hours PRN        09/02/22 2136              Jacalyn Lefevre, MD 09/02/22 2140

## 2022-09-02 NOTE — ED Notes (Signed)
Patient transported to Ultrasound 

## 2022-09-03 ENCOUNTER — Other Ambulatory Visit (HOSPITAL_COMMUNITY): Payer: Self-pay

## 2022-09-03 ENCOUNTER — Other Ambulatory Visit: Payer: Self-pay

## 2022-09-04 ENCOUNTER — Other Ambulatory Visit (HOSPITAL_COMMUNITY): Payer: Self-pay

## 2022-09-07 NOTE — Telephone Encounter (Signed)
Fax came from [facility] that her cpap order has been voided due to the fact they can not contact the patient. She can contact [facility] at [phone] to get rescheduled.

## 2022-09-27 ENCOUNTER — Other Ambulatory Visit (HOSPITAL_COMMUNITY): Payer: Self-pay | Admitting: Internal Medicine

## 2022-09-28 ENCOUNTER — Other Ambulatory Visit (HOSPITAL_COMMUNITY): Payer: Self-pay

## 2022-09-28 MED ORDER — POTASSIUM CHLORIDE CRYS ER 20 MEQ PO TBCR
20.0000 meq | EXTENDED_RELEASE_TABLET | Freq: Every day | ORAL | 3 refills | Status: DC
  Filled 2022-09-28 – 2022-10-18 (×2): qty 30, 30d supply, fill #0

## 2022-10-08 ENCOUNTER — Other Ambulatory Visit (HOSPITAL_COMMUNITY): Payer: Self-pay

## 2022-10-19 ENCOUNTER — Other Ambulatory Visit: Payer: Self-pay

## 2022-10-19 ENCOUNTER — Encounter (HOSPITAL_COMMUNITY): Payer: Self-pay

## 2022-10-19 ENCOUNTER — Other Ambulatory Visit (HOSPITAL_COMMUNITY): Payer: Self-pay

## 2022-10-19 ENCOUNTER — Emergency Department (HOSPITAL_COMMUNITY)
Admission: EM | Admit: 2022-10-19 | Discharge: 2022-10-19 | Disposition: A | Discharge status: Home / Self Care | Payer: Commercial Managed Care - HMO | Attending: Emergency Medicine | Admitting: Emergency Medicine

## 2022-10-19 ENCOUNTER — Emergency Department (HOSPITAL_COMMUNITY): Payer: Commercial Managed Care - HMO

## 2022-10-19 DIAGNOSIS — R000 Tachycardia, unspecified: Secondary | ICD-10-CM | POA: Insufficient documentation

## 2022-10-19 DIAGNOSIS — R112 Nausea with vomiting, unspecified: Secondary | ICD-10-CM | POA: Insufficient documentation

## 2022-10-19 DIAGNOSIS — Z79899 Other long term (current) drug therapy: Secondary | ICD-10-CM | POA: Insufficient documentation

## 2022-10-19 DIAGNOSIS — I110 Hypertensive heart disease with heart failure: Secondary | ICD-10-CM | POA: Diagnosis not present

## 2022-10-19 DIAGNOSIS — J40 Bronchitis, not specified as acute or chronic: Secondary | ICD-10-CM | POA: Diagnosis not present

## 2022-10-19 DIAGNOSIS — I509 Heart failure, unspecified: Secondary | ICD-10-CM | POA: Insufficient documentation

## 2022-10-19 DIAGNOSIS — Z1152 Encounter for screening for COVID-19: Secondary | ICD-10-CM | POA: Diagnosis not present

## 2022-10-19 DIAGNOSIS — R079 Chest pain, unspecified: Secondary | ICD-10-CM | POA: Diagnosis present

## 2022-10-19 LAB — HEPATIC FUNCTION PANEL
ALT: 21 U/L (ref 0–44)
AST: 28 U/L (ref 15–41)
Albumin: 3.6 g/dL (ref 3.5–5.0)
Alkaline Phosphatase: 74 U/L (ref 38–126)
Bilirubin, Direct: 0.3 mg/dL — ABNORMAL HIGH (ref 0.0–0.2)
Indirect Bilirubin: 1.2 mg/dL — ABNORMAL HIGH (ref 0.3–0.9)
Total Bilirubin: 1.5 mg/dL — ABNORMAL HIGH (ref 0.3–1.2)
Total Protein: 7.1 g/dL (ref 6.5–8.1)

## 2022-10-19 LAB — BASIC METABOLIC PANEL
Anion gap: 12 (ref 5–15)
BUN: 12 mg/dL (ref 6–20)
CO2: 22 mmol/L (ref 22–32)
Calcium: 9.4 mg/dL (ref 8.9–10.3)
Chloride: 107 mmol/L (ref 98–111)
Creatinine, Ser: 1.04 mg/dL — ABNORMAL HIGH (ref 0.44–1.00)
GFR, Estimated: >60 mL/min (ref >60)
Glucose, Bld: 104 mg/dL — ABNORMAL HIGH (ref 70–99)
Potassium: 4.1 mmol/L (ref 3.5–5.1)
Sodium: 141 mmol/L (ref 135–145)

## 2022-10-19 LAB — CBC
HCT: 36.2 % (ref 36.0–46.0)
Hemoglobin: 11.2 g/dL — ABNORMAL LOW (ref 12.0–15.0)
MCH: 28.6 pg (ref 26.0–34.0)
MCHC: 30.9 g/dL (ref 30.0–36.0)
MCV: 92.3 fL (ref 80.0–100.0)
Platelets: 290 10*3/uL (ref 150–400)
RBC: 3.92 MIL/uL (ref 3.87–5.11)
RDW: 15 % (ref 11.5–15.5)
WBC: 5.5 10*3/uL (ref 4.0–10.5)
nRBC: 0 % (ref 0.0–0.2)

## 2022-10-19 LAB — RESP PANEL BY RT-PCR (RSV, FLU A&B, COVID)  RVPGX2
Influenza A by PCR: NEGATIVE
Influenza B by PCR: NEGATIVE
Resp Syncytial Virus by PCR: NEGATIVE
SARS Coronavirus 2 by RT PCR: NEGATIVE

## 2022-10-19 LAB — TROPONIN I (HIGH SENSITIVITY)
Troponin I (High Sensitivity): 58 ng/L — ABNORMAL HIGH (ref <18)
Troponin I (High Sensitivity): 55 ng/L — ABNORMAL HIGH (ref <18)

## 2022-10-19 LAB — LIPASE, BLOOD: Lipase: 29 U/L (ref 11–51)

## 2022-10-19 MED ORDER — ASPIRIN 81 MG PO CHEW
324.0000 mg | CHEWABLE_TABLET | Freq: Once | ORAL | Status: AC
  Administered 2022-10-19: 324 mg via ORAL
  Filled 2022-10-19: qty 4

## 2022-10-19 MED ORDER — MORPHINE SULFATE (PF) 4 MG/ML IV SOLN
4.0000 mg | Freq: Once | INTRAVENOUS | Status: AC
  Administered 2022-10-19: 4 mg via INTRAVENOUS
  Filled 2022-10-19: qty 1

## 2022-10-19 MED ORDER — NITROGLYCERIN 2 % TD OINT
1.0000 [in_us] | TOPICAL_OINTMENT | Freq: Four times a day (QID) | TRANSDERMAL | Status: DC
  Administered 2022-10-19: 1 [in_us] via TOPICAL
  Filled 2022-10-19: qty 1

## 2022-10-19 MED ORDER — ONDANSETRON HCL 4 MG/2ML IJ SOLN
4.0000 mg | Freq: Once | INTRAMUSCULAR | Status: AC
  Administered 2022-10-19: 4 mg via INTRAVENOUS
  Filled 2022-10-19: qty 2

## 2022-10-19 MED ORDER — DOXYCYCLINE HYCLATE 100 MG PO TABS
100.0000 mg | ORAL_TABLET | Freq: Once | ORAL | Status: AC
  Administered 2022-10-19: 100 mg via ORAL
  Filled 2022-10-19: qty 1

## 2022-10-19 MED ORDER — IOHEXOL 350 MG/ML SOLN
75.0000 mL | Freq: Once | INTRAVENOUS | Status: AC | PRN
  Administered 2022-10-19: 75 mL via INTRAVENOUS

## 2022-10-19 MED ORDER — DOXYCYCLINE HYCLATE 100 MG PO TABS
100.0000 mg | ORAL_TABLET | Freq: Two times a day (BID) | ORAL | 0 refills | Status: DC
  Filled 2022-10-20: qty 14, 7d supply, fill #0

## 2022-10-19 NOTE — Discharge Instructions (Signed)
Take the antibiotics as prescribed.  Follow-up with your doctor this week to be rechecked.  Return to the ED for worsening symptoms high fevers or other concerns

## 2022-10-19 NOTE — ED Notes (Signed)
Patient verbalizes understanding of discharge instructions. Opportunity for questioning and answers were provided. Armband removed by staff, pt discharged from ED. Pt ambulatory to ED waiting room with steady gait.  

## 2022-10-19 NOTE — ED Provider Notes (Signed)
[place] [facility]GENC[facility] DEPARTMENT AT [facility] Provider Note   CSN: [id number] Arrival date & time: 10/19/22  1401     History  Chief Complaint  Patient presents with   Chest Pain    Alexandria Rhodes is a 47 y.o. female.   Chest Pain  Patient has history of hypertension pulmonary embolism CHF.  Patient presents ED with complaints of sharp chest pain in the midsternum ongoing for the last 3 days.  Patient states today she started having episodes of nausea and vomiting.  She denies any abdominal pain.  She denies any fevers or chills.  No leg swelling.    Home Medications Prior to Admission medications   Medication Sig Start Date End Date Taking? Authorizing Provider  acetaminophen (TYLENOL) 500 MG tablet Take 1,000-1,500 mg by mouth 2 (two) times daily as needed for mild pain or headache.   Yes [provider]  dapagliflozin propanediol (FARXIGA) 10 MG TABS tablet Take 1 tablet (10 mg total) by mouth daily. 07/14/22  Yes Bensimhon, Bevelyn Buckles, MD  doxycycline (VIBRA-TABS) 100 MG tablet Take 1 tablet (100 mg total) by mouth 2 (two) times daily. 10/19/22  Yes Linwood Dibbles, MD  furosemide (LASIX) 40 MG tablet Take 1 tablet (40 mg total) by mouth daily. 06/19/22  Yes Bensimhon, Bevelyn Buckles, MD  omeprazole (PRILOSEC) 20 MG capsule Take 1 capsule (20 mg total) by mouth in the morning 30 minutes before morning meal Patient taking differently: Take 20 mg by mouth daily. 07/23/22  Yes   ondansetron (ZOFRAN-ODT) 4 MG disintegrating tablet Take 1 tablet (4 mg total) by mouth every 8 (eight) hours as needed for nausea or vomiting. 09/02/22  Yes Jacalyn Lefevre, MD  potassium chloride SA (KLOR-CON M) 20 MEQ tablet Take 1 tablet (20 mEq total) by mouth daily. 09/28/22  Yes Bensimhon, Bevelyn Buckles, MD  spironolactone (ALDACTONE) 25 MG tablet Take 1/2 tablet (12.5 mg total) by mouth daily. 04/30/22  Yes Bensimhon, Bevelyn Buckles, MD  SUMAtriptan (IMITREX) 50 MG tablet Take 50 mg by mouth every 2 (two) hours  as needed for migraine. May repeat in 2 hours if headache persists or recurs.   Yes [provider]  HYDROcodone-acetaminophen (NORCO/VICODIN) 5-325 MG tablet Take 1 tablet by mouth every 4 (four) hours as needed. Patient not taking: Reported on 10/19/2022 09/02/22   Jacalyn Lefevre, MD  ibuprofen (ADVIL) 400 MG tablet Take 1 tablet (400 mg total) by mouth 3 (three) times daily as needed for 7 days with food or milk Patient not taking: Reported on 10/19/2022 06/19/22         Allergies    Shellfish allergy    Review of Systems   Review of Systems  Cardiovascular:  Positive for chest pain.    Physical Exam Updated Vital Signs BP (!) 133/93   Pulse 100   Temp 97.9 F (36.6 C) (Oral)   Resp 20   Ht 1.549 m (5\' 1" )   Wt 73 kg   LMP 03/27/2014 (Approximate)   SpO2 98%   BMI 30.41 kg/m  Physical Exam Vitals and nursing note reviewed.  Constitutional:      Appearance: She is well-developed. She is not diaphoretic.  HENT:     Head: Normocephalic and atraumatic.     Right Ear: External ear normal.     Left Ear: External ear normal.  Eyes:     General: No scleral icterus.       Right eye: No discharge.  Left eye: No discharge.     Conjunctiva/sclera: Conjunctivae normal.  Neck:     Trachea: No tracheal deviation.  Cardiovascular:     Rate and Rhythm: Regular rhythm. Tachycardia present.  Pulmonary:     Effort: Pulmonary effort is normal. No respiratory distress.     Breath sounds: Normal breath sounds. No stridor. No wheezing or rales.  Abdominal:     General: Bowel sounds are normal. There is no distension.     Palpations: Abdomen is soft.     Tenderness: There is no abdominal tenderness. There is no guarding or rebound.  Musculoskeletal:        General: No tenderness or deformity.     Cervical back: Neck supple.     Right lower leg: No tenderness. No edema.     Left lower leg: No tenderness. No edema.  Skin:    General: Skin is warm and dry.     Findings: No  rash.  Neurological:     General: No focal deficit present.     Mental Status: She is alert.     Cranial Nerves: No cranial nerve deficit, dysarthria or facial asymmetry.     Sensory: No sensory deficit.     Motor: No abnormal muscle tone or seizure activity.     Coordination: Coordination normal.  Psychiatric:        Mood and Affect: Mood normal.     ED Results / Procedures / Treatments   Labs (all labs ordered are listed, but only abnormal results are displayed) Labs Reviewed  BASIC METABOLIC PANEL - Abnormal; Notable for the following components:      Result Value   Glucose, Bld 104 (*)    Creatinine, Ser 1.04 (*)    All other components within normal limits  CBC - Abnormal; Notable for the following components:   Hemoglobin 11.2 (*)    All other components within normal limits  HEPATIC FUNCTION PANEL - Abnormal; Notable for the following components:   Total Bilirubin 1.5 (*)    Bilirubin, Direct 0.3 (*)    Indirect Bilirubin 1.2 (*)    All other components within normal limits  TROPONIN I (HIGH SENSITIVITY) - Abnormal; Notable for the following components:   Troponin I (High Sensitivity) 58 (*)    All other components within normal limits  TROPONIN I (HIGH SENSITIVITY) - Abnormal; Notable for the following components:   Troponin I (High Sensitivity) 55 (*)    All other components within normal limits  RESP PANEL BY RT-PCR (RSV, FLU A&B, COVID)  RVPGX2  LIPASE, BLOOD    EKG EKG Interpretation Date/Time:  Monday October 19 2022 14:05:35 EDT Ventricular Rate:  103 PR Interval:  196 QRS Duration:  102 QT Interval:  372 QTC Calculation: 487 R Axis:   75  Text Interpretation: Sinus tachycardia T wave abnormality, consider lateral ischemia Abnormal ECG No significant change since last tracing Confirmed by Linwood Dibbles [phone]) on 10/19/2022 5:41:35 PM  Radiology CT Angio Chest PE W and/or Wo Contrast  Result Date: 10/19/2022 CLINICAL DATA:  Suspected pulmonary embolism.  EXAM: CT ANGIOGRAPHY CHEST WITH CONTRAST TECHNIQUE: Multidetector CT imaging of the chest was performed using the standard protocol during bolus administration of intravenous contrast. Multiplanar CT image reconstructions and MIPs were obtained to evaluate the vascular anatomy. RADIATION DOSE REDUCTION: This exam was performed according to the departmental dose-optimization program which includes automated exposure control, adjustment of the mA and/or kV according to patient size and/or use of iterative reconstruction technique.  CONTRAST:  75mL OMNIPAQUE IOHEXOL 350 MG/ML SOLN COMPARISON:  September 02, 2022 FINDINGS: Cardiovascular: The thoracic aorta is normal in appearance. Satisfactory opacification of the pulmonary arteries to the segmental level. No evidence of pulmonary embolism. There is mild to moderate severity cardiomegaly with left ventricular enlargement. No pericardial effusion. Mediastinum/Nodes: No enlarged mediastinal, hilar, or axillary lymph nodes. Thyroid gland, trachea, and esophagus demonstrate no significant findings. Lungs/Pleura: Mild areas of ground-glass of appearance of the lung parenchyma are seen within the right upper lobe and right lower lobe. There is no evidence of a pleural effusion or pneumothorax. Upper Abdomen: No acute abnormality. Musculoskeletal: No chest wall abnormality. No acute or significant osseous findings. Review of the MIP images confirms the above findings. IMPRESSION: 1. No evidence of pulmonary embolism. 2. Mild areas of ground-glass appearance of the lung parenchyma within the right upper lobe and right lower lobe, which may be inflammatory in origin. 3. Mild to moderate severity cardiomegaly with left ventricular enlargement. Electronically Signed   By: Aram Candela M.D.   On: 10/19/2022 20:45   DG Chest 2 View  Result Date: 10/19/2022 CLINICAL DATA:  Chest pain. EXAM: CHEST - 2 VIEW COMPARISON:  September 02, 2022. FINDINGS: Stable cardiomegaly. Both lungs are  clear. The visualized skeletal structures are unremarkable. IMPRESSION: No active cardiopulmonary disease. Electronically Signed   By: Lupita Raider M.D.   On: 10/19/2022 15:29    Procedures Procedures    Medications Ordered in ED Medications  nitroGLYCERIN (NITROGLYN) 2 % ointment 1 inch (1 inch Topical Given 10/19/22 1924)  doxycycline (VIBRA-TABS) tablet 100 mg (has no administration in time range)  aspirin chewable tablet 324 mg (324 mg Oral Given 10/19/22 1906)  morphine (PF) 4 MG/ML injection 4 mg (4 mg Intravenous Given 10/19/22 1923)  ondansetron (ZOFRAN) injection 4 mg (4 mg Intravenous Given 10/19/22 1922)  iohexol (OMNIPAQUE) 350 MG/ML injection 75 mL (75 mLs Intravenous Contrast Given 10/19/22 2017)    ED Course/ Medical Decision Making/ A&P Clinical Course as of 10/19/22 2106  Mon Oct 19, 2022  1845 Troponin elevated, delta troponin unchanged.  CBC normal.  Metabolic panel normal. [JK]  2053 Hepatic function panel(!) Bilirubin increased, similar to previous [JK]  2054 ET angiogram does not show evidence of PE.  Cardiomegaly with left ventricular enlargement noted.  Groundglass opacities in the right upper and lower lobe.  Possible inflammatory in nature [JK]    Clinical Course User Index [JK] Linwood Dibbles, MD                                 Medical Decision Making Parental diagnosis includes but not limited to acute coronary syndrome, pneumonia, pulmonary embolism  Problems Addressed: Bronchitis: acute illness or injury that poses a threat to life or bodily functions  Amount and/or Complexity of Data Reviewed Labs: ordered. Decision-making details documented in ED Course. Radiology: ordered and independent interpretation performed.  Risk OTC drugs. Prescription drug management.   Presented to the ED for evaluation of chest pain.  She does have a history of CHF but no known history of coronary artery disease.  Patient also mentioned episodes of nausea vomiting and she  has been coughing.  ED workup did show elevated troponins but these are stable and patient does have elevated troponins in the past.  I suspect this is a function of her congestive heart failure and not acute coronary syndrome.  No evidence of acute hepatitis  or pancreatitis.  She has stable elevated bilirubin levels.  Patient also is not having any abdominal pain.  Concerned about the possibility of pulmonary embolism concerning her tachycardia and her symptoms.  CT angiogram was performed and no signs of PE or aortic abnormality.  Patient has stable cardiomegaly.  There is evidence of inflammatory changes in the lungs.  Patient has been coughing.  Will start her on a course of doxycycline for bronchitis possible pneumonia.  Will also send COVID test.  Evaluation and diagnostic testing in the emergency department does not suggest an emergent condition requiring admission or immediate intervention beyond what has been performed at this time.  The patient is safe for discharge and has been instructed to return immediately for worsening symptoms, change in symptoms or any other concerns.         Final Clinical Impression(s) / ED Diagnoses Final diagnoses:  Bronchitis    Rx / DC Orders ED Discharge Orders          Ordered    doxycycline (VIBRA-TABS) 100 MG tablet  2 times daily        10/19/22 2103              Linwood Dibbles, MD 10/19/22 2106

## 2022-10-19 NOTE — ED Triage Notes (Signed)
Pt c/o non radiating midsternal chest painx3d. N/V started yesterday

## 2022-10-20 ENCOUNTER — Other Ambulatory Visit (HOSPITAL_COMMUNITY): Payer: Self-pay

## 2022-11-26 ENCOUNTER — Inpatient Hospital Stay (HOSPITAL_COMMUNITY)
Admission: EM | Admit: 2022-11-26 | Discharge: 2022-12-03 | DRG: 286 | Disposition: A | Discharge status: Home / Self Care | Payer: Commercial Managed Care - HMO | Attending: Internal Medicine | Admitting: Internal Medicine

## 2022-11-26 ENCOUNTER — Other Ambulatory Visit: Payer: Self-pay

## 2022-11-26 DIAGNOSIS — I513 Intracardiac thrombosis, not elsewhere classified: Principal | ICD-10-CM | POA: Diagnosis present

## 2022-11-26 DIAGNOSIS — I110 Hypertensive heart disease with heart failure: Secondary | ICD-10-CM | POA: Diagnosis present

## 2022-11-26 DIAGNOSIS — N179 Acute kidney failure, unspecified: Secondary | ICD-10-CM | POA: Diagnosis not present

## 2022-11-26 DIAGNOSIS — I472 Ventricular tachycardia, unspecified: Secondary | ICD-10-CM | POA: Diagnosis present

## 2022-11-26 DIAGNOSIS — Z7984 Long term (current) use of oral hypoglycemic drugs: Secondary | ICD-10-CM

## 2022-11-26 DIAGNOSIS — Z79899 Other long term (current) drug therapy: Secondary | ICD-10-CM

## 2022-11-26 DIAGNOSIS — Z8249 Family history of ischemic heart disease and other diseases of the circulatory system: Secondary | ICD-10-CM

## 2022-11-26 DIAGNOSIS — Z87892 Personal history of anaphylaxis: Secondary | ICD-10-CM

## 2022-11-26 DIAGNOSIS — I519 Heart disease, unspecified: Secondary | ICD-10-CM

## 2022-11-26 DIAGNOSIS — N289 Disorder of kidney and ureter, unspecified: Secondary | ICD-10-CM

## 2022-11-26 DIAGNOSIS — Z5982 Transportation insecurity: Secondary | ICD-10-CM

## 2022-11-26 DIAGNOSIS — I420 Dilated cardiomyopathy: Secondary | ICD-10-CM | POA: Diagnosis present

## 2022-11-26 DIAGNOSIS — Z5986 Financial insecurity: Secondary | ICD-10-CM

## 2022-11-26 DIAGNOSIS — Z87891 Personal history of nicotine dependence: Secondary | ICD-10-CM

## 2022-11-26 DIAGNOSIS — Z91148 Patient's other noncompliance with medication regimen for other reason: Secondary | ICD-10-CM

## 2022-11-26 DIAGNOSIS — K219 Gastro-esophageal reflux disease without esophagitis: Secondary | ICD-10-CM | POA: Diagnosis present

## 2022-11-26 DIAGNOSIS — Z7901 Long term (current) use of anticoagulants: Secondary | ICD-10-CM

## 2022-11-26 DIAGNOSIS — Z86711 Personal history of pulmonary embolism: Secondary | ICD-10-CM

## 2022-11-26 DIAGNOSIS — D735 Infarction of spleen: Secondary | ICD-10-CM | POA: Diagnosis present

## 2022-11-26 DIAGNOSIS — I5022 Chronic systolic (congestive) heart failure: Secondary | ICD-10-CM | POA: Diagnosis present

## 2022-11-26 DIAGNOSIS — I10 Essential (primary) hypertension: Secondary | ICD-10-CM | POA: Diagnosis present

## 2022-11-26 DIAGNOSIS — I5043 Acute on chronic combined systolic (congestive) and diastolic (congestive) heart failure: Secondary | ICD-10-CM | POA: Diagnosis not present

## 2022-11-26 DIAGNOSIS — Z91013 Allergy to seafood: Secondary | ICD-10-CM

## 2022-11-26 DIAGNOSIS — N280 Ischemia and infarction of kidney: Secondary | ICD-10-CM | POA: Diagnosis present

## 2022-11-26 DIAGNOSIS — I4719 Other supraventricular tachycardia: Secondary | ICD-10-CM | POA: Diagnosis present

## 2022-11-26 DIAGNOSIS — T45516A Underdosing of anticoagulants, initial encounter: Secondary | ICD-10-CM | POA: Diagnosis present

## 2022-11-26 LAB — CBC WITH DIFFERENTIAL/PLATELET
Abs Immature Granulocytes: 0.02 10*3/uL (ref 0.00–0.07)
Basophils Absolute: 0 10*3/uL (ref 0.0–0.1)
Basophils Relative: 0 %
Eosinophils Absolute: 0.1 10*3/uL (ref 0.0–0.5)
Eosinophils Relative: 1 %
HCT: 40.5 % (ref 36.0–46.0)
Hemoglobin: 12.5 g/dL (ref 12.0–15.0)
Immature Granulocytes: 0 %
Lymphocytes Relative: 21 %
Lymphs Abs: 1.6 10*3/uL (ref 0.7–4.0)
MCH: 27.7 pg (ref 26.0–34.0)
MCHC: 30.9 g/dL (ref 30.0–36.0)
MCV: 89.8 fL (ref 80.0–100.0)
Monocytes Absolute: 0.4 10*3/uL (ref 0.1–1.0)
Monocytes Relative: 6 %
Neutro Abs: 5.5 10*3/uL (ref 1.7–7.7)
Neutrophils Relative %: 72 %
Platelets: 303 10*3/uL (ref 150–400)
RBC: 4.51 MIL/uL (ref 3.87–5.11)
RDW: 14.8 % (ref 11.5–15.5)
WBC: 7.6 10*3/uL (ref 4.0–10.5)
nRBC: 0 % (ref 0.0–0.2)

## 2022-11-26 LAB — URINALYSIS, ROUTINE W REFLEX MICROSCOPIC
Bilirubin Urine: NEGATIVE
Glucose, UA: >=500 mg/dL — AB
Ketones, ur: 5 mg/dL — AB
Leukocytes,Ua: NEGATIVE
Nitrite: NEGATIVE
Protein, ur: NEGATIVE mg/dL
Specific Gravity, Urine: 1.022 (ref 1.005–1.030)
pH: 5 (ref 5.0–8.0)

## 2022-11-26 NOTE — ED Triage Notes (Signed)
Patient reports persistent left flank pain for several weeks , denies injury , no hematuria or dysuria . Pain increases with movement / certain positions .

## 2022-11-27 ENCOUNTER — Encounter (HOSPITAL_COMMUNITY): Payer: Self-pay | Admitting: Internal Medicine

## 2022-11-27 ENCOUNTER — Emergency Department (HOSPITAL_COMMUNITY): Payer: Commercial Managed Care - HMO

## 2022-11-27 ENCOUNTER — Observation Stay (HOSPITAL_COMMUNITY): Payer: Commercial Managed Care - HMO

## 2022-11-27 DIAGNOSIS — I5022 Chronic systolic (congestive) heart failure: Secondary | ICD-10-CM | POA: Diagnosis not present

## 2022-11-27 DIAGNOSIS — I428 Other cardiomyopathies: Secondary | ICD-10-CM | POA: Diagnosis not present

## 2022-11-27 DIAGNOSIS — N280 Ischemia and infarction of kidney: Secondary | ICD-10-CM | POA: Diagnosis not present

## 2022-11-27 DIAGNOSIS — I513 Intracardiac thrombosis, not elsewhere classified: Principal | ICD-10-CM

## 2022-11-27 DIAGNOSIS — I420 Dilated cardiomyopathy: Secondary | ICD-10-CM

## 2022-11-27 DIAGNOSIS — I10 Essential (primary) hypertension: Secondary | ICD-10-CM

## 2022-11-27 DIAGNOSIS — N289 Disorder of kidney and ureter, unspecified: Secondary | ICD-10-CM

## 2022-11-27 DIAGNOSIS — Z86711 Personal history of pulmonary embolism: Secondary | ICD-10-CM

## 2022-11-27 LAB — COMPREHENSIVE METABOLIC PANEL
ALT: 21 U/L (ref 0–44)
AST: 86 U/L — ABNORMAL HIGH (ref 15–41)
Albumin: 4.1 g/dL (ref 3.5–5.0)
Alkaline Phosphatase: 73 U/L (ref 38–126)
Anion gap: 16 — ABNORMAL HIGH (ref 5–15)
BUN: 10 mg/dL (ref 6–20)
CO2: 23 mmol/L (ref 22–32)
Calcium: 9.8 mg/dL (ref 8.9–10.3)
Chloride: 102 mmol/L (ref 98–111)
Creatinine, Ser: 1.31 mg/dL — ABNORMAL HIGH (ref 0.44–1.00)
GFR, Estimated: 50 mL/min — ABNORMAL LOW (ref >60)
Glucose, Bld: 116 mg/dL — ABNORMAL HIGH (ref 70–99)
Potassium: 4.4 mmol/L (ref 3.5–5.1)
Sodium: 141 mmol/L (ref 135–145)
Total Bilirubin: 1 mg/dL (ref 0.3–1.2)
Total Protein: 7.9 g/dL (ref 6.5–8.1)

## 2022-11-27 LAB — ECHOCARDIOGRAM COMPLETE
Area-P 1/2: 3.27 cm2
Est EF: 15
Height: 61 in
MV M vel: 3.73 m/s
MV Peak grad: 55.7 mmHg
Radius: 0.7 cm
S' Lateral: 6.6 cm
Weight: 2592 [oz_av]

## 2022-11-27 LAB — HCG, QUANTITATIVE, PREGNANCY: hCG, Beta Chain, Quant, S: 10 m[IU]/mL — ABNORMAL HIGH (ref <5)

## 2022-11-27 LAB — HIV ANTIBODY (ROUTINE TESTING W REFLEX): HIV Screen 4th Generation wRfx: NONREACTIVE

## 2022-11-27 LAB — POC URINE PREG, ED: Preg Test, Ur: NEGATIVE

## 2022-11-27 LAB — TROPONIN I (HIGH SENSITIVITY): Troponin I (High Sensitivity): >24000 ng/L (ref <18)

## 2022-11-27 LAB — BRAIN NATRIURETIC PEPTIDE: B Natriuretic Peptide: 1684.1 pg/mL — ABNORMAL HIGH (ref 0.0–100.0)

## 2022-11-27 MED ORDER — DAPAGLIFLOZIN PROPANEDIOL 10 MG PO TABS
10.0000 mg | ORAL_TABLET | Freq: Every day | ORAL | Status: DC
  Administered 2022-11-27 – 2022-11-28 (×2): 10 mg via ORAL
  Filled 2022-11-27 (×2): qty 1

## 2022-11-27 MED ORDER — ACETAMINOPHEN 650 MG RE SUPP: 650.0000 mg | Freq: Four times a day (QID) | RECTAL | Status: DC | PRN

## 2022-11-27 MED ORDER — ALBUTEROL SULFATE (2.5 MG/3ML) 0.083% IN NEBU: 2.5000 mg | INHALATION_SOLUTION | Freq: Four times a day (QID) | RESPIRATORY_TRACT | Status: DC | PRN

## 2022-11-27 MED ORDER — SODIUM CHLORIDE 0.9% FLUSH
3.0000 mL | Freq: Two times a day (BID) | INTRAVENOUS | Status: DC
  Administered 2022-11-27 – 2022-12-03 (×10): 3 mL via INTRAVENOUS

## 2022-11-27 MED ORDER — SPIRONOLACTONE 12.5 MG HALF TABLET
12.5000 mg | ORAL_TABLET | Freq: Every day | ORAL | Status: DC
  Administered 2022-11-27: 12.5 mg via ORAL
  Filled 2022-11-27: qty 1

## 2022-11-27 MED ORDER — OXYCODONE-ACETAMINOPHEN 5-325 MG PO TABS
2.0000 | ORAL_TABLET | Freq: Once | ORAL | Status: AC
  Administered 2022-11-27: 2 via ORAL
  Filled 2022-11-27: qty 2

## 2022-11-27 MED ORDER — PERFLUTREN LIPID MICROSPHERE
1.0000 mL | INTRAVENOUS | Status: AC | PRN
  Administered 2022-11-27: 4 mL via INTRAVENOUS

## 2022-11-27 MED ORDER — HEPARIN BOLUS VIA INFUSION
4500.0000 [IU] | Freq: Once | INTRAVENOUS | Status: AC
  Administered 2022-11-27: 4500 [IU] via INTRAVENOUS
  Filled 2022-11-27: qty 4500

## 2022-11-27 MED ORDER — IOHEXOL 350 MG/ML SOLN
75.0000 mL | Freq: Once | INTRAVENOUS | Status: AC | PRN
  Administered 2022-11-27: 75 mL via INTRAVENOUS

## 2022-11-27 MED ORDER — ONDANSETRON HCL 4 MG/2ML IJ SOLN: 4.0000 mg | Freq: Four times a day (QID) | INTRAMUSCULAR | Status: DC | PRN

## 2022-11-27 MED ORDER — ONDANSETRON HCL 4 MG PO TABS: 4.0000 mg | ORAL_TABLET | Freq: Four times a day (QID) | ORAL | Status: DC | PRN

## 2022-11-27 MED ORDER — HYDROCODONE-ACETAMINOPHEN 5-325 MG PO TABS
1.0000 | ORAL_TABLET | Freq: Four times a day (QID) | ORAL | Status: DC | PRN
  Administered 2022-11-27 – 2022-12-03 (×8): 1 via ORAL
  Filled 2022-11-27 (×8): qty 1

## 2022-11-27 MED ORDER — APIXABAN 5 MG PO TABS
5.0000 mg | ORAL_TABLET | Freq: Two times a day (BID) | ORAL | Status: DC
  Administered 2022-11-27 – 2022-11-28 (×3): 5 mg via ORAL
  Filled 2022-11-27 (×3): qty 1

## 2022-11-27 MED ORDER — FUROSEMIDE 40 MG PO TABS
40.0000 mg | ORAL_TABLET | Freq: Every day | ORAL | Status: DC
  Administered 2022-11-27: 40 mg via ORAL
  Filled 2022-11-27: qty 2

## 2022-11-27 MED ORDER — HEPARIN (PORCINE) 25000 UT/250ML-% IV SOLN
1200.0000 [IU]/h | INTRAVENOUS | Status: DC
  Administered 2022-11-27: 1200 [IU]/h via INTRAVENOUS
  Filled 2022-11-27: qty 250

## 2022-11-27 MED ORDER — LOSARTAN POTASSIUM 25 MG PO TABS
25.0000 mg | ORAL_TABLET | Freq: Every day | ORAL | Status: DC
  Administered 2022-11-27 – 2022-12-03 (×6): 25 mg via ORAL
  Filled 2022-11-27 (×6): qty 1

## 2022-11-27 MED ORDER — ACETAMINOPHEN 325 MG PO TABS
650.0000 mg | ORAL_TABLET | Freq: Four times a day (QID) | ORAL | Status: DC | PRN
  Administered 2022-12-01: 650 mg via ORAL
  Filled 2022-11-27: qty 2

## 2022-11-27 MED ORDER — GABAPENTIN 100 MG PO CAPS
100.0000 mg | ORAL_CAPSULE | Freq: Once | ORAL | Status: AC
  Administered 2022-11-27: 100 mg via ORAL
  Filled 2022-11-27: qty 1

## 2022-11-27 NOTE — Progress Notes (Signed)
Echocardiogram 2D Echocardiogram has been performed.  Warren Lacy Kaitlin Ardito RDCS 11/27/2022, 12:06 PM

## 2022-11-27 NOTE — H&P (Addendum)
History and Physical    Patient: Alexandria Rhodes WUJ:[id number] DOB: 1975/05/15 DOA: 11/26/2022 DOS: the patient was seen and examined on 11/27/2022 PCP: Lorenda Ishihara, MD  Patient coming from: Home  Chief Complaint:  Chief Complaint  Patient presents with   Flank Pain   HPI: Alexandria Rhodes is a 47 y.o. female with medical history significant of hypertension, HFrEF, history of pulmonary embolism not on anticoagulation, and GERD presents with complaints of left-sided flank pain.  Symptoms have been present over the last 2 weeks.  Pain was initially noted to be dull, but has been more sharp and shooting lately.  She has been having associated symptoms of intermittent cough, shortness of breath, nausea, vomiting, and diarrhea.  Symptoms seem to be worsened with movement.  Denies having any significant fever, chills, dysuria,, recent sick contacts to her knowledge.  In the emergency department patient was noted to be afebrile with blood pressures 132/97 to 133/95, and all other vital signs maintained.  Labs significant for creatinine 1.31, BUN 10, AST 86.  Urine pregnancy screen was negative.  Urinalysis noted small hemoglobin but no significant signs for infection.  CT scan of the abdomen and pelvis with contrast noted segment hypoperfusion of the left kidney compatible with infarction with interval development of nonacute splenic infarct since 12/03/2022, cardiomegaly with plaque-like hypodensity along the endocardial surface towards the left ventricular apex concerning for adherent thrombus in the left ventricle.  Cardiology have been formally consulted.  Patient was placed on a heparin drip and cardiology have been consulted..   review of Systems: As mentioned in the history of present illness. All other systems reviewed and are negative. Past Medical History:  Diagnosis Date   Acute pulmonary embolism (HCC)    Cardiomyopathy (HCC)    CHF (congestive heart failure) (HCC)    Hypertension     Non-ischemic cardiomyopathy (HCC) 07/2019   No past surgical history on file. Social History:  reports that she has quit smoking. Her smoking use included cigarettes. She has never used smokeless tobacco. She reports that she does not currently use alcohol. She reports that she does not currently use drugs.  Allergies  Allergen Reactions   Shellfish Allergy Anaphylaxis and Hives    Family History  Problem Relation Age of Onset   Hypertension Mother     Prior to Admission medications   Medication Sig Start Date End Date Taking? Authorizing Provider  acetaminophen (TYLENOL) 500 MG tablet Take 1,000-1,500 mg by mouth 2 (two) times daily as needed for mild pain or headache.    [provider]  dapagliflozin propanediol (FARXIGA) 10 MG TABS tablet Take 1 tablet (10 mg total) by mouth daily. 07/14/22   Bensimhon, Bevelyn Buckles, MD  doxycycline (VIBRA-TABS) 100 MG tablet Take 1 tablet (100 mg total) by mouth 2 (two) times daily. 10/19/22   Linwood Dibbles, MD  furosemide (LASIX) 40 MG tablet Take 1 tablet (40 mg total) by mouth daily. 06/19/22   Bensimhon, Bevelyn Buckles, MD  HYDROcodone-acetaminophen (NORCO/VICODIN) 5-325 MG tablet Take 1 tablet by mouth every 4 (four) hours as needed. Patient not taking: Reported on 10/19/2022 09/02/22   Jacalyn Lefevre, MD  ibuprofen (ADVIL) 400 MG tablet Take 1 tablet (400 mg total) by mouth 3 (three) times daily as needed for 7 days with food or milk Patient not taking: Reported on 10/19/2022 06/19/22     omeprazole (PRILOSEC) 20 MG capsule Take 1 capsule (20 mg total) by mouth in the morning 30 minutes before morning meal Patient taking  differently: Take 20 mg by mouth daily. 07/23/22     ondansetron (ZOFRAN-ODT) 4 MG disintegrating tablet Take 1 tablet (4 mg total) by mouth every 8 (eight) hours as needed for nausea or vomiting. 09/02/22   Jacalyn Lefevre, MD  potassium chloride SA (KLOR-CON M) 20 MEQ tablet Take 1 tablet (20 mEq total) by mouth daily. 09/28/22   Bensimhon,  Bevelyn Buckles, MD  spironolactone (ALDACTONE) 25 MG tablet Take 1/2 tablet (12.5 mg total) by mouth daily. 04/30/22   Bensimhon, Bevelyn Buckles, MD  SUMAtriptan (IMITREX) 50 MG tablet Take 50 mg by mouth every 2 (two) hours as needed for migraine. May repeat in 2 hours if headache persists or recurs.    [provider]    Physical Exam: Vitals:   11/27/22 0414 11/27/22 0700 11/27/22 0745  BP: (!) 134/108  (!) 133/95  Pulse: 100  97  Resp: 18    Temp: 97.9 F (36.6 C)    TempSrc: Oral    SpO2: 99%  98%  Weight:  73.5 kg   Height:  5\' 1"  (1.549 m)     Constitutional: Middle-aged female currently in no acute distress. Eyes: PERRL, lids and conjunctivae normal ENMT: Mucous membranes are moist. Posterior pharynx clear of any exudate or lesions.  Neck: normal, supple, Respiratory: clear to auscultation bilaterally, no wheezing, no crackles. Normal respiratory effort. No accessory muscle use.  Cardiovascular: Regular rate and rhythm, no murmurs / rubs / gallops. No extremity edema.   Abdomen: no tenderness, no masses palpated.   Bowel sounds positive.  Musculoskeletal: no clubbing / cyanosis. No joint deformity upper and lower extremities. Good ROM, no contractures. Normal muscle tone.  Skin: no rashes, lesions, ulcers. No induration Neurologic: CN 2-12 grossly intact. Strength 5/5 in all 4.  Psychiatric: Normal judgment and insight. Alert and oriented x 3. Normal mood.   Data Reviewed:  reviewed labs, imaging, and pertinent records as documented in this note.  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  Urinalysis did not show significant signs of infection.  Patient found to have left kidney infarct, remote splenic infarct, and a LV thrombus.  Patient was started on a heparin drip. -Admit to a cardiac telemetry bed -Check echocardiogram -Check high-sensitivity troponin -Eliquis -Hydrocodone as  needed for pain  Chronic systolic CHF Dilated cardiomyopathy Patient does not appear grossly fluid overloaded at this time.  Last echocardiogram noted EF to be 30 to 35% with grade 2 diastolic dysfunction back in 06/2021.  -Check BNP -Resume furosemide and Aldactone when medically appropriate.  Essential hypertension Blood pressures with diastolic blood pressures elevated up to 108.  Cardiology started low-dose losartan 25 mg. -Monitor  Renal insufficiency Creatinine elevated up to 1.31 with BUN 10.  Urinalysis did not show any signs of infection.  CT head noted a left renal infarct. -Recheck kidney function in a.m.  History of pulmonary embolism Patient with prior pulmonary embolism back in 03/2018.  She reportedly only took Eliquis for 1 month due to cost.   DVT prophylaxis: Eliquis Advance Care Planning:   Code Status: Full Code    Consults: Cardiology  Family Communication: None requested  Severity of Illness: The appropriate patient status for this patient is OBSERVATION. Observation status is judged to be reasonable and necessary in order to provide the required intensity of service to ensure the patient's safety. The patient's presenting symptoms, physical exam findings, and initial radiographic and laboratory data in  the context of their medical condition is felt to place them at decreased risk for further clinical deterioration. Furthermore, it is anticipated that the patient will be medically stable for discharge from the hospital within 2 midnights of admission.   Author: Clydie Braun, MD 11/27/2022 8:15 AM  For on call review [email or web address].

## 2022-11-27 NOTE — ED Provider Notes (Signed)
[place] EMERGENCY DEPARTMENT AT [facility] Provider Note   CSN: [id number] Arrival date & time: 11/26/22  2313     History {Add pertinent medical, surgical, social history, OB history to HPI:1} Chief Complaint  Patient presents with   Flank Pain    Alexandria Rhodes is a 47 y.o. female.  46 year old female who presents the ER today secondary to flank pain.  Patient states she has had some lower back pain has been dull in nature for about a year but most recently over the last week or 2 she has had some sharp shooting pain from her left flank around to her left abdomen.  No urinary changes.  No trauma.  No fevers.  No incontinence or weakness.  Overall patient has no other associated symptoms.  She states that her dull back pain is related to movement but the sharp pain does not seem to be.   Flank Pain       Home Medications Prior to Admission medications   Medication Sig Start Date End Date Taking? Authorizing Provider  acetaminophen (TYLENOL) 500 MG tablet Take 1,000-1,500 mg by mouth 2 (two) times daily as needed for mild pain or headache.    [provider]  dapagliflozin propanediol (FARXIGA) 10 MG TABS tablet Take 1 tablet (10 mg total) by mouth daily. 07/14/22   Bensimhon, Bevelyn Buckles, MD  doxycycline (VIBRA-TABS) 100 MG tablet Take 1 tablet (100 mg total) by mouth 2 (two) times daily. 10/19/22   Linwood Dibbles, MD  furosemide (LASIX) 40 MG tablet Take 1 tablet (40 mg total) by mouth daily. 06/19/22   Bensimhon, Bevelyn Buckles, MD  HYDROcodone-acetaminophen (NORCO/VICODIN) 5-325 MG tablet Take 1 tablet by mouth every 4 (four) hours as needed. Patient not taking: Reported on 10/19/2022 09/02/22   Jacalyn Lefevre, MD  ibuprofen (ADVIL) 400 MG tablet Take 1 tablet (400 mg total) by mouth 3 (three) times daily as needed for 7 days with food or milk Patient not taking: Reported on 10/19/2022 06/19/22     omeprazole (PRILOSEC) 20 MG capsule Take 1 capsule (20 mg total) by mouth in  the morning 30 minutes before morning meal Patient taking differently: Take 20 mg by mouth daily. 07/23/22     ondansetron (ZOFRAN-ODT) 4 MG disintegrating tablet Take 1 tablet (4 mg total) by mouth every 8 (eight) hours as needed for nausea or vomiting. 09/02/22   Jacalyn Lefevre, MD  potassium chloride SA (KLOR-CON M) 20 MEQ tablet Take 1 tablet (20 mEq total) by mouth daily. 09/28/22   Bensimhon, Bevelyn Buckles, MD  spironolactone (ALDACTONE) 25 MG tablet Take 1/2 tablet (12.5 mg total) by mouth daily. 04/30/22   Bensimhon, Bevelyn Buckles, MD  SUMAtriptan (IMITREX) 50 MG tablet Take 50 mg by mouth every 2 (two) hours as needed for migraine. May repeat in 2 hours if headache persists or recurs.    [provider]      Allergies    Shellfish allergy    Review of Systems   Review of Systems  Genitourinary:  Positive for flank pain.    Physical Exam Updated Vital Signs BP (!) 134/108   Pulse 100   Temp 97.9 F (36.6 C) (Oral)   Resp 18   LMP 03/27/2014 (Approximate)   SpO2 99%  Physical Exam Vitals and nursing note reviewed.  Constitutional:      Appearance: She is well-developed.  HENT:     Head: Normocephalic and atraumatic.  Eyes:     Pupils: Pupils are equal,  round, and reactive to light.  Cardiovascular:     Rate and Rhythm: Normal rate and regular rhythm.  Pulmonary:     Effort: No respiratory distress.     Breath sounds: No stridor.  Abdominal:     General: There is no distension.  Musculoskeletal:        General: Tenderness (Tenderness to the left flank) present.     Cervical back: Normal range of motion.  Skin:    General: Skin is warm and dry.  Neurological:     General: No focal deficit present.     Mental Status: She is alert.     ED Results / Procedures / Treatments   Labs (all labs ordered are listed, but only abnormal results are displayed) Labs Reviewed  URINALYSIS, ROUTINE W REFLEX MICROSCOPIC - Abnormal; Notable for the following components:       Result Value   Glucose, UA >=500 (*)    Hgb urine dipstick SMALL (*)    Ketones, ur 5 (*)    Bacteria, UA RARE (*)    All other components within normal limits  COMPREHENSIVE METABOLIC PANEL - Abnormal; Notable for the following components:   Glucose, Bld 116 (*)    Creatinine, Ser 1.31 (*)    AST 86 (*)    GFR, Estimated 50 (*)    Anion gap 16 (*)    All other components within normal limits  HCG, QUANTITATIVE, PREGNANCY - Abnormal; Notable for the following components:   hCG, Beta Chain, Quant, S 10 (*)    All other components within normal limits  CBC WITH DIFFERENTIAL/PLATELET  POC URINE PREG, ED    EKG None  Radiology No results found.  Procedures Procedures  {Document cardiac monitor, telemetry assessment procedure when appropriate:1}  Medications Ordered in ED Medications  oxyCODONE-acetaminophen (PERCOCET/ROXICET) 5-325 MG per tablet 2 tablet (2 tablets Oral Given 11/27/22 0444)  iohexol (OMNIPAQUE) 350 MG/ML injection 75 mL (75 mLs Intravenous Contrast Given 11/27/22 0981)    ED Course/ Medical Decision Making/ A&P  {   Click here for ABCD2, HEART and other calculatorsREFRESH Note before signing :1}                              Medical Decision Making Amount and/or Complexity of Data Reviewed Labs: ordered. Radiology: ordered. Decision-making details documented in ED Course.  Risk Prescription drug management.   ***  {Document critical care time when appropriate:1} {Document review of labs and clinical decision tools ie heart score, Chads2Vasc2 etc:1}  {Document your independent review of radiology images, and any outside records:1} {Document your discussion with family members, caretakers, and with consultants:1} {Document social determinants of health affecting pt's care:1} {Document your decision making why or why not admission, treatments were needed:1} Final Clinical Impression(s) / ED Diagnoses Final diagnoses:  None    Rx / DC Orders ED  Discharge Orders     None

## 2022-11-27 NOTE — Progress Notes (Addendum)
   Paged after hour by hospitalist Dr Katrinka Blazing reporting patient has BNP 1684 and Hs trop >24000. She came in for left flank pain, CT showed apical thrombus. She has no chest pain at this time, left flank pain has been improving, BP stable, mildly tachycardia 110s. Reviewed chart and plan with Dr Eden Emms, advised that she has been started on Eliquis for anticoagulation, also placed on GDMT with farxiga and losartan so far. Echo showed LVEF declined to 15%. Dr Eden Emms recommend to consult advanced heart failure team for further management, message has been sent to Dr Gala Romney.   Received call back from Dr Gala Romney , recommend to place the patient on cath board Monday 11/30/22 under interventional team given Hs trop >24000. Will need left and right heart cath. She has been added, will need consent and orders before Monday if she remains appropriate for cath. She can remain on general cardiology round for the weekend and AHF team will take over Monday.

## 2022-11-27 NOTE — Progress Notes (Signed)
ANTICOAGULATION CONSULT NOTE  Pharmacy Consult for IV heparin Indication: LV thrombus  Allergies  Allergen Reactions   Shellfish Allergy Anaphylaxis and Hives    Patient Measurements: Weight: 73.5 kg (162 lb) Heparin Dosing Weight: 63.9 kg  Vital Signs: Temp: 97.9 F (36.6 C) (09/13 0414) Temp Source: Oral (09/13 0414) BP: 134/108 (09/13 0414) Pulse Rate: 100 (09/13 0414)  Labs: Recent Labs    11/26/22 2324  HGB 12.5  HCT 40.5  PLT 303  CREATININE 1.31*    Estimated Creatinine Clearance: 48.2 mL/min (A) (by C-G formula based on SCr of 1.31 mg/dL (H)).   Medical History: Past Medical History:  Diagnosis Date   Acute pulmonary embolism (HCC)    Cardiomyopathy (HCC)    CHF (congestive heart failure) (HCC)    Hypertension    Non-ischemic cardiomyopathy (HCC) 07/2019    Assessment: Alexandria Rhodes is a 47 y.o. year old female admitted on 11/26/2022 with concern for new LV thrombus. No anticoagulation prior to admission. PMH significant for hx of unprovoked PE, HTN and HFrF (EF 30-35% on 07/11/21). Pharmacy consulted to dose heparin.   Goal of Therapy:  Heparin level 0.3-0.7 units/ml Monitor platelets by anticoagulation protocol: Yes   Plan:  Heparin 4500 units x 1 as bolus followed by heparin infusion at 1200 units/hr 6 heparin level  Daily heparin level, CBC, and monitoring for bleeding F/u plans for anticoagulation    Thank you for allowing pharmacy to participate in this patient's care.  Marja Kays, PharmD Emergency Medicine Clinical Pharmacist 11/27/2022,7:29 AM

## 2022-11-27 NOTE — Progress Notes (Signed)
Discussed with Dr Teressa Lower Troponin elevated 24,000 No chest pain or acute ECG changes Plan to transition back to heparin Sunday and plan right and left cath Monday Avoid beta blocker for low output TTE with EF 15%   Charlton Haws MD [facility]

## 2022-11-27 NOTE — ED Notes (Signed)
Pt returned from CT °

## 2022-11-27 NOTE — ED Provider Notes (Signed)
  Physical Exam  BP (!) 134/108   Pulse 100   Temp 97.9 F (36.6 C) (Oral)   Resp 18   Ht 5\' 1"  (1.549 m)   Wt 73.5 kg   LMP 03/27/2014 (Approximate)   SpO2 99%   BMI 30.61 kg/m   Physical Exam  Procedures  Procedures  ED Course / MDM   Clinical Course as of 11/27/22 0930  Fri Nov 27, 2022  0619 CT ABDOMEN PELVIS W CONTRAST [JM]  [id number] Assumed care from Dr Clayborne Dana. 47 yo F with hx of back pain and is having sharp shooting pain on her L flank. UA here shows some blood. Awaiting CT scan now. If negative will fu and get a spine mri.  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  Off going physician started the patient on heparin drip.  Discussed with hospitalist Dr. Katrinka Blazing for admission.  Olegario Messier from cardiology also consulted for the LV thrombus and they will come evaluate the patient this morning.   [RP]    Clinical Course User Index [JM] Mesner, Barbara Cower, MD [RP] Rondel Baton, MD   Medical Decision Making Amount and/or Complexity of Data Reviewed Labs: ordered. Radiology: ordered. Decision-making details documented in ED Course.  Risk Prescription drug management. Decision regarding hospitalization.     Rondel Baton, MD 11/27/22 0930

## 2022-11-27 NOTE — Progress Notes (Signed)
ANTICOAGULATION CONSULT NOTE  Pharmacy Consult for Eliquis Indication: LV apical thrombus  Allergies  Allergen Reactions   Shellfish Allergy Anaphylaxis and Hives    Patient Measurements: Height: 5\' 1"  (154.9 cm) Weight: 73.5 kg (162 lb) IBW/kg (Calculated) : 47.8 Heparin Dosing Weight: 63.9 kg  Vital Signs: Temp: 98 F (36.7 C) (09/13 1005) Temp Source: Oral (09/13 1005) BP: 128/70 (09/13 1005) Pulse Rate: 102 (09/13 1005)  Labs: Recent Labs    11/26/22 2324  HGB 12.5  HCT 40.5  PLT 303  CREATININE 1.31*    Estimated Creatinine Clearance: 48.2 mL/min (A) (by C-G formula based on SCr of 1.31 mg/dL (H)).   Medical History: Past Medical History:  Diagnosis Date   Acute pulmonary embolism (HCC)    Cardiomyopathy (HCC)    CHF (congestive heart failure) (HCC)    Hypertension    Non-ischemic cardiomyopathy (HCC) 07/2019    Assessment: Alexandria Rhodes is a 47 y.o. year old female admitted on 11/26/2022 with concern for new LV thrombus. No anticoagulation prior to admission. PMH significant for hx of unprovoked PE, HTN and HFrF (EF 30-35% on 07/11/21). Pharmacy initially  consulted to dose heparin, now transitioning to Eliquis.   Plan:  D/C heparin infusion Start Eliquis 5mg  BID Monitoring for bleeding F/u plans for anticoagulation    Thank you for allowing pharmacy to participate in this patient's care.  Ruben Im, PharmD Clinical Pharmacist 11/27/2022 12:01 PM Please check AMION for all [facility] Pharm[facility] numbers

## 2022-11-27 NOTE — ED Provider Notes (Signed)
  Physical Exam  BP (!) 134/108   Pulse 100   Temp 97.9 F (36.6 C) (Oral)   Resp 18   LMP 03/27/2014 (Approximate)   SpO2 99%   Physical Exam  Procedures  Procedures  ED Course / MDM   Clinical Course as of 11/27/22 1647  [facility] Nov 27, 2022  0619 CT ABDOMEN PELVIS W CONTRAST [JM]  1610 Assumed care from Dr Clayborne Dana. 47 yo F with hx of back pain and is having sharp shooting pain on her L flank. UA here shows some blood. Awaiting CT scan now. If negative will fu and get a spine mri.  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  Off going physician started the patient on heparin drip.  Discussed with hospitalist Dr. Katrinka Blazing for admission.  Olegario Messier from cardiology also consulted for the LV thrombus and they will come evaluate the patient this morning.   [RP]    Clinical Course User Index [JM] Mesner, Barbara Cower, MD [RP] Rondel Baton, MD   Medical Decision Making Amount and/or Complexity of Data Reviewed Labs: ordered. Radiology: ordered. Decision-making details documented in ED Course.  Risk Prescription drug management. Decision regarding hospitalization.      Rondel Baton, MD 11/27/22 [phone]

## 2022-11-27 NOTE — Consult Note (Addendum)
CARDIOLOGY CONSULT NOTE       Patient ID: Alexandria Rhodes MRN: [id number] DOB/AGE: 1975-07-19 47 y.o.  Admit date: 11/26/2022 Referring Physician: Eloise Harman ER doc Primary Physician: Lorenda Ishihara, MD Primary Cardiologist: Bensimohn/Turner Reason for Consultation: CHF/Embolus  Principal Problem:   LV (left ventricular) mural thrombus   HPI:  47 y.o. came to ER for sharp left flank pain. 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  EF as low as 10% in 2016 Cath at that time with no CAD CMR in May 2021 with EF 29% Last TTE 07/11/21 EF 30-35% mild LAE mild/mod MR Review shows no mural thrombus. Definity not used. She works at [employer] out [employer]. Cardiac status has been stable. No history of PAF and she is in NSR now Cardiopulmonary stress test 06/24/22 showed normal functional capacity no HF limitations CXR 8/5 showed stable CE no CHF Labs with K 4.4 Cr 1.3 Hct 40.5 PLT 303. Has been on blood thinners before for PE with no issues    ROS All other systems reviewed and negative except as noted above  Past Medical History:  Diagnosis Date   Acute pulmonary embolism (HCC)    Cardiomyopathy (HCC)    CHF (congestive heart failure) (HCC)    Hypertension    Non-ischemic cardiomyopathy (HCC) 07/2019    Family History  Problem Relation Age of Onset   Hypertension Mother     Social History   Socioeconomic History   Marital status: Single    Spouse name: Not on file   Number of children: Not on file   Years of education: Not on file   Highest education level: Not on file  Occupational History   Not on file  Tobacco Use   Smoking status: Former    Types: Cigarettes   Smokeless tobacco: Never  Vaping Use   Vaping status: Never Used  Substance and Sexual Activity   Alcohol use: Not  Currently   Drug use: Not Currently   Sexual activity: Yes  Other Topics Concern   Not on file  Social History Narrative   Not on file   Social Determinants of Health   Financial Resource Strain: High Risk (06/13/2021)   Overall Financial Resource Strain (CARDIA)    Difficulty of Paying Living Expenses: Very hard  Food Insecurity: Food Insecurity Present (02/17/2021)   Hunger Vital Sign    Worried About Running Out of Food in the Last Year: Sometimes true    Ran Out of Food in the Last Year: Sometimes true  Transportation Needs: Unmet Transportation Needs (01/24/2021)   PRAPARE - Administrator, Civil Service (Medical): Yes    Lack of Transportation (Non-Medical): Yes  Physical Activity: Unknown (03/28/2018)   Exercise Vital Sign    Days of Exercise per Week: Patient declined    Minutes of Exercise per Session: Patient declined  Stress: No Stress Concern Present (03/28/2018)   [employer] of Occupational Health - Occupational Stress Questionnaire    Feeling of Stress : Not at all  Social Connections: Unknown (03/28/2018)   Social Connection and Isolation Panel [NHANES]    Frequency of Communication with Friends and Family: Patient declined    Frequency of Social Gatherings with Friends and Family: Patient declined    Attends Religious Services: Patient declined  Active Member of Clubs or Organizations: Patient declined    Attends Banker Meetings: Patient declined    Marital Status: Patient declined  Intimate Partner Violence: Unknown (03/28/2018)   Humiliation, Afraid, Rape, and Kick questionnaire    Fear of Current or Ex-Partner: Patient declined    Emotionally Abused: Patient declined    Physically Abused: Patient declined    Sexually Abused: Patient declined    History reviewed. No pertinent surgical history.    Current Facility-Administered Medications:    acetaminophen (TYLENOL) tablet 650 mg, 650 mg, Oral, Q6H PRN **OR** acetaminophen  (TYLENOL) suppository 650 mg, 650 mg, Rectal, Q6H PRN, Katrinka Blazing, Rondell A, MD   albuterol (PROVENTIL) (2.5 MG/3ML) 0.083% nebulizer solution 2.5 mg, 2.5 mg, Nebulization, Q6H PRN, Katrinka Blazing, Rondell A, MD   gabapentin (NEURONTIN) capsule 100 mg, 100 mg, Oral, Once, Fayrene Helper, PA-C   heparin ADULT infusion 100 units/mL (25000 units/224mL), 1,200 Units/hr, Intravenous, Continuous, Mesner, Barbara Cower, MD, Last Rate: 12 mL/hr at 11/27/22 0745, 1,200 Units/hr at 11/27/22 0745   HYDROcodone-acetaminophen (NORCO/VICODIN) 5-325 MG per tablet 1 tablet, 1 tablet, Oral, Q6H PRN, Katrinka Blazing, Rondell A, MD   ondansetron (ZOFRAN) tablet 4 mg, 4 mg, Oral, Q6H PRN **OR** ondansetron (ZOFRAN) injection 4 mg, 4 mg, Intravenous, Q6H PRN, Smith, Rondell A, MD   sodium chloride flush (NS) 0.9 % injection 3 mL, 3 mL, Intravenous, Q12H, Smith, Rondell A, MD  Current Outpatient Medications:    acetaminophen (TYLENOL) 500 MG tablet, Take 1,000 mg by mouth 2 (two) times daily as needed for mild pain or headache., Disp: , Rfl:    dapagliflozin propanediol (FARXIGA) 10 MG TABS tablet, Take 1 tablet (10 mg total) by mouth daily., Disp: 30 tablet, Rfl: 3   furosemide (LASIX) 40 MG tablet, Take 1 tablet (40 mg total) by mouth daily., Disp: 30 tablet, Rfl: 11   omeprazole (PRILOSEC) 20 MG capsule, Take 1 capsule (20 mg total) by mouth in the morning 30 minutes before morning meal (Patient taking differently: Take 20 mg by mouth daily.), Disp: 30 capsule, Rfl: 4   potassium chloride SA (KLOR-CON M) 20 MEQ tablet, Take 1 tablet (20 mEq total) by mouth daily., Disp: 30 tablet, Rfl: 3   spironolactone (ALDACTONE) 25 MG tablet, Take 1/2 tablet (12.5 mg total) by mouth daily., Disp: 15 tablet, Rfl: 5  gabapentin  100 mg Oral Once   sodium chloride flush  3 mL Intravenous Q12H    heparin 1,200 Units/hr (11/27/22 0745)    Physical Exam: Blood pressure 128/70, pulse (!) 102, temperature 98 F (36.7 C), temperature source Oral, resp. rate (!) 21,  height 5\' 1"  (1.549 m), weight 73.5 kg, last menstrual period 03/27/2014, SpO2 100%.   No distress Lungs clear JVP normal Apical MR murmur PMI increased Mild tenderness over left flank No edema  Labs:   Lab Results  Component Value Date   WBC 7.6 11/26/2022   HGB 12.5 11/26/2022   HCT 40.5 11/26/2022   MCV 89.8 11/26/2022   PLT 303 11/26/2022    Recent Labs  Lab 11/26/22 2324  NA 141  K 4.4  CL 102  CO2 23  BUN 10  CREATININE 1.31*  CALCIUM 9.8  PROT 7.9  BILITOT 1.0  ALKPHOS 73  ALT 21  AST 86*  GLUCOSE 116*   No results found for: "CKTOTAL", "CKMB", "CKMBINDEX", "TROPONINI" No results found for: "CHOL" No results found for: "HDL" No results found for: "LDLCALC" No results found for: "TRIG" No results found for: "CHOLHDL"  No results found for: "LDLDIRECT"    Radiology: CT ABDOMEN PELVIS W CONTRAST  Addendum Date: 11/27/2022   ADDENDUM REPORT: 11/27/2022 07:10 ADDENDUM: Critical Value/emergent results were called by telephone at the time of interpretation on 11/27/2022 at 7:10 am to provider [facility] , who verbally acknowledged these results. Electronically Signed   By: Kennith Center M.D.   On: 11/27/2022 07:10   Result Date: 11/27/2022 CLINICAL DATA:  Abdominal and flank pain.  Kidney stones suspected. EXAM: CT ABDOMEN AND PELVIS WITH CONTRAST TECHNIQUE: Multidetector CT imaging of the abdomen and pelvis was performed using the standard protocol following bolus administration of intravenous contrast. RADIATION DOSE REDUCTION: This exam was performed according to the departmental dose-optimization program which includes automated exposure control, adjustment of the mA and/or kV according to patient size and/or use of iterative reconstruction technique. CONTRAST:  75mL OMNIPAQUE IOHEXOL 350 MG/ML SOLN COMPARISON:  09/02/2022 FINDINGS: Lower chest: Heart is enlarged. There is plaque-like hypodensity along the endocardial surface towards the left ventricular apex (see  axial image 13/3 and coronal 50/6) raising the question of some adherent thrombus in the left ventricle. Hepatobiliary: No suspicious focal abnormality within the liver parenchyma. There is no evidence for gallstones, gallbladder wall thickening, or pericholecystic fluid. No intrahepatic or extrahepatic biliary dilation. Pancreas: No focal mass lesion. Diffuse distention of the main duct again noted measuring up to 4-5 mm, similar to prior. No intraparenchymal cyst. No peripancreatic edema. Spleen: No splenomegaly. 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 Adrenals/Urinary Tract: No adrenal nodule or mass. Right kidney unremarkable. Segmental hypoperfusion identified in the upper interpolar left kidney (axial 25/3) compatible with infarct or pyelonephritis. No evidence for hydroureter. The urinary bladder appears normal for the degree of distention. Stomach/Bowel: Stomach is unremarkable. No gastric wall thickening. No evidence of outlet obstruction. Duodenum is normally positioned as is the ligament of Treitz. No small bowel wall thickening. No small bowel dilatation. The terminal ileum is normal. The appendix is best seen on coronal images and is unremarkable. No gross colonic mass. No colonic wall thickening. Vascular/Lymphatic: No abdominal aortic aneurysm. No abdominal aortic atherosclerotic calcification. There is no gastrohepatic or hepatoduodenal ligament lymphadenopathy. No retroperitoneal or mesenteric lymphadenopathy. No pelvic sidewall lymphadenopathy. Reproductive: There is no adnexal mass. Other: No intraperitoneal free fluid. Musculoskeletal: No worrisome lytic or sclerotic osseous abnormality. IMPRESSION: 1. Segmental hypoperfusion in the upper interpolar left kidney compatible with infarct or pyelonephritis. However given the interval development of a nonacute splenic infarct since 09/02/2022 and findings concerning  for adherent thrombus in the left ventricle, acute to subacute left renal infarct is considered the more likely etiology. 2. 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 3. Cardiomegaly with plaque-like hypodensity along the endocardial surface towards the left ventricular apex raising concern for adherent thrombus in the left ventricle. Cardiology consultation and echocardiogram recommended to further evaluate. Electronically Signed: By: Kennith Center M.D. On: 11/27/2022 07:07    EKG: SR LVH nonspecific ST changes    ASSESSMENT AND PLAN:   Mural apical thrombus:  in patient with long standing non ischemic DCM. Reviewed CT abdomen and we need for definitive study. Have ordered Echo to be done with definity. Start eliquis 5 mg bid. Shown to be non inferior to coumadin. Will need to be on DOAC 3-6 months Suspect f/u cardiac MRI in 3 months will  be needed to document resolution and assess EF. No evidence of PAF or acute MI Check troponin ECG in sinus and no ST elevation Patient with no chest pain. If no thrombus seen on echo would still Rx as clinical suspicion is so high and f/u with cardiac MRI which is more sensitive but not likely to be able to be performed over weekend  CHF:  In past has not been on entresto due to cost/coverage, has not been on coreg due to low BP and bradycardia. Home lasix 40 mg , aldactone 12.5 mg and Farxiga. TTE pending Functional capacity ok on cardiopulmonary stress test ECG not wide QRS so not candidate for CRT Dr DB note indicates discussion on AICD to be had next visit. Since CHF compensated and BP ok can try to add low dose ARB and follow Depending on telemetry and BP can also try to add low dose coreg in 48 hours Check BNP  TTE with definity Start eliquis 5 mg bid Start cozaar with parmeters 25 mg daily Check troponin/BNP Monitor on telemetry for NSVT/PAF   Signed: Charlton Haws 11/27/2022,  10:58 AM

## 2022-11-28 DIAGNOSIS — I420 Dilated cardiomyopathy: Secondary | ICD-10-CM | POA: Diagnosis present

## 2022-11-28 DIAGNOSIS — T45516A Underdosing of anticoagulants, initial encounter: Secondary | ICD-10-CM | POA: Diagnosis present

## 2022-11-28 DIAGNOSIS — Z91148 Patient's other noncompliance with medication regimen for other reason: Secondary | ICD-10-CM | POA: Diagnosis not present

## 2022-11-28 DIAGNOSIS — I361 Nonrheumatic tricuspid (valve) insufficiency: Secondary | ICD-10-CM | POA: Diagnosis not present

## 2022-11-28 DIAGNOSIS — Z5982 Transportation insecurity: Secondary | ICD-10-CM | POA: Diagnosis not present

## 2022-11-28 DIAGNOSIS — I472 Ventricular tachycardia, unspecified: Secondary | ICD-10-CM | POA: Diagnosis present

## 2022-11-28 DIAGNOSIS — I4719 Other supraventricular tachycardia: Secondary | ICD-10-CM | POA: Diagnosis present

## 2022-11-28 DIAGNOSIS — Z79899 Other long term (current) drug therapy: Secondary | ICD-10-CM | POA: Diagnosis not present

## 2022-11-28 DIAGNOSIS — I5022 Chronic systolic (congestive) heart failure: Secondary | ICD-10-CM | POA: Diagnosis not present

## 2022-11-28 DIAGNOSIS — Z5986 Financial insecurity: Secondary | ICD-10-CM | POA: Diagnosis not present

## 2022-11-28 DIAGNOSIS — Z7901 Long term (current) use of anticoagulants: Secondary | ICD-10-CM | POA: Diagnosis not present

## 2022-11-28 DIAGNOSIS — Z8249 Family history of ischemic heart disease and other diseases of the circulatory system: Secondary | ICD-10-CM | POA: Diagnosis not present

## 2022-11-28 DIAGNOSIS — Z87892 Personal history of anaphylaxis: Secondary | ICD-10-CM | POA: Diagnosis not present

## 2022-11-28 DIAGNOSIS — Z87891 Personal history of nicotine dependence: Secondary | ICD-10-CM | POA: Diagnosis not present

## 2022-11-28 DIAGNOSIS — Z86711 Personal history of pulmonary embolism: Secondary | ICD-10-CM | POA: Diagnosis not present

## 2022-11-28 DIAGNOSIS — Z7984 Long term (current) use of oral hypoglycemic drugs: Secondary | ICD-10-CM | POA: Diagnosis not present

## 2022-11-28 DIAGNOSIS — Z91013 Allergy to seafood: Secondary | ICD-10-CM | POA: Diagnosis not present

## 2022-11-28 DIAGNOSIS — I10 Essential (primary) hypertension: Secondary | ICD-10-CM | POA: Diagnosis not present

## 2022-11-28 DIAGNOSIS — D735 Infarction of spleen: Secondary | ICD-10-CM | POA: Diagnosis present

## 2022-11-28 DIAGNOSIS — K219 Gastro-esophageal reflux disease without esophagitis: Secondary | ICD-10-CM | POA: Diagnosis present

## 2022-11-28 DIAGNOSIS — I340 Nonrheumatic mitral (valve) insufficiency: Secondary | ICD-10-CM | POA: Diagnosis not present

## 2022-11-28 DIAGNOSIS — N179 Acute kidney failure, unspecified: Secondary | ICD-10-CM | POA: Diagnosis not present

## 2022-11-28 DIAGNOSIS — R7989 Other specified abnormal findings of blood chemistry: Secondary | ICD-10-CM | POA: Diagnosis not present

## 2022-11-28 DIAGNOSIS — I110 Hypertensive heart disease with heart failure: Secondary | ICD-10-CM | POA: Diagnosis present

## 2022-11-28 DIAGNOSIS — I5043 Acute on chronic combined systolic (congestive) and diastolic (congestive) heart failure: Secondary | ICD-10-CM | POA: Diagnosis not present

## 2022-11-28 DIAGNOSIS — I509 Heart failure, unspecified: Secondary | ICD-10-CM | POA: Diagnosis not present

## 2022-11-28 DIAGNOSIS — N280 Ischemia and infarction of kidney: Secondary | ICD-10-CM | POA: Diagnosis present

## 2022-11-28 DIAGNOSIS — I513 Intracardiac thrombosis, not elsewhere classified: Secondary | ICD-10-CM | POA: Diagnosis present

## 2022-11-28 LAB — COMPREHENSIVE METABOLIC PANEL
ALT: 22 U/L (ref 0–44)
AST: 76 U/L — ABNORMAL HIGH (ref 15–41)
Albumin: 3.3 g/dL — ABNORMAL LOW (ref 3.5–5.0)
Alkaline Phosphatase: 65 U/L (ref 38–126)
Anion gap: 13 (ref 5–15)
BUN: 12 mg/dL (ref 6–20)
CO2: 23 mmol/L (ref 22–32)
Calcium: 9.2 mg/dL (ref 8.9–10.3)
Chloride: 101 mmol/L (ref 98–111)
Creatinine, Ser: 1.09 mg/dL — ABNORMAL HIGH (ref 0.44–1.00)
GFR, Estimated: >60 mL/min (ref >60)
Glucose, Bld: 122 mg/dL — ABNORMAL HIGH (ref 70–99)
Potassium: 3.4 mmol/L — ABNORMAL LOW (ref 3.5–5.1)
Sodium: 137 mmol/L (ref 135–145)
Total Bilirubin: 1 mg/dL (ref 0.3–1.2)
Total Protein: 6.8 g/dL (ref 6.5–8.1)

## 2022-11-28 LAB — CBC
HCT: 36.9 % (ref 36.0–46.0)
Hemoglobin: 11.8 g/dL — ABNORMAL LOW (ref 12.0–15.0)
MCH: 27.9 pg (ref 26.0–34.0)
MCHC: 32 g/dL (ref 30.0–36.0)
MCV: 87.2 fL (ref 80.0–100.0)
Platelets: 276 10*3/uL (ref 150–400)
RBC: 4.23 MIL/uL (ref 3.87–5.11)
RDW: 15.3 % (ref 11.5–15.5)
WBC: 7.8 10*3/uL (ref 4.0–10.5)
nRBC: 0 % (ref 0.0–0.2)

## 2022-11-28 MED ORDER — POTASSIUM CHLORIDE CRYS ER 20 MEQ PO TBCR
40.0000 meq | EXTENDED_RELEASE_TABLET | Freq: Once | ORAL | Status: AC
  Administered 2022-11-28: 40 meq via ORAL
  Filled 2022-11-28: qty 2

## 2022-11-28 MED ORDER — HEPARIN (PORCINE) 25000 UT/250ML-% IV SOLN
1200.0000 [IU]/h | INTRAVENOUS | Status: DC
  Administered 2022-11-28: 1000 [IU]/h via INTRAVENOUS
  Administered 2022-11-29: 1200 [IU]/h via INTRAVENOUS
  Filled 2022-11-28 (×2): qty 250

## 2022-11-28 MED ORDER — SPIRONOLACTONE 12.5 MG HALF TABLET
12.5000 mg | ORAL_TABLET | Freq: Every day | ORAL | Status: DC
  Administered 2022-11-28 – 2022-12-03 (×6): 12.5 mg via ORAL
  Filled 2022-11-28 (×6): qty 1

## 2022-11-28 MED ORDER — ASPIRIN 81 MG PO TBEC
81.0000 mg | DELAYED_RELEASE_TABLET | Freq: Every day | ORAL | Status: DC
  Administered 2022-11-28 – 2022-11-29 (×2): 81 mg via ORAL
  Filled 2022-11-28 (×2): qty 1

## 2022-11-28 NOTE — Progress Notes (Signed)
[facility]  PROGRESS NOTE  Alexandria Rhodes DGU:[id number] DOB: April 05, 1975 DOA: 11/26/2022 PCP: Lorenda Ishihara, MD   Brief HPI:   47 y.o. female with medical history significant of hypertension, HFrEF, history of pulmonary embolism not on anticoagulation, and GERD presents with complaints of left-sided flank pain.  CT scan of the abdomen and pelvis with contrast noted segment hypoperfusion of the left kidney compatible with infarction with interval development of nonacute splenic infarct since 09/02/2022, cardiomegaly with plaque-like hypodensity along the endocardial surface towards the left ventricular apex concerning for adherent thrombus in the left ventricle.  Cardiology was consulted, echocardiogram ordered.  Patient started on apixaban.    Assessment/Plan:    Splenic and renal infarct -Presented with left-sided flank pain -CT abdomen/pelvis showed hypoperfusion of the left kidney compatible with infarction with interval development of known acute splenic infarct since 09/02/2022 -Started on Eliquis  ?  Left mural thrombus -CT scan showed endocardial surface towards the left ventricular apex with plaque-like hypodensity, likely thrombus -2D echo ordered which was negative for thrombus -Cardiology plans to do cardiac cath on Monday due to elevated troponin -Apixaban changed to IV heparin  Troponin elevation -Troponin was elevated to 24,000 -Patient not having any chest pain -EKG showed nonspecific T wave abnormality -Started on heparin, aspirin -Plan for right heart cath/left heart cath on Monday  Chronic systolic CHF Dilated cardiomyopathy Patient does not appear grossly fluid overloaded at this time.  Last echocardiogram noted EF to be 30 to 35% with grade 2 diastolic dysfunction back in 06/2021.  -BNP is elevated 1684 - furosemide and Aldactone are currently on hold    Essential hypertension Blood pressures with diastolic blood pressures elevated up to 108.   Cardiology started low-dose losartan 25 mg. -Monitor   Renal insufficiency Creatinine elevated up to 1.31 with BUN 10.  Urinalysis did not show any signs of infection.  CT head noted a left renal infarct. -Creatinine has improved to 1.09   History of pulmonary embolism Patient with prior pulmonary embolism back in 03/2018.  She reportedly only took Eliquis for 1 month due to cost.    Medications     apixaban  5 mg Oral BID   dapagliflozin propanediol  10 mg Oral Daily   losartan  25 mg Oral Daily   sodium chloride flush  3 mL Intravenous Q12H     Data Reviewed:   CBG:  No results for input(s): "GLUCAP" in the last 168 hours.  SpO2: 97 % O2 Flow Rate (L/min): 0 L/min FiO2 (%): 21 %    Vitals:   11/27/22 1924 11/28/22 0024 11/28/22 0535 11/28/22 0715  BP: 108/84   105/80  Pulse: 100   99  Resp: 20 18 18    Temp: 99.4 F (37.4 C) 97.9 F (36.6 C) 98.5 F (36.9 C) 99.1 F (37.3 C)  TempSrc: Oral Oral Oral Oral  SpO2: 97%   97%  Weight:      Height:          Data Reviewed:  Basic Metabolic Panel: Recent Labs  Lab 11/26/22 2324 11/28/22 0432  NA 141 137  K 4.4 3.4*  CL 102 101  CO2 23 23  GLUCOSE 116* 122*  BUN 10 12  CREATININE 1.31* 1.09*  CALCIUM 9.8 9.2    CBC: Recent Labs  Lab 11/26/22 2324 11/28/22 0432  WBC 7.6 7.8  NEUTROABS 5.5  --   HGB 12.5 11.8*  HCT 40.5 36.9  MCV 89.8 87.2  PLT 303 276  LFT Recent Labs  Lab 11/26/22 2324 11/28/22 0432  AST 86* 76*  ALT 21 22  ALKPHOS 73 65  BILITOT 1.0 1.0  PROT 7.9 6.8  ALBUMIN 4.1 3.3*     Antibiotics: Anti-infectives (From admission, onward)    None        DVT prophylaxis: Apixaban  Code Status: Full code  Family Communication: No family at bedside   CONSULTS    Subjective   Still complains of left flank pain.  Denies any fever or chills.  No nausea vomiting.  No dysuria   Objective    Physical Examination:   General-appears in no acute  distress Heart-S1-S2, regular, no murmur auscultated Lungs-clear to auscultation bilaterally, no wheezing or crackles auscultated Abdomen-soft, left CVA tenderness Extremities-no edema in the lower extremities Neuro-alert, oriented x3, no focal deficit noted  Status is: Inpatient:             Meredeth Ide   [facility] If 7PM-7AM, please contact night-coverage at [email or web address], Office  [phone]   11/28/2022, 8:30 AM  LOS: 0 days

## 2022-11-28 NOTE — Progress Notes (Signed)
Rounding Note    Patient Name: Alexandria Rhodes Date of Encounter: 11/28/2022  [facility] Cardiologist: Bensimhon  Subjective   No complaints  Inpatient Medications    Scheduled Meds:  apixaban  5 mg Oral BID   dapagliflozin propanediol  10 mg Oral Daily   losartan  25 mg Oral Daily   sodium chloride flush  3 mL Intravenous Q12H   Continuous Infusions:  PRN Meds: acetaminophen **OR** acetaminophen, albuterol, HYDROcodone-acetaminophen, ondansetron **OR** ondansetron (ZOFRAN) IV   Vital Signs    Vitals:   11/27/22 1924 11/28/22 0024 11/28/22 0535 11/28/22 0715  BP: 108/84   105/80  Pulse: 100   99  Resp: 20 18 18    Temp: 99.4 F (37.4 C) 97.9 F (36.6 C) 98.5 F (36.9 C) 99.1 F (37.3 C)  TempSrc: Oral Oral Oral Oral  SpO2: 97%   97%  Weight:      Height:        Intake/Output Summary (Last 24 hours) at 11/28/2022 1000 Last data filed at 11/27/2022 1900 Gross per 24 hour  Intake 339.03 ml  Output --  Net 339.03 ml      11/27/2022    7:00 AM 10/19/2022    2:17 PM 09/02/2022    3:38 PM  Last 3 Weights  Weight (lbs) 162 lb 160 lb 15 oz 161 lb  Weight (kg) 73.483 kg 73 kg 73.029 kg      Telemetry    NSR and mild sinus tach - Personally Reviewed  ECG    N/a - Personally Reviewed  Physical Exam   GEN: No acute distress.   Neck: No JVD Cardiac: RRR, no murmurs, rubs, or gallops.  Respiratory: Clear to auscultation bilaterally. GI: Soft, nontender, non-distended  MS: No edema; No deformity. Neuro:  Nonfocal  Psych: Normal affect   Labs    High Sensitivity Troponin:   Recent Labs  Lab 11/27/22 1529  TROPONINIHS >24,000*     Chemistry Recent Labs  Lab 11/26/22 2324 11/28/22 0432  NA 141 137  K 4.4 3.4*  CL 102 101  CO2 23 23  GLUCOSE 116* 122*  BUN 10 12  CREATININE 1.31* 1.09*  CALCIUM 9.8 9.2  PROT 7.9 6.8  ALBUMIN 4.1 3.3*  AST 86* 76*  ALT 21 22  ALKPHOS 73 65  BILITOT 1.0 1.0  GFRNONAA 50* >60  ANIONGAP 16* 13     Lipids No results for input(s): "CHOL", "TRIG", "HDL", "LABVLDL", "LDLCALC", "CHOLHDL" in the last 168 hours.  Hematology Recent Labs  Lab 11/26/22 2324 11/28/22 0432  WBC 7.6 7.8  RBC 4.51 4.23  HGB 12.5 11.8*  HCT 40.5 36.9  MCV 89.8 87.2  MCH 27.7 27.9  MCHC 30.9 32.0  RDW 14.8 15.3  PLT 303 276   Thyroid No results for input(s): "TSH", "FREET4" in the last 168 hours.  BNP Recent Labs  Lab 11/27/22 1529  BNP 1,684.1*    DDimer No results for input(s): "DDIMER" in the last 168 hours.   Radiology    ECHOCARDIOGRAM COMPLETE  Result Date: 11/27/2022    ECHOCARDIOGRAM REPORT   Patient Name:   Alexandria Rhodes Date of Exam: 11/27/2022 Medical Rec #:  [id number]      Height:       61.0 in Accession #:    [id number]     Weight:       162.0 lb Date of Birth:  1975/04/14       BSA:  1.727 m Patient Age:    47 years       BP:           128/70 mmHg Patient Gender: F              HR:           81 bpm. Exam Location:  Inpatient Procedure: 2D Echo, Color Doppler, Cardiac Doppler and Intracardiac            Opacification Agent Indications:    I42.9 Cardiomyopathy (unspecified)  History:        Patient has prior history of Echocardiogram examinations, most                 recent 07/11/2021. CHF; Risk Factors:Hypertension.  Sonographer:    Irving Burton Senior RDCS Referring Phys: [id number] Wendall Stade IMPRESSIONS  1. Left ventricular ejection fraction, by estimation, is 15%. The left ventricle has severely decreased function. The left ventricle demonstrates global hypokinesis. The left ventricular internal cavity size was severely dilated. Left ventricular diastolic parameters are indeterminate.  2. Right ventricular systolic function is mildly reduced. The right ventricular size is normal. Tricuspid regurgitation signal is inadequate for assessing PA pressure.  3. Left atrial size was severely dilated.  4. The mitral valve is grossly normal. Moderate mitral valve regurgitation. No evidence of mitral  stenosis.  5. The aortic valve is tricuspid. Aortic valve regurgitation is trivial. No aortic stenosis is present.  6. The inferior vena cava is dilated in size with <50% respiratory variability, suggesting right atrial pressure of 15 mmHg. Conclusion(s)/Recommendation(s): No left ventricular mural or apical thrombus/thrombi. FINDINGS  Left Ventricle: Left ventricular ejection fraction, by estimation, is 15%. The left ventricle has severely decreased function. The left ventricle demonstrates global hypokinesis. Definity contrast agent was given IV to delineate the left ventricular endocardial borders. The left ventricular internal cavity size was severely dilated. There is no left ventricular hypertrophy. Left ventricular diastolic parameters are indeterminate. Right Ventricle: The right ventricular size is normal. No increase in right ventricular wall thickness. Right ventricular systolic function is mildly reduced. Tricuspid regurgitation signal is inadequate for assessing PA pressure. Left Atrium: Left atrial size was severely dilated. Right Atrium: Right atrial size was normal in size. Pericardium: Trivial pericardial effusion is present. Mitral Valve: The mitral valve is grossly normal. Moderate mitral valve regurgitation. No evidence of mitral valve stenosis. Tricuspid Valve: The tricuspid valve is normal in structure. Tricuspid valve regurgitation is mild . No evidence of tricuspid stenosis. Aortic Valve: The aortic valve is tricuspid. Aortic valve regurgitation is trivial. No aortic stenosis is present. Pulmonic Valve: The pulmonic valve was normal in structure. Pulmonic valve regurgitation is mild. No evidence of pulmonic stenosis. Aorta: The aortic root and ascending aorta are structurally normal, with no evidence of dilitation. Venous: The inferior vena cava is dilated in size with less than 50% respiratory variability, suggesting right atrial pressure of 15 mmHg. IAS/Shunts: No atrial level shunt  detected by color flow Doppler.  LEFT VENTRICLE PLAX 2D LVIDd:         7.00 cm LVIDs:         6.60 cm LV PW:         0.80 cm LV IVS:        0.50 cm LVOT diam:     1.90 cm LV SV:         29 LV SV Index:   17 LVOT Area:     2.84 cm  RIGHT VENTRICLE RV S  prime:     10.40 cm/s TAPSE (M-mode): 1.2 cm LEFT ATRIUM              Index        RIGHT ATRIUM           Index LA diam:        4.40 cm  2.55 cm/m   RA Area:     16.10 cm LA Vol (A2C):   124.0 ml 71.80 ml/m  RA Volume:   42.20 ml  24.44 ml/m LA Vol (A4C):   113.0 ml 65.43 ml/m LA Biplane Vol: 122.0 ml 70.64 ml/m  AORTIC VALVE LVOT Vmax:   83.60 cm/s LVOT Vmean:  60.200 cm/s LVOT VTI:    0.101 m  AORTA Ao Root diam: 2.80 cm Ao Asc diam:  3.50 cm MITRAL VALVE MV Area (PHT): 3.27 cm       SHUNTS MV Decel Time: 232 msec       Systemic VTI:  0.10 m MR Peak grad:    55.7 mmHg    Systemic Diam: 1.90 cm MR Mean grad:    40.0 mmHg MR Vmax:         373.00 cm/s MR Vmean:        305.0 cm/s MR PISA:         3.08 cm MR PISA Eff ROA: 32 mm MR PISA Radius:  0.70 cm MV E velocity: 113.00 cm/s Weston Brass MD Electronically signed by Weston Brass MD Signature Date/Time: 11/27/2022/12:17:11 PM    Final    CT ABDOMEN PELVIS W CONTRAST  Addendum Date: 11/27/2022   ADDENDUM REPORT: 11/27/2022 07:10 ADDENDUM: Critical Value/emergent results were called by telephone at the time of interpretation on 11/27/2022 at 7:10 am to provider [facility] , who verbally acknowledged these results. Electronically Signed   By: Kennith Center M.D.   On: 11/27/2022 07:10   Result Date: 11/27/2022 CLINICAL DATA:  Abdominal and flank pain.  Kidney stones suspected. EXAM: CT ABDOMEN AND PELVIS WITH CONTRAST TECHNIQUE: Multidetector CT imaging of the abdomen and pelvis was performed using the standard protocol following bolus administration of intravenous contrast. RADIATION DOSE REDUCTION: This exam was performed according to the departmental dose-optimization program which includes  automated exposure control, adjustment of the mA and/or kV according to patient size and/or use of iterative reconstruction technique. CONTRAST:  75mL OMNIPAQUE IOHEXOL 350 MG/ML SOLN COMPARISON:  09/02/2022 FINDINGS: Lower chest: Heart is enlarged. There is plaque-like hypodensity along the endocardial surface towards the left ventricular apex (see axial image 13/3 and coronal 50/6) raising the question of some adherent thrombus in the left ventricle. Hepatobiliary: No suspicious focal abnormality within the liver parenchyma. There is no evidence for gallstones, gallbladder wall thickening, or pericholecystic fluid. No intrahepatic or extrahepatic biliary dilation. Pancreas: No focal mass lesion. Diffuse distention of the main duct again noted measuring up to 4-5 mm, similar to prior. No intraparenchymal cyst. No peripancreatic edema. Spleen: No splenomegaly. 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 Adrenals/Urinary Tract: No adrenal nodule or mass. Right kidney unremarkable. Segmental hypoperfusion identified in the upper interpolar left kidney (axial 25/3) compatible with infarct or pyelonephritis. No evidence for hydroureter. The urinary bladder appears normal for the degree of distention. Stomach/Bowel: Stomach is unremarkable. No gastric wall thickening. No evidence of outlet obstruction. Duodenum is normally positioned as is the ligament of Treitz. No small bowel wall thickening. No small bowel dilatation. The  terminal ileum is normal. The appendix is best seen on coronal images and is unremarkable. No gross colonic mass. No colonic wall thickening. Vascular/Lymphatic: No abdominal aortic aneurysm. No abdominal aortic atherosclerotic calcification. There is no gastrohepatic or hepatoduodenal ligament lymphadenopathy. No retroperitoneal or mesenteric lymphadenopathy. No pelvic sidewall lymphadenopathy. Reproductive: There is no  adnexal mass. Other: No intraperitoneal free fluid. Musculoskeletal: No worrisome lytic or sclerotic osseous abnormality. IMPRESSION: 1. Segmental hypoperfusion in the upper interpolar left kidney compatible with infarct or pyelonephritis. 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 2. 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 3. Cardiomegaly with plaque-like hypodensity along the endocardial surface towards the left ventricular apex raising concern for adherent thrombus in the left ventricle. Cardiology consultation and echocardiogram recommended to further evaluate. Electronically Signed: By: Kennith Center M.D. On: 11/27/2022 07:07    Cardiac Studies     Patient Profile     47 y.o. female history of HTN, prior PE, HFrEF, presented with left sided flank pain.   Assessment & Plan    1.Elevated troponin - >24000 trop. EKG chronic inferior/precordial lateral ST/T changes. She has had not chest pain or SOB  - 06/2021 echo: LVEF 30-35%, grade II dd, mild RV dysfunction, mild to mod MR -11/2022 echo: LVEF 15%, indet diastolic, mild RV dysfunction, mod MR  - presented with flank pain, evidence of splenic and renal infarct with suspect LV thrombus by CT but no clear thrombus by echo - from notes cath 2016 in [place] without significant disease  - plans for RHC/LHC Monday. If no significant CAD may have embolized to coronaries as well as her kidneys and spleen - we will stop eliquis and start hep gtt in anticipation for cath Monday. While on hep add ASA 81mg  daily. Hold her farxiga in plans for NPO and cath.    2. Chronic HFrEF 07/2019 cMRI: LVEF 29%, nonspecific LGE, some evidence of trabeculations - 06/2021 echo: LVEF 30-35%, grade II dd,  mild RV dysfunction, mild to mod MR -11/2022 echo: LVEF 15%, indet diastolic, mild RV dysfunction, mod MR  -historically medical therapy limited by costs (entresto). Has not been on coreg due to low HRs and bp's. Has been on aldactone, farxiga at home - losartan 25 mg daily added, bp's are ok and renal function stable.  - add back her aldactone 12.5mg   3. Flank pain - Urinalysis noted small hemoglobin but no significant signs for infection. CT scan of the abdomen and pelvis with contrast noted segment hypoperfusion of the left kidney compatible with infarction with interval development of nonacute splenic infarct since 12/03/2022. cardiomegaly with plaque-like hypodensity along the endocardial surface towards the left ventricular apex concerning for adherent thrombus in the left ventricle.  - no clear thrombus on echo - would agree with plan per Dr Eden Emms that clinical suspicion very high for LV thrombus and would continue anticoag. May warrante cMRI, particularly if normal cath to look for both LV thrombus and possibly myocarditis.  For questions or updates, please contact [facility] Please consult [email or web address] for contact info under        Signed, Dina Rich, MD  11/28/2022, 10:00 AM

## 2022-11-28 NOTE — Plan of Care (Signed)

## 2022-11-28 NOTE — Progress Notes (Addendum)
ANTICOAGULATION CONSULT NOTE - Initial Consult  Pharmacy Consult for heparin Indication: chest pain/ACS and potential LV thrombus  Allergies  Allergen Reactions   Shellfish Allergy Anaphylaxis and Hives    Patient Measurements: Height: 5\' 1"  (154.9 cm) Weight: 73.5 kg (162 lb) IBW/kg (Calculated) : 47.8 Heparin Dosing Weight: 63.9 kg   Vital Signs: Temp: 99.1 F (37.3 C) (09/14 0715) Temp Source: Oral (09/14 0715) BP: 105/80 (09/14 0715) Pulse Rate: 99 (09/14 0715)  Labs: Recent Labs    11/26/22 2324 11/27/22 1529 11/28/22 0432  HGB 12.5  --  11.8*  HCT 40.5  --  36.9  PLT 303  --  276  CREATININE 1.31*  --  1.09*  TROPONINIHS  --  >24,000*  --     Estimated Creatinine Clearance: 57.9 mL/min (A) (by C-G formula based on SCr of 1.09 mg/dL (H)).   Medical History: Past Medical History:  Diagnosis Date   Acute pulmonary embolism (HCC)    Cardiomyopathy (HCC)    CHF (congestive heart failure) (HCC)    Hypertension    Non-ischemic cardiomyopathy (HCC) 07/2019    Medications:  Scheduled:   aspirin EC  81 mg Oral Daily   losartan  25 mg Oral Daily   potassium chloride  40 mEq Oral Once   sodium chloride flush  3 mL Intravenous Q12H   spironolactone  12.5 mg Oral Daily   Infusions:   Assessment: 47 yo female who presented with L sided flank pain. Trop > 24,000. Was on eliquis for 1 month s/p PE back in 2020 but did not continue due to cost. Was started on eliquis inpt with questionable LV thrombus, last dose 9/14 1000. Plan for RHC/LHC on Monday. CBC stable, no signs of bleeding noted.  Pharmacy consulted to dose heparin in setting of ACS/STEMI.  Goal of Therapy:  Heparin level 0.3-0.7 units/ml aPTT 66-102 seconds Monitor platelets by anticoagulation protocol: Yes   Plan:  Start heparin 1000 units/hr at 2200 (12 hours post eliquis dose)  6 hour heparin level and aPTT until levels correlate Monitor CBC and for s/sx of bleeding  F/u cath plans   Alexandria Rhodes  Alexandria Rhodes 11/28/2022,10:20 AM

## 2022-11-29 DIAGNOSIS — R7989 Other specified abnormal findings of blood chemistry: Secondary | ICD-10-CM

## 2022-11-29 DIAGNOSIS — I10 Essential (primary) hypertension: Secondary | ICD-10-CM | POA: Diagnosis not present

## 2022-11-29 DIAGNOSIS — N280 Ischemia and infarction of kidney: Secondary | ICD-10-CM | POA: Diagnosis not present

## 2022-11-29 DIAGNOSIS — I513 Intracardiac thrombosis, not elsewhere classified: Secondary | ICD-10-CM | POA: Diagnosis not present

## 2022-11-29 DIAGNOSIS — I5022 Chronic systolic (congestive) heart failure: Secondary | ICD-10-CM | POA: Diagnosis not present

## 2022-11-29 LAB — BASIC METABOLIC PANEL
Anion gap: 10 (ref 5–15)
BUN: 13 mg/dL (ref 6–20)
CO2: 24 mmol/L (ref 22–32)
Calcium: 9.1 mg/dL (ref 8.9–10.3)
Chloride: 102 mmol/L (ref 98–111)
Creatinine, Ser: 0.87 mg/dL (ref 0.44–1.00)
GFR, Estimated: >60 mL/min (ref >60)
Glucose, Bld: 102 mg/dL — ABNORMAL HIGH (ref 70–99)
Potassium: 3.8 mmol/L (ref 3.5–5.1)
Sodium: 136 mmol/L (ref 135–145)

## 2022-11-29 LAB — APTT
aPTT: 38 s — ABNORMAL HIGH (ref 24–36)
aPTT: 75 seconds — ABNORMAL HIGH (ref 24–36)

## 2022-11-29 LAB — CBC
HCT: 35.4 % — ABNORMAL LOW (ref 36.0–46.0)
Hemoglobin: 11 g/dL — ABNORMAL LOW (ref 12.0–15.0)
MCH: 27.8 pg (ref 26.0–34.0)
MCHC: 31.1 g/dL (ref 30.0–36.0)
MCV: 89.4 fL (ref 80.0–100.0)
Platelets: 249 10*3/uL (ref 150–400)
RBC: 3.96 MIL/uL (ref 3.87–5.11)
RDW: 15.4 % (ref 11.5–15.5)
WBC: 6.4 10*3/uL (ref 4.0–10.5)
nRBC: 0 % (ref 0.0–0.2)

## 2022-11-29 LAB — HEPARIN LEVEL (UNFRACTIONATED)
Heparin Unfractionated: 0.8 [IU]/mL — ABNORMAL HIGH (ref 0.30–0.70)
Heparin Unfractionated: 0.74 [IU]/mL — ABNORMAL HIGH (ref 0.30–0.70)

## 2022-11-29 MED ORDER — ASPIRIN 81 MG PO CHEW
81.0000 mg | CHEWABLE_TABLET | ORAL | Status: AC
  Administered 2022-11-30: 81 mg via ORAL
  Filled 2022-11-29: qty 1

## 2022-11-29 MED ORDER — SODIUM CHLORIDE 0.9 % IV SOLN: INTRAVENOUS | Status: DC

## 2022-11-29 NOTE — H&P (View-Only) (Signed)
Rounding Note    Patient Name: Alexandria Rhodes Date of Encounter: 11/29/2022  Outpatient Services [facility] Cardiologist: Bensimhon  Subjective   No complaints  Inpatient Medications    Scheduled Meds:  aspirin EC  81 mg Oral Daily   losartan  25 mg Oral Daily   sodium chloride flush  3 mL Intravenous Q12H   spironolactone  12.5 mg Oral Daily   Continuous Infusions:  heparin 1,200 Units/hr (11/29/22 0741)   PRN Meds: acetaminophen **OR** acetaminophen, albuterol, HYDROcodone-acetaminophen, ondansetron **OR** ondansetron (ZOFRAN) IV   Vital Signs    Vitals:   11/28/22 0715 11/28/22 1650 11/28/22 1933 11/29/22 0347  BP: 105/80  90/66 97/72  Pulse: 99   91  Resp:   18 20  Temp: 99.1 F (37.3 C) 98.4 F (36.9 C) 98.4 F (36.9 C) 98.2 F (36.8 C)  TempSrc: Oral Oral Oral Oral  SpO2: 97%  100% 98%  Weight:      Height:        Intake/Output Summary (Last 24 hours) at 11/29/2022 0817 Last data filed at 11/29/2022 0500 Gross per 24 hour  Intake 65.59 ml  Output --  Net 65.59 ml      11/27/2022    7:00 AM 10/19/2022    2:17 PM 09/02/2022    3:38 PM  Last 3 Weights  Weight (lbs) 162 lb 160 lb 15 oz 161 lb  Weight (kg) 73.483 kg 73 kg 73.029 kg      Telemetry    NSR - Personally Reviewed  ECG    N/a - Personally Reviewed  Physical Exam   GEN: No acute distress.   Neck: No JVD Cardiac: RRR, no murmurs, rubs, or gallops.  Respiratory: Clear to auscultation bilaterally. GI: Soft, nontender, non-distended  MS: No edema; No deformity. Neuro:  Nonfocal  Psych: Normal affect   Labs    High Sensitivity Troponin:   Recent Labs  Lab 11/27/22 1529  TROPONINIHS >24,000*     Chemistry Recent Labs  Lab 11/26/22 2324 11/28/22 0432 11/29/22 0601  NA 141 137 136  K 4.4 3.4* 3.8  CL 102 101 102  CO2 23 23 24   GLUCOSE 116* 122* 102*  BUN 10 12 13   CREATININE 1.31* 1.09* 0.87  CALCIUM 9.8 9.2 9.1  PROT 7.9 6.8  --   ALBUMIN 4.1 3.3*  --   AST 86* 76*  --    ALT 21 22  --   ALKPHOS 73 65  --   BILITOT 1.0 1.0  --   GFRNONAA 50* >60 >60  ANIONGAP 16* 13 10    Lipids No results for input(s): "CHOL", "TRIG", "HDL", "LABVLDL", "LDLCALC", "CHOLHDL" in the last 168 hours.  Hematology Recent Labs  Lab 11/26/22 2324 11/28/22 0432 11/29/22 0601  WBC 7.6 7.8 6.4  RBC 4.51 4.23 3.96  HGB 12.5 11.8* 11.0*  HCT 40.5 36.9 35.4*  MCV 89.8 87.2 89.4  MCH 27.7 27.9 27.8  MCHC 30.9 32.0 31.1  RDW 14.8 15.3 15.4  PLT 303 276 249   Thyroid No results for input(s): "TSH", "FREET4" in the last 168 hours.  BNP Recent Labs  Lab 11/27/22 1529  BNP 1,684.1*    DDimer No results for input(s): "DDIMER" in the last 168 hours.   Radiology    ECHOCARDIOGRAM COMPLETE  Result Date: 11/27/2022    ECHOCARDIOGRAM REPORT   Patient Name:   Alexandria Rhodes Date of Exam: 11/27/2022 Medical Rec #:  [id number]      Height:  61.0 in Accession #:    [id number]     Weight:       162.0 lb Date of Birth:  Oct 12, 1975       BSA:          1.727 m Patient Age:    47 years       BP:           128/70 mmHg Patient Gender: F              HR:           81 bpm. Exam Location:  Inpatient Procedure: 2D Echo, Color Doppler, Cardiac Doppler and Intracardiac            Opacification Agent Indications:    I42.9 Cardiomyopathy (unspecified)  History:        Patient has prior history of Echocardiogram examinations, most                 recent 07/11/2021. CHF; Risk Factors:Hypertension.  Sonographer:    Irving Burton Senior RDCS Referring Phys: [id number] Wendall Stade IMPRESSIONS  1. Left ventricular ejection fraction, by estimation, is 15%. The left ventricle has severely decreased function. The left ventricle demonstrates global hypokinesis. The left ventricular internal cavity size was severely dilated. Left ventricular diastolic parameters are indeterminate.  2. Right ventricular systolic function is mildly reduced. The right ventricular size is normal. Tricuspid regurgitation signal is inadequate for  assessing PA pressure.  3. Left atrial size was severely dilated.  4. The mitral valve is grossly normal. Moderate mitral valve regurgitation. No evidence of mitral stenosis.  5. The aortic valve is tricuspid. Aortic valve regurgitation is trivial. No aortic stenosis is present.  6. The inferior vena cava is dilated in size with <50% respiratory variability, suggesting right atrial pressure of 15 mmHg. Conclusion(s)/Recommendation(s): No left ventricular mural or apical thrombus/thrombi. FINDINGS  Left Ventricle: Left ventricular ejection fraction, by estimation, is 15%. The left ventricle has severely decreased function. The left ventricle demonstrates global hypokinesis. Definity contrast agent was given IV to delineate the left ventricular endocardial borders. The left ventricular internal cavity size was severely dilated. There is no left ventricular hypertrophy. Left ventricular diastolic parameters are indeterminate. Right Ventricle: The right ventricular size is normal. No increase in right ventricular wall thickness. Right ventricular systolic function is mildly reduced. Tricuspid regurgitation signal is inadequate for assessing PA pressure. Left Atrium: Left atrial size was severely dilated. Right Atrium: Right atrial size was normal in size. Pericardium: Trivial pericardial effusion is present. Mitral Valve: The mitral valve is grossly normal. Moderate mitral valve regurgitation. No evidence of mitral valve stenosis. Tricuspid Valve: The tricuspid valve is normal in structure. Tricuspid valve regurgitation is mild . No evidence of tricuspid stenosis. Aortic Valve: The aortic valve is tricuspid. Aortic valve regurgitation is trivial. No aortic stenosis is present. Pulmonic Valve: The pulmonic valve was normal in structure. Pulmonic valve regurgitation is mild. No evidence of pulmonic stenosis. Aorta: The aortic root and ascending aorta are structurally normal, with no evidence of dilitation. Venous: The  inferior vena cava is dilated in size with less than 50% respiratory variability, suggesting right atrial pressure of 15 mmHg. IAS/Shunts: No atrial level shunt detected by color flow Doppler.  LEFT VENTRICLE PLAX 2D LVIDd:         7.00 cm LVIDs:         6.60 cm LV PW:         0.80 cm LV IVS:  0.50 cm LVOT diam:     1.90 cm LV SV:         29 LV SV Index:   17 LVOT Area:     2.84 cm  RIGHT VENTRICLE RV S prime:     10.40 cm/s TAPSE (M-mode): 1.2 cm LEFT ATRIUM              Index        RIGHT ATRIUM           Index LA diam:        4.40 cm  2.55 cm/m   RA Area:     16.10 cm LA Vol (A2C):   124.0 ml 71.80 ml/m  RA Volume:   42.20 ml  24.44 ml/m LA Vol (A4C):   113.0 ml 65.43 ml/m LA Biplane Vol: 122.0 ml 70.64 ml/m  AORTIC VALVE LVOT Vmax:   83.60 cm/s LVOT Vmean:  60.200 cm/s LVOT VTI:    0.101 m  AORTA Ao Root diam: 2.80 cm Ao Asc diam:  3.50 cm MITRAL VALVE MV Area (PHT): 3.27 cm       SHUNTS MV Decel Time: 232 msec       Systemic VTI:  0.10 m MR Peak grad:    55.7 mmHg    Systemic Diam: 1.90 cm MR Mean grad:    40.0 mmHg MR Vmax:         373.00 cm/s MR Vmean:        305.0 cm/s MR PISA:         3.08 cm MR PISA Eff ROA: 32 mm MR PISA Radius:  0.70 cm MV E velocity: 113.00 cm/s Weston Brass MD Electronically signed by Weston Brass MD Signature Date/Time: 11/27/2022/12:17:11 PM    Final     Cardiac Studies     Patient Profile     47 y.o. female history of HTN, prior PE, HFrEF, presented with left sided flank pain.   Assessment & Plan    1.Elevated troponin - >24000 trop. EKG chronic inferior/precordial lateral ST/T changes. She has not had any chest pain or SOB. Presented with flank pain.   - 06/2021 echo: LVEF 30-35%, grade II dd, mild RV dysfunction, mild to mod MR -11/2022 echo: LVEF 15%, indet diastolic, mild RV dysfunction, mod MR   - presented with flank pain, evidence of splenic and renal infarct with suspect LV thrombus by CT but no clear thrombus by echo - from notes cath  2016 in [place] without significant disease   - Dr Eden Emms had discussed with Dr Gala Romney, plans for RHC/LHC Monday.  - If no significant CAD may have embolized to coronaries as well as her kidneys and spleen - stopped eliquis and started hep gtt in anticipation for cath Monday. While on hep add ASA 81mg  daily. Hold her farxiga in plans for NPO and cath.  - if benign cath consider cMRI evaluatate possible myocarditis, also better evaluate if LV thrombus is present  Informed Consent   Shared Decision Making/Informed Consent The risks [stroke (1 in 1000), death (1 in 1000), kidney failure [usually temporary] (1 in 500), bleeding (1 in 200), allergic reaction [possibly serious] (1 in 200)], benefits (diagnostic support and management of coronary artery disease) and alternatives of a cardiac catheterization were discussed in detail with Ms. Scheetz and she is willing to proceed.       2. Chronic HFrEF 07/2019 cMRI: LVEF 29%, nonspecific LGE, some evidence of trabeculations - 06/2021 echo: LVEF 30-35%, grade II dd, mild  RV dysfunction, mild to mod MR -11/2022 echo: LVEF 15%, indet diastolic, mild RV dysfunction, mod MR   -historically medical therapy limited by costs (entresto). Has not been on coreg due to low HRs and bp's. Has been on aldactone, farxiga at home - losartan 25 mg and aldactone 12.5mg  daily added, renal function stable. Bp's soft but acceptable.    3. Flank pain - Urinalysis noted small hemoglobin but no significant signs for infection. CT scan of the abdomen and pelvis with contrast noted segment hypoperfusion of the left kidney compatible with infarction with interval development of nonacute splenic infarct since 12/03/2022. CT suggested cardiomegaly with plaque-like hypodensity along the endocardial surface towards the left ventricular apex concerning for adherent thrombus in the left ventricle.   - no clear thrombus on echo - would agree with plan per Dr Eden Emms that clinical suspicion  very high for LV thrombus and would continue anticoag. May warrant cMRI, particularly if normal cath to look for both LV thrombus and possibly myocarditis.    For questions or updates, please contact [facility] Please consult [email or web address] for contact info under        Signed, Dina Rich, MD  11/29/2022, 8:17 AM

## 2022-11-29 NOTE — Progress Notes (Signed)
ANTICOAGULATION CONSULT NOTE  Pharmacy Consult for heparin Indication: chest pain/ACS and potential LV thrombus  Allergies  Allergen Reactions   Shellfish Allergy Anaphylaxis and Hives    Patient Measurements: Height: 5\' 1"  (154.9 cm) Weight: 73.5 kg (162 lb) IBW/kg (Calculated) : 47.8 Heparin Dosing Weight: 63.9 kg   Vital Signs: Temp: 98.2 F (36.8 C) (09/15 0347) Temp Source: Oral (09/15 0347) BP: 97/72 (09/15 0347) Pulse Rate: 91 (09/15 0347)  Labs: Recent Labs    11/26/22 2324 11/27/22 1529 11/28/22 0432 11/29/22 0601 11/29/22 1413  HGB 12.5  --  11.8* 11.0*  --   HCT 40.5  --  36.9 35.4*  --   PLT 303  --  276 249  --   APTT  --   --   --  38* 75*  HEPARINUNFRC  --   --   --  0.80* 0.74*  CREATININE 1.31*  --  1.09* 0.87  --   TROPONINIHS  --  >24,000*  --   --   --     Estimated Creatinine Clearance: 72.5 mL/min (by C-G formula based on SCr of 0.87 mg/dL).   Medical History: Past Medical History:  Diagnosis Date   Acute pulmonary embolism (HCC)    Cardiomyopathy (HCC)    CHF (congestive heart failure) (HCC)    Hypertension    Non-ischemic cardiomyopathy (HCC) 07/2019    Medications:  Scheduled:   aspirin EC  81 mg Oral Daily   losartan  25 mg Oral Daily   sodium chloride flush  3 mL Intravenous Q12H   spironolactone  12.5 mg Oral Daily   Infusions:   heparin 1,200 Units/hr (11/29/22 0741)    Assessment: 47 yo female who presented with L sided flank pain. Trop > 24,000. Was on eliquis for 1 month s/p PE back in 2020 but did not continue due to cost. Was started on eliquis inpt with questionable LV thrombus, last dose 9/14 1000. Plan for RHC/LHC on Monday.  Pharmacy consulted to dose heparin in setting of ACS/LV thrombus  CBC stable, no signs of bleeding per RN.  Heparin level 0.74, aPTT therapeutic at 75 on 1200 units/hr. Heparin level elevated in setting of recent DOAC use. Will dose based on aPTT until levels correlate  Goal of Therapy:   Heparin level 0.3-0.7 units/ml aPTT 66-102 seconds Monitor platelets by anticoagulation protocol: Yes   Plan:  Continue heparin at 1200 units/hr  Daily aPTT and Heparin level Monitor CBC and for s/sx of bleeding  F/u cath plans   Ruben Im, PharmD Clinical Pharmacist 11/29/2022 3:23 PM Please check AMION for all [facility] Pharmacy numbers

## 2022-11-29 NOTE — Progress Notes (Signed)
Rounding Note    Patient Name: Alexandria Rhodes Date of Encounter: 11/29/2022  Outpatient Services [facility] Cardiologist: Bensimhon  Subjective   No complaints  Inpatient Medications    Scheduled Meds:  aspirin EC  81 mg Oral Daily   losartan  25 mg Oral Daily   sodium chloride flush  3 mL Intravenous Q12H   spironolactone  12.5 mg Oral Daily   Continuous Infusions:  heparin 1,200 Units/hr (11/29/22 0741)   PRN Meds: acetaminophen **OR** acetaminophen, albuterol, HYDROcodone-acetaminophen, ondansetron **OR** ondansetron (ZOFRAN) IV   Vital Signs    Vitals:   11/28/22 0715 11/28/22 1650 11/28/22 1933 11/29/22 0347  BP: 105/80  90/66 97/72  Pulse: 99   91  Resp:   18 20  Temp: 99.1 F (37.3 C) 98.4 F (36.9 C) 98.4 F (36.9 C) 98.2 F (36.8 C)  TempSrc: Oral Oral Oral Oral  SpO2: 97%  100% 98%  Weight:      Height:        Intake/Output Summary (Last 24 hours) at 11/29/2022 0817 Last data filed at 11/29/2022 0500 Gross per 24 hour  Intake 65.59 ml  Output --  Net 65.59 ml      11/27/2022    7:00 AM 10/19/2022    2:17 PM 09/02/2022    3:38 PM  Last 3 Weights  Weight (lbs) 162 lb 160 lb 15 oz 161 lb  Weight (kg) 73.483 kg 73 kg 73.029 kg      Telemetry    NSR - Personally Reviewed  ECG    N/a - Personally Reviewed  Physical Exam   GEN: No acute distress.   Neck: No JVD Cardiac: RRR, no murmurs, rubs, or gallops.  Respiratory: Clear to auscultation bilaterally. GI: Soft, nontender, non-distended  MS: No edema; No deformity. Neuro:  Nonfocal  Psych: Normal affect   Labs    High Sensitivity Troponin:   Recent Labs  Lab 11/27/22 1529  TROPONINIHS >24,000*     Chemistry Recent Labs  Lab 11/26/22 2324 11/28/22 0432 11/29/22 0601  NA 141 137 136  K 4.4 3.4* 3.8  CL 102 101 102  CO2 23 23 24   GLUCOSE 116* 122* 102*  BUN 10 12 13   CREATININE 1.31* 1.09* 0.87  CALCIUM 9.8 9.2 9.1  PROT 7.9 6.8  --   ALBUMIN 4.1 3.3*  --   AST 86* 76*  --    ALT 21 22  --   ALKPHOS 73 65  --   BILITOT 1.0 1.0  --   GFRNONAA 50* >60 >60  ANIONGAP 16* 13 10    Lipids No results for input(s): "CHOL", "TRIG", "HDL", "LABVLDL", "LDLCALC", "CHOLHDL" in the last 168 hours.  Hematology Recent Labs  Lab 11/26/22 2324 11/28/22 0432 11/29/22 0601  WBC 7.6 7.8 6.4  RBC 4.51 4.23 3.96  HGB 12.5 11.8* 11.0*  HCT 40.5 36.9 35.4*  MCV 89.8 87.2 89.4  MCH 27.7 27.9 27.8  MCHC 30.9 32.0 31.1  RDW 14.8 15.3 15.4  PLT 303 276 249   Thyroid No results for input(s): "TSH", "FREET4" in the last 168 hours.  BNP Recent Labs  Lab 11/27/22 1529  BNP 1,684.1*    DDimer No results for input(s): "DDIMER" in the last 168 hours.   Radiology    ECHOCARDIOGRAM COMPLETE  Result Date: 11/27/2022    ECHOCARDIOGRAM REPORT   Patient Name:   SARINAH DUXBURY Date of Exam: 11/27/2022 Medical Rec #:  [id number]      Height:  61.0 in Accession #:    [id number]     Weight:       162.0 lb Date of Birth:  Oct 12, 1975       BSA:          1.727 m Patient Age:    47 years       BP:           128/70 mmHg Patient Gender: F              HR:           81 bpm. Exam Location:  Inpatient Procedure: 2D Echo, Color Doppler, Cardiac Doppler and Intracardiac            Opacification Agent Indications:    I42.9 Cardiomyopathy (unspecified)  History:        Patient has prior history of Echocardiogram examinations, most                 recent 07/11/2021. CHF; Risk Factors:Hypertension.  Sonographer:    Irving Burton Senior RDCS Referring Phys: [id number] Wendall Stade IMPRESSIONS  1. Left ventricular ejection fraction, by estimation, is 15%. The left ventricle has severely decreased function. The left ventricle demonstrates global hypokinesis. The left ventricular internal cavity size was severely dilated. Left ventricular diastolic parameters are indeterminate.  2. Right ventricular systolic function is mildly reduced. The right ventricular size is normal. Tricuspid regurgitation signal is inadequate for  assessing PA pressure.  3. Left atrial size was severely dilated.  4. The mitral valve is grossly normal. Moderate mitral valve regurgitation. No evidence of mitral stenosis.  5. The aortic valve is tricuspid. Aortic valve regurgitation is trivial. No aortic stenosis is present.  6. The inferior vena cava is dilated in size with <50% respiratory variability, suggesting right atrial pressure of 15 mmHg. Conclusion(s)/Recommendation(s): No left ventricular mural or apical thrombus/thrombi. FINDINGS  Left Ventricle: Left ventricular ejection fraction, by estimation, is 15%. The left ventricle has severely decreased function. The left ventricle demonstrates global hypokinesis. Definity contrast agent was given IV to delineate the left ventricular endocardial borders. The left ventricular internal cavity size was severely dilated. There is no left ventricular hypertrophy. Left ventricular diastolic parameters are indeterminate. Right Ventricle: The right ventricular size is normal. No increase in right ventricular wall thickness. Right ventricular systolic function is mildly reduced. Tricuspid regurgitation signal is inadequate for assessing PA pressure. Left Atrium: Left atrial size was severely dilated. Right Atrium: Right atrial size was normal in size. Pericardium: Trivial pericardial effusion is present. Mitral Valve: The mitral valve is grossly normal. Moderate mitral valve regurgitation. No evidence of mitral valve stenosis. Tricuspid Valve: The tricuspid valve is normal in structure. Tricuspid valve regurgitation is mild . No evidence of tricuspid stenosis. Aortic Valve: The aortic valve is tricuspid. Aortic valve regurgitation is trivial. No aortic stenosis is present. Pulmonic Valve: The pulmonic valve was normal in structure. Pulmonic valve regurgitation is mild. No evidence of pulmonic stenosis. Aorta: The aortic root and ascending aorta are structurally normal, with no evidence of dilitation. Venous: The  inferior vena cava is dilated in size with less than 50% respiratory variability, suggesting right atrial pressure of 15 mmHg. IAS/Shunts: No atrial level shunt detected by color flow Doppler.  LEFT VENTRICLE PLAX 2D LVIDd:         7.00 cm LVIDs:         6.60 cm LV PW:         0.80 cm LV IVS:  0.50 cm LVOT diam:     1.90 cm LV SV:         29 LV SV Index:   17 LVOT Area:     2.84 cm  RIGHT VENTRICLE RV S prime:     10.40 cm/s TAPSE (M-mode): 1.2 cm LEFT ATRIUM              Index        RIGHT ATRIUM           Index LA diam:        4.40 cm  2.55 cm/m   RA Area:     16.10 cm LA Vol (A2C):   124.0 ml 71.80 ml/m  RA Volume:   42.20 ml  24.44 ml/m LA Vol (A4C):   113.0 ml 65.43 ml/m LA Biplane Vol: 122.0 ml 70.64 ml/m  AORTIC VALVE LVOT Vmax:   83.60 cm/s LVOT Vmean:  60.200 cm/s LVOT VTI:    0.101 m  AORTA Ao Root diam: 2.80 cm Ao Asc diam:  3.50 cm MITRAL VALVE MV Area (PHT): 3.27 cm       SHUNTS MV Decel Time: 232 msec       Systemic VTI:  0.10 m MR Peak grad:    55.7 mmHg    Systemic Diam: 1.90 cm MR Mean grad:    40.0 mmHg MR Vmax:         373.00 cm/s MR Vmean:        305.0 cm/s MR PISA:         3.08 cm MR PISA Eff ROA: 32 mm MR PISA Radius:  0.70 cm MV E velocity: 113.00 cm/s Weston Brass MD Electronically signed by Weston Brass MD Signature Date/Time: 11/27/2022/12:17:11 PM    Final     Cardiac Studies     Patient Profile     47 y.o. female history of HTN, prior PE, HFrEF, presented with left sided flank pain.   Assessment & Plan    1.Elevated troponin - >24000 trop. EKG chronic inferior/precordial lateral ST/T changes. She has not had any chest pain or SOB. Presented with flank pain.   - 06/2021 echo: LVEF 30-35%, grade II dd, mild RV dysfunction, mild to mod MR -11/2022 echo: LVEF 15%, indet diastolic, mild RV dysfunction, mod MR   - presented with flank pain, evidence of splenic and renal infarct with suspect LV thrombus by CT but no clear thrombus by echo - from notes cath  2016 in [place] without significant disease   - Dr Eden Emms had discussed with Dr Gala Romney, plans for RHC/LHC Monday.  - If no significant CAD may have embolized to coronaries as well as her kidneys and spleen - stopped eliquis and started hep gtt in anticipation for cath Monday. While on hep add ASA 81mg  daily. Hold her farxiga in plans for NPO and cath.  - if benign cath consider cMRI evaluatate possible myocarditis, also better evaluate if LV thrombus is present  Informed Consent   Shared Decision Making/Informed Consent The risks [stroke (1 in 1000), death (1 in 1000), kidney failure [usually temporary] (1 in 500), bleeding (1 in 200), allergic reaction [possibly serious] (1 in 200)], benefits (diagnostic support and management of coronary artery disease) and alternatives of a cardiac catheterization were discussed in detail with Ms. Scheetz and she is willing to proceed.       2. Chronic HFrEF 07/2019 cMRI: LVEF 29%, nonspecific LGE, some evidence of trabeculations - 06/2021 echo: LVEF 30-35%, grade II dd, mild  RV dysfunction, mild to mod MR -11/2022 echo: LVEF 15%, indet diastolic, mild RV dysfunction, mod MR   -historically medical therapy limited by costs (entresto). Has not been on coreg due to low HRs and bp's. Has been on aldactone, farxiga at home - losartan 25 mg and aldactone 12.5mg  daily added, renal function stable. Bp's soft but acceptable.    3. Flank pain - Urinalysis noted small hemoglobin but no significant signs for infection. CT scan of the abdomen and pelvis with contrast noted segment hypoperfusion of the left kidney compatible with infarction with interval development of nonacute splenic infarct since 12/03/2022. CT suggested cardiomegaly with plaque-like hypodensity along the endocardial surface towards the left ventricular apex concerning for adherent thrombus in the left ventricle.   - no clear thrombus on echo - would agree with plan per Dr Eden Emms that clinical suspicion  very high for LV thrombus and would continue anticoag. May warrant cMRI, particularly if normal cath to look for both LV thrombus and possibly myocarditis.    For questions or updates, please contact [facility] Please consult [email or web address] for contact info under        Signed, Dina Rich, MD  11/29/2022, 8:17 AM

## 2022-11-29 NOTE — Progress Notes (Signed)
[facility]  PROGRESS NOTE  Alexandria Rhodes ZOX:[id number] DOB: 1975/07/16 DOA: 11/26/2022 PCP: Lorenda Ishihara, MD   Brief HPI:   47 y.o. female with medical history significant of hypertension, HFrEF, history of pulmonary embolism not on anticoagulation, and GERD presents with complaints of left-sided flank pain.  CT scan of the abdomen and pelvis with contrast noted segment hypoperfusion of the left kidney compatible with infarction with interval development of nonacute splenic infarct since 09/02/2022, cardiomegaly with plaque-like hypodensity along the endocardial surface towards the left ventricular apex concerning for adherent thrombus in the left ventricle.  Cardiology was consulted, echocardiogram ordered.  Patient started on apixaban.    Assessment/Plan:    Splenic and renal infarct -Presented with left-sided flank pain -No fever or dysuria -CT abdomen/pelvis showed hypoperfusion of the left kidney compatible with infarction with interval development of known acute splenic infarct since 09/02/2022 -Started on Eliquis; switched to IV heparin for RHC/LHC in a.m.  ?  Left mural thrombus -CT scan showed endocardial surface towards the left ventricular apex with plaque-like hypodensity, likely thrombus -2D echo ordered which was negative for thrombus -Cardiology plans to do cardiac cath on Monday due to elevated troponin -Apixaban changed to IV heparin  Troponin elevation -Troponin was elevated to 24,000 -Patient not having any chest pain -EKG showed nonspecific T wave abnormality -Started on heparin, aspirin -Plan for right heart cath/left heart cath on Monday  Chronic systolic CHF Dilated cardiomyopathy Patient does not appear grossly fluid overloaded at this time.  Last echocardiogram noted EF to be 30 to 35% with grade 2 diastolic dysfunction back in 06/2021.  -BNP is elevated 1684 - furosemide and Aldactone are currently on hold    Essential  hypertension Blood pressures with diastolic blood pressures elevated up to 108.  Cardiology started low-dose losartan 25 mg. -Monitor   Renal insufficiency Creatinine elevated up to 1.31 with BUN 10.  Urinalysis did not show any signs of infection.  CT head noted a left renal infarct. -Creatinine has improved to 0.87   History of pulmonary embolism Patient with prior pulmonary embolism back in 03/2018.  She reportedly only took Eliquis for 1 month due to cost.    Medications     aspirin EC  81 mg Oral Daily   losartan  25 mg Oral Daily   sodium chloride flush  3 mL Intravenous Q12H   spironolactone  12.5 mg Oral Daily     Data Reviewed:   CBG:  No results for input(s): "GLUCAP" in the last 168 hours.  SpO2: 98 % O2 Flow Rate (L/min): 0 L/min FiO2 (%): 21 %    Vitals:   11/28/22 0715 11/28/22 1650 11/28/22 1933 11/29/22 0347  BP: 105/80  90/66 97/72  Pulse: 99   91  Resp:   18 20  Temp: 99.1 F (37.3 C) 98.4 F (36.9 C) 98.4 F (36.9 C) 98.2 F (36.8 C)  TempSrc: Oral Oral Oral Oral  SpO2: 97%  100% 98%  Weight:      Height:          Data Reviewed:  Basic Metabolic Panel: Recent Labs  Lab 11/26/22 2324 11/28/22 0432 11/29/22 0601  NA 141 137 136  K 4.4 3.4* 3.8  CL 102 101 102  CO2 23 23 24   GLUCOSE 116* 122* 102*  BUN 10 12 13   CREATININE 1.31* 1.09* 0.87  CALCIUM 9.8 9.2 9.1    CBC: Recent Labs  Lab 11/26/22 2324 11/28/22 0432 11/29/22 0601  WBC 7.6 7.8  6.4  NEUTROABS 5.5  --   --   HGB 12.5 11.8* 11.0*  HCT 40.5 36.9 35.4*  MCV 89.8 87.2 89.4  PLT 303 276 249    LFT Recent Labs  Lab 11/26/22 2324 11/28/22 0432  AST 86* 76*  ALT 21 22  ALKPHOS 73 65  BILITOT 1.0 1.0  PROT 7.9 6.8  ALBUMIN 4.1 3.3*     Antibiotics: Anti-infectives (From admission, onward)    None        DVT prophylaxis: Apixaban  Code Status: Full code  Family Communication: No family at bedside   CONSULTS    Subjective   Denies chest  pain or shortness of breath.     Objective    Physical Examination:  General-appears in no acute distress Heart-S1-S2, regular, no murmur auscultated Lungs-clear to auscultation bilaterally Abdomen-soft, positive left CVA tenderness Extremities-no edema in the lower extremities Neuro-alert, oriented x3, no focal deficit noted  Status is: Inpatient:        Meredeth Ide   [facility] If 7PM-7AM, please contact night-coverage at [email or web address], Office  [phone]   11/29/2022, 8:46 AM  LOS: 1 day

## 2022-11-29 NOTE — Progress Notes (Signed)
ANTICOAGULATION CONSULT NOTE - Initial Consult  Pharmacy Consult for heparin Indication: chest pain/ACS and potential LV thrombus  Allergies  Allergen Reactions   Shellfish Allergy Anaphylaxis and Hives    Patient Measurements: Height: 5\' 1"  (154.9 cm) Weight: 73.5 kg (162 lb) IBW/kg (Calculated) : 47.8 Heparin Dosing Weight: 63.9 kg   Vital Signs: Temp: 98.2 F (36.8 C) (09/15 0347) Temp Source: Oral (09/15 0347) BP: 97/72 (09/15 0347) Pulse Rate: 91 (09/15 0347)  Labs: Recent Labs    11/26/22 2324 11/27/22 1529 11/28/22 0432 11/29/22 0601  HGB 12.5  --  11.8* 11.0*  HCT 40.5  --  36.9 35.4*  PLT 303  --  276 249  APTT  --   --   --  38*  HEPARINUNFRC  --   --   --  0.80*  CREATININE 1.31*  --  1.09* 0.87  TROPONINIHS  --  >24,000*  --   --     Estimated Creatinine Clearance: 72.5 mL/min (by C-G formula based on SCr of 0.87 mg/dL).   Medical History: Past Medical History:  Diagnosis Date   Acute pulmonary embolism (HCC)    Cardiomyopathy (HCC)    CHF (congestive heart failure) (HCC)    Hypertension    Non-ischemic cardiomyopathy (HCC) 07/2019    Medications:  Scheduled:   aspirin EC  81 mg Oral Daily   losartan  25 mg Oral Daily   sodium chloride flush  3 mL Intravenous Q12H   spironolactone  12.5 mg Oral Daily   Infusions:   heparin 1,000 Units/hr (11/28/22 2206)    Assessment: 47 yo female who presented with L sided flank pain. Trop > 24,000. Was on eliquis for 1 month s/p PE back in 2020 but did not continue due to cost. Was started on eliquis inpt with questionable LV thrombus, last dose 9/14 1000. Plan for RHC/LHC on Monday.  Pharmacy consulted to dose heparin in setting of ACS/LV thrombus  CBC stable, no signs of bleeding per RN.  HL 0.8, aPTT subtherapeutic at 38 on 1000 units/hr. HL likely elevated in setting of DOAC use. Will dose based on aPTT.   Goal of Therapy:  Heparin level 0.3-0.7 units/ml aPTT 66-102 seconds Monitor platelets by  anticoagulation protocol: Yes   Plan:  Increase heparin to 1200 units/hr  6 hour aPTT and HL Monitor CBC and for s/sx of bleeding  F/u cath plans   Renad Jenniges I Leavy Heatherly 11/29/2022,7:00 AM

## 2022-11-30 ENCOUNTER — Other Ambulatory Visit (HOSPITAL_COMMUNITY): Payer: Self-pay

## 2022-11-30 ENCOUNTER — Telehealth (HOSPITAL_COMMUNITY): Payer: Self-pay | Admitting: Pharmacy Technician

## 2022-11-30 ENCOUNTER — Encounter (HOSPITAL_COMMUNITY)
Admission: EM | Disposition: A | Discharge status: Home / Self Care | Payer: Self-pay | Source: Home / Self Care | Attending: Family Medicine

## 2022-11-30 DIAGNOSIS — I5022 Chronic systolic (congestive) heart failure: Secondary | ICD-10-CM | POA: Diagnosis not present

## 2022-11-30 DIAGNOSIS — I513 Intracardiac thrombosis, not elsewhere classified: Secondary | ICD-10-CM | POA: Diagnosis not present

## 2022-11-30 DIAGNOSIS — I519 Heart disease, unspecified: Secondary | ICD-10-CM

## 2022-11-30 DIAGNOSIS — I10 Essential (primary) hypertension: Secondary | ICD-10-CM | POA: Diagnosis not present

## 2022-11-30 DIAGNOSIS — N280 Ischemia and infarction of kidney: Secondary | ICD-10-CM | POA: Diagnosis not present

## 2022-11-30 DIAGNOSIS — I509 Heart failure, unspecified: Secondary | ICD-10-CM

## 2022-11-30 HISTORY — PX: RIGHT/LEFT HEART CATH AND CORONARY ANGIOGRAPHY: CATH118266

## 2022-11-30 LAB — CBC
HCT: 34.4 % — ABNORMAL LOW (ref 36.0–46.0)
Hemoglobin: 11.1 g/dL — ABNORMAL LOW (ref 12.0–15.0)
MCH: 28.8 pg (ref 26.0–34.0)
MCHC: 32.3 g/dL (ref 30.0–36.0)
MCV: 89.1 fL (ref 80.0–100.0)
Platelets: 258 10*3/uL (ref 150–400)
RBC: 3.86 MIL/uL — ABNORMAL LOW (ref 3.87–5.11)
RDW: 15.5 % (ref 11.5–15.5)
WBC: 6 10*3/uL (ref 4.0–10.5)
nRBC: 0 % (ref 0.0–0.2)

## 2022-11-30 LAB — POCT I-STAT 7, (LYTES, BLD GAS, ICA,H+H)
Acid-base deficit: 2 mmol/L (ref 0.0–2.0)
Bicarbonate: 22 mmol/L (ref 20.0–28.0)
Calcium, Ion: 1.2 mmol/L (ref 1.15–1.40)
HCT: 33 % — ABNORMAL LOW (ref 36.0–46.0)
Hemoglobin: 11.2 g/dL — ABNORMAL LOW (ref 12.0–15.0)
O2 Saturation: 96 %
Potassium: 3.8 mmol/L (ref 3.5–5.1)
Sodium: 139 mmol/L (ref 135–145)
TCO2: 23 mmol/L (ref 22–32)
pCO2 arterial: 35.7 mmHg (ref 32–48)
pH, Arterial: 7.397 (ref 7.35–7.45)
pO2, Arterial: 80 mmHg — ABNORMAL LOW (ref 83–108)

## 2022-11-30 LAB — POCT I-STAT EG7
Acid-base deficit: 1 mmol/L (ref 0.0–2.0)
Acid-base deficit: 3 mmol/L — ABNORMAL HIGH (ref 0.0–2.0)
Bicarbonate: 21.9 mmol/L (ref 20.0–28.0)
Bicarbonate: 23.8 mmol/L (ref 20.0–28.0)
Calcium, Ion: 1.19 mmol/L (ref 1.15–1.40)
Calcium, Ion: 1.23 mmol/L (ref 1.15–1.40)
HCT: 34 % — ABNORMAL LOW (ref 36.0–46.0)
HCT: 34 % — ABNORMAL LOW (ref 36.0–46.0)
Hemoglobin: 11.6 g/dL — ABNORMAL LOW (ref 12.0–15.0)
Hemoglobin: 11.6 g/dL — ABNORMAL LOW (ref 12.0–15.0)
O2 Saturation: 62 %
O2 Saturation: 62 %
Potassium: 3.7 mmol/L (ref 3.5–5.1)
Potassium: 3.8 mmol/L (ref 3.5–5.1)
Sodium: 133 mmol/L — ABNORMAL LOW (ref 135–145)
Sodium: 137 mmol/L (ref 135–145)
TCO2: 23 mmol/L (ref 22–32)
TCO2: 25 mmol/L (ref 22–32)
pCO2, Ven: 38.8 mmHg — ABNORMAL LOW (ref 44–60)
pCO2, Ven: 39.4 mmHg — ABNORMAL LOW (ref 44–60)
pH, Ven: 7.36 (ref 7.25–7.43)
pH, Ven: 7.389 (ref 7.25–7.43)
pO2, Ven: 32 mmHg (ref 32–45)
pO2, Ven: 33 mmHg (ref 32–45)

## 2022-11-30 LAB — BASIC METABOLIC PANEL
Anion gap: 9 (ref 5–15)
BUN: 17 mg/dL (ref 6–20)
CO2: 22 mmol/L (ref 22–32)
Calcium: 8.9 mg/dL (ref 8.9–10.3)
Chloride: 104 mmol/L (ref 98–111)
Creatinine, Ser: 0.95 mg/dL (ref 0.44–1.00)
GFR, Estimated: >60 mL/min (ref >60)
Glucose, Bld: 102 mg/dL — ABNORMAL HIGH (ref 70–99)
Potassium: 3.9 mmol/L (ref 3.5–5.1)
Sodium: 135 mmol/L (ref 135–145)

## 2022-11-30 LAB — TROPONIN I (HIGH SENSITIVITY)
Troponin I (High Sensitivity): 3235 ng/L (ref <18)
Troponin I (High Sensitivity): 2157 ng/L (ref <18)

## 2022-11-30 LAB — HEPARIN LEVEL (UNFRACTIONATED): Heparin Unfractionated: 0.61 [IU]/mL (ref 0.30–0.70)

## 2022-11-30 LAB — APTT: aPTT: 90 s — ABNORMAL HIGH (ref 24–36)

## 2022-11-30 SURGERY — RIGHT/LEFT HEART CATH AND CORONARY ANGIOGRAPHY
Anesthesia: LOCAL

## 2022-11-30 MED ORDER — MORPHINE SULFATE (PF) 2 MG/ML IV SOLN: 2.0000 mg | INTRAVENOUS | Status: DC | PRN

## 2022-11-30 MED ORDER — NITROGLYCERIN 1 MG/10 ML FOR IR/CATH LAB
INTRA_ARTERIAL | Status: AC
  Filled 2022-11-30: qty 10

## 2022-11-30 MED ORDER — SODIUM CHLORIDE 0.9 % IV SOLN: 250.0000 mL | INTRAVENOUS | Status: DC | PRN

## 2022-11-30 MED ORDER — LIDOCAINE HCL (PF) 1 % IJ SOLN
INTRAMUSCULAR | Status: AC
  Filled 2022-11-30: qty 30

## 2022-11-30 MED ORDER — LABETALOL HCL 5 MG/ML IV SOLN: 10.0000 mg | INTRAVENOUS | Status: AC | PRN

## 2022-11-30 MED ORDER — LIDOCAINE HCL (PF) 1 % IJ SOLN
INTRAMUSCULAR | Status: DC | PRN
  Administered 2022-11-30: 1 mL
  Administered 2022-11-30: 2 mL

## 2022-11-30 MED ORDER — SODIUM CHLORIDE 0.9% FLUSH
3.0000 mL | Freq: Two times a day (BID) | INTRAVENOUS | Status: DC
  Administered 2022-11-30 – 2022-12-03 (×6): 3 mL via INTRAVENOUS

## 2022-11-30 MED ORDER — HYDRALAZINE HCL 20 MG/ML IJ SOLN: 10.0000 mg | INTRAMUSCULAR | Status: AC | PRN

## 2022-11-30 MED ORDER — SODIUM CHLORIDE 0.9% FLUSH: 3.0000 mL | INTRAVENOUS | Status: DC | PRN

## 2022-11-30 MED ORDER — IOHEXOL 350 MG/ML SOLN
INTRAVENOUS | Status: DC | PRN
  Administered 2022-11-30: 28 mL

## 2022-11-30 MED ORDER — APIXABAN 5 MG PO TABS
5.0000 mg | ORAL_TABLET | Freq: Two times a day (BID) | ORAL | Status: DC
  Administered 2022-11-30 – 2022-12-03 (×6): 5 mg via ORAL
  Filled 2022-11-30 (×6): qty 1

## 2022-11-30 MED ORDER — HEPARIN (PORCINE) 25000 UT/250ML-% IV SOLN: 1200.0000 [IU]/h | INTRAVENOUS | Status: DC

## 2022-11-30 MED ORDER — ONDANSETRON HCL 4 MG/2ML IJ SOLN: 4.0000 mg | Freq: Four times a day (QID) | INTRAMUSCULAR | Status: DC | PRN

## 2022-11-30 MED ORDER — ACETAMINOPHEN 325 MG PO TABS: 650.0000 mg | ORAL_TABLET | ORAL | Status: DC | PRN

## 2022-11-30 MED ORDER — HEPARIN SODIUM (PORCINE) 1000 UNIT/ML IJ SOLN
INTRAMUSCULAR | Status: AC
  Filled 2022-11-30: qty 10

## 2022-11-30 MED ORDER — VERAPAMIL HCL 2.5 MG/ML IV SOLN
INTRAVENOUS | Status: AC
  Filled 2022-11-30: qty 2

## 2022-11-30 MED ORDER — METHYLPREDNISOLONE SODIUM SUCC 125 MG IJ SOLR
INTRAMUSCULAR | Status: AC
  Filled 2022-11-30: qty 2

## 2022-11-30 MED ORDER — DIPHENHYDRAMINE HCL 50 MG/ML IJ SOLN
INTRAMUSCULAR | Status: DC | PRN
  Administered 2022-11-30: 25 mg via INTRAVENOUS

## 2022-11-30 MED ORDER — DIPHENHYDRAMINE HCL 50 MG/ML IJ SOLN
INTRAMUSCULAR | Status: AC
  Filled 2022-11-30: qty 1

## 2022-11-30 MED ORDER — VERAPAMIL HCL 2.5 MG/ML IV SOLN
INTRA_ARTERIAL | Status: DC | PRN
  Administered 2022-11-30: 10 mL via INTRA_ARTERIAL

## 2022-11-30 MED ORDER — SODIUM CHLORIDE 0.9 % IV SOLN: INTRAVENOUS | Status: AC

## 2022-11-30 MED ORDER — HEPARIN (PORCINE) IN NACL 1000-0.9 UT/500ML-% IV SOLN
INTRAVENOUS | Status: DC | PRN
  Administered 2022-11-30 (×2): 500 mL

## 2022-11-30 MED ORDER — HEPARIN SODIUM (PORCINE) 1000 UNIT/ML IJ SOLN
INTRAMUSCULAR | Status: DC | PRN
  Administered 2022-11-30: 3500 [IU] via INTRAVENOUS

## 2022-11-30 MED ORDER — METHYLPREDNISOLONE SODIUM SUCC 125 MG IJ SOLR
INTRAMUSCULAR | Status: DC | PRN
  Administered 2022-11-30: 125 mg via INTRAVENOUS

## 2022-11-30 SURGICAL SUPPLY — 13 items
CATH BALLN WEDGE 5F 110CM (CATHETERS) IMPLANT
CATH INFINITI AMBI 5FR TG (CATHETERS) IMPLANT
DEVICE RAD TR BAND REGULAR (VASCULAR PRODUCTS) IMPLANT
GLIDESHEATH SLEND A-KIT 6F 22G (SHEATH) IMPLANT
GLIDESHEATH SLEND SS 6F .021 (SHEATH) IMPLANT
GUIDEWIRE INQWIRE 1.5J.035X260 (WIRE) IMPLANT
INQWIRE 1.5J .035X260CM (WIRE) ×1
KIT SINGLE USE MANIFOLD (KITS) IMPLANT
PACK CARDIAC CATHETERIZATION (CUSTOM PROCEDURE TRAY) ×1 IMPLANT
SET ATX-X65L (MISCELLANEOUS) IMPLANT
SHEATH GLIDE SLENDER 4/5FR (SHEATH) IMPLANT
SHEATH PROBE COVER 6X72 (BAG) IMPLANT
WIRE HI TORQ VERSACORE-J 145CM (WIRE) IMPLANT

## 2022-11-30 NOTE — Progress Notes (Addendum)
Rounding Note    Patient Name: Alexandria Rhodes Date of Encounter: 11/30/2022  [facility] Cardiologist: Arvilla Meres, MD   Subjective   No complaints today except back pain (same that brought her in) - DOE with mild ST with movement - does not appear volume up on exam.  Inpatient Medications    Scheduled Meds:  aspirin EC  81 mg Oral Daily   losartan  25 mg Oral Daily   sodium chloride flush  3 mL Intravenous Q12H   spironolactone  12.5 mg Oral Daily   Continuous Infusions:  sodium chloride 10 mL/hr at 11/30/22 0456   heparin 1,200 Units/hr (11/29/22 2117)   PRN Meds: acetaminophen **OR** acetaminophen, albuterol, HYDROcodone-acetaminophen, ondansetron **OR** ondansetron (ZOFRAN) IV   Vital Signs    Vitals:   11/29/22 2348 11/30/22 0459 11/30/22 0505 11/30/22 0751  BP: 99/81 95/78    Pulse: 88 94    Resp: 18 16  17   Temp: 98.2 F (36.8 C) 97.9 F (36.6 C)  98.2 F (36.8 C)  TempSrc: Oral Oral  Oral  SpO2: 99% 100%    Weight:   76.1 kg   Height:        Intake/Output Summary (Last 24 hours) at 11/30/2022 0834 Last data filed at 11/30/2022 0300 Gross per 24 hour  Intake 652.29 ml  Output --  Net 652.29 ml      11/30/2022    5:05 AM 11/27/2022    7:00 AM 10/19/2022    2:17 PM  Last 3 Weights  Weight (lbs) 167 lb 11.2 oz 162 lb 160 lb 15 oz  Weight (kg) 76.068 kg 73.483 kg 73 kg      Telemetry    Sinus to sinus tachycardia with HR 80-100s - Personally Reviewed  ECG    No new tracings - Personally Reviewed  Physical Exam   GEN: No acute distress.   Neck: No JVD Cardiac: RRR, no murmurs, rubs, or gallops.  Respiratory: Clear to auscultation bilaterally. GI: Soft, nontender, non-distended  MS: No edema; No deformity. Neuro:  Nonfocal  Psych: Normal affect   Labs    High Sensitivity Troponin:   Recent Labs  Lab 11/27/22 1529  TROPONINIHS >24,000*     Chemistry Recent Labs  Lab 11/26/22 2324 11/28/22 0432 11/29/22 0601  11/30/22 0412  NA 141 137 136 135  K 4.4 3.4* 3.8 3.9  CL 102 101 102 104  CO2 23 23 24 22   GLUCOSE 116* 122* 102* 102*  BUN 10 12 13 17   CREATININE 1.31* 1.09* 0.87 0.95  CALCIUM 9.8 9.2 9.1 8.9  PROT 7.9 6.8  --   --   ALBUMIN 4.1 3.3*  --   --   AST 86* 76*  --   --   ALT 21 22  --   --   ALKPHOS 73 65  --   --   BILITOT 1.0 1.0  --   --   GFRNONAA 50* >60 >60 >60  ANIONGAP 16* 13 10 9     Lipids No results for input(s): "CHOL", "TRIG", "HDL", "LABVLDL", "LDLCALC", "CHOLHDL" in the last 168 hours.  Hematology Recent Labs  Lab 11/28/22 0432 11/29/22 0601 11/30/22 0412  WBC 7.8 6.4 6.0  RBC 4.23 3.96 3.86*  HGB 11.8* 11.0* 11.1*  HCT 36.9 35.4* 34.4*  MCV 87.2 89.4 89.1  MCH 27.9 27.8 28.8  MCHC 32.0 31.1 32.3  RDW 15.3 15.4 15.5  PLT 276 249 258   Thyroid No results for input(s): "  TSH", "FREET4" in the last 168 hours.  BNP Recent Labs  Lab 11/27/22 1529  BNP 1,684.1*    DDimer No results for input(s): "DDIMER" in the last 168 hours.   Radiology    No results found.  Cardiac Studies   Right and left heart cath today   Echo 11/27/22: 1. Left ventricular ejection fraction, by estimation, is 15%. The left  ventricle has severely decreased function. The left ventricle demonstrates  global hypokinesis. The left ventricular internal cavity size was severely  dilated. Left ventricular  diastolic parameters are indeterminate.   2. Right ventricular systolic function is mildly reduced. The right  ventricular size is normal. Tricuspid regurgitation signal is inadequate  for assessing PA pressure.   3. Left atrial size was severely dilated.   4. The mitral valve is grossly normal. Moderate mitral valve  regurgitation. No evidence of mitral stenosis.   5. The aortic valve is tricuspid. Aortic valve regurgitation is trivial.  No aortic stenosis is present.   6. The inferior vena cava is dilated in size with <50% respiratory  variability, suggesting right atrial  pressure of 15 mmHg.   Patient Profile     47 y.o. 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   Friday evening 11/27/22, hs troponin trended to >24k with no EKG changes or chest pain. Eliquis was transitioned to heparin gtt with plans for right and left heart catheterization today. Suspected embolic event to a coronary artery.  Hx of medication noncompliance due to cost.  Assessment & Plan    Acute on chronic systolic and diastolic heart failure Nonischemic cardiomyopathy - hx of EF as low as 10% in 2016 improved to 40-45%, lifevest discontinued - she underwent heart cath in [place] 08/2014 that showed no obstructive CAD, CHF felt NICM and GDMT titrated to 97-103 mg entresto BID, coreg 6.25 mg BID, and 12.5 mg spironolactone - echo 2020 in the setting of acute PE with preserved EF 65-70% - she moved to [place] in 2019 --> established with AHF in 2021 during a hospitalization with acute CHF with LVEF now down to 20-25% and grade 2 DD, mildly reduced RV function - cardiac MR 07/2019 that showed reduced EF and moderate to severe MR felt functional - findings consistent with NICM --> she established with AHF clinic  - GDMT restarted and EF improved to 40-45% in 03/2020, back down to 30-35% 01/2021 - had been out of all HF medications x 8 months (as of 08/2019 canceled all follow up appts with AHF) - presented with flank pain this admission found to have LVEF 15%, mildly reduced RV function, severe LAE, moderate MR - PTA was on losartan, spironolactone, and farxiga, could not afford entresto previously, BB discontinued for lower HR - currently on 12.5 mg spironolactone and 25 mg losartan - not on IV lasix - she reports dyspnea on exertion, but does not appear volume up on exam, mildly tachycardic with minimal movement   Elevated troponin - HS troponin  trended to >24k - no chest pain or EKG changes concerning for STEMI - planning for right and left heart cath this morning - if normal, may need repeat cMRI to look for LV thrombus and rule out myocarditis   Hx of PE - acute PE 03/2018, admitted for chest pain - this was felt provoked by travel when she moved  from [place] to [facility] - OAC was started with eliquis but discontinued after 1 months due to cost   Acute to subacute Splenic infarct - on CTA this admission   Question of LV thrombus - CTA mentions cardiomegaly with plaque-like hypodensity along endocardial surface towards left ventriclar apex concerning for adherent thrombus in the the LV, but no clear thrombus on echo - continue heparin gtt      For questions or updates, please contact [facility] Please consult [email or web address] for contact info under        Signed, Marcelino Duster, PA  11/30/2022, 8:34 AM    Patient seen and examined with Bettina Gavia PA.  Agree as above, with the following exceptions and changes as noted below. No CP or SOB, does have right radial soreness. Gen: NAD, CV: RRR, no murmurs, Lungs: clear, Abd: soft, Extrem: Warm, well perfused, no edema, Neuro/Psych: alert and oriented x 3, normal mood and affect. All available labs, radiology testing, previous records reviewed. NICM now with concern for coronary embolic event vs myocarditis. Will do CMR tomorrow to evaluate and compare to prior CMR which previously showed NICM. Also plan to evaluate for apical thrombus. Can probably go back on DOAC now post cath, will check with pharmacist. Elevated LVEDP but radial precautions post cath and MRI in am, will give a dose of lasix after MRI tomorrow. Clinically appears stable.   Parke Poisson, MD 11/30/22 2:57 PM

## 2022-11-30 NOTE — Telephone Encounter (Signed)
Pharmacy Patient Advocate Encounter   Received notification that prior authorization for Eliquis 5MG  tablets is required/requested.   Insurance verification completed.   The patient is insured through [employer] .   Per test claim: PA required; PA submitted to [employer] via CoverMyMeds Key/confirmation #/EOC [id number] Status is pending

## 2022-11-30 NOTE — Progress Notes (Signed)
[facility]  PROGRESS NOTE  Alexandria Rhodes WJX:[id number] DOB: 12/30/1975 DOA: 11/26/2022 PCP: Lorenda Ishihara, MD   Brief HPI:   47 y.o. female with medical history significant of hypertension, HFrEF, history of pulmonary embolism not on anticoagulation, and GERD presents with complaints of left-sided flank pain.  CT scan of the abdomen and pelvis with contrast noted segment hypoperfusion of the left kidney compatible with infarction with interval development of nonacute splenic infarct since 09/02/2022, cardiomegaly with plaque-like hypodensity along the endocardial surface towards the left ventricular apex concerning for adherent thrombus in the left ventricle.  Cardiology was consulted, echocardiogram ordered.  Patient started on apixaban.    Assessment/Plan:    Splenic and renal infarct -Flank pain has improved -Presented with left-sided flank pain -No fever or dysuria -CT abdomen/pelvis showed hypoperfusion of the left kidney compatible with infarction with interval development of known acute splenic infarct since 09/02/2022 -Started on Eliquis; switched to IV heparin for cardiac catheterization -Continue IV heparin per pharmacy  ?  Left mural thrombus -CT scan showed endocardial surface towards the left ventricular apex with plaque-like hypodensity, likely thrombus -2D echo ordered which was negative for thrombus -Cardiac cath did not show mural thrombus, cardiac MRI ordered -Cardiology plans to do cardiac cath on Monday due to elevated troponin -Apixaban changed to IV heparin  Troponin elevation -Troponin was elevated to 24,000 -Patient not having any chest pain -EKG showed nonspecific T wave abnormality -Started on heparin, aspirin -Cardiac catheterization showed normal coronaries,  Chronic systolic CHF Dilated cardiomyopathy Patient does not appear grossly fluid overloaded at this time.  Last echocardiogram noted EF to be 30 to 35% with grade 2 diastolic  dysfunction back in 04/1306.  -BNP is elevated 1684 - furosemide and Aldactone are currently on hold  -Right and left heart cath showed EF 15%   Essential hypertension Blood pressures with diastolic blood pressures elevated up to 108.  Cardiology started low-dose losartan 25 mg. -Monitor   Renal insufficiency Creatinine elevated up to 1.31 with BUN 10.  Urinalysis did not show any signs of infection.  CT head noted a left renal infarct. -Creatinine has improved to 0.87   History of pulmonary embolism Patient with prior pulmonary embolism back in 03/2018.  She reportedly only took Eliquis for 1 month due to cost.    Medications     aspirin EC  81 mg Oral Daily   losartan  25 mg Oral Daily   nitroGLYCERIN       sodium chloride flush  3 mL Intravenous Q12H   sodium chloride flush  3 mL Intravenous Q12H   spironolactone  12.5 mg Oral Daily     Data Reviewed:   CBG:  No results for input(s): "GLUCAP" in the last 168 hours.  SpO2: 98 % O2 Flow Rate (L/min): 0 L/min FiO2 (%): 21 %    Vitals:   11/30/22 0934 11/30/22 0939 11/30/22 0944 11/30/22 1021  BP: 103/83 104/84 108/78 107/89  Pulse: 93 92 91 98  Resp: (!) 21 13 20 18   Temp:      TempSrc:      SpO2: 97% 99% 95% 98%  Weight:      Height:          Data Reviewed:  Basic Metabolic Panel: Recent Labs  Lab 11/26/22 2324 11/28/22 0432 11/29/22 0601 11/30/22 0412  NA 141 137 136 135  K 4.4 3.4* 3.8 3.9  CL 102 101 102 104  CO2 23 23 24 22   GLUCOSE 116* 122*  102* 102*  BUN 10 12 13 17   CREATININE 1.31* 1.09* 0.87 0.95  CALCIUM 9.8 9.2 9.1 8.9    CBC: Recent Labs  Lab 11/26/22 2324 11/28/22 0432 11/29/22 0601 11/30/22 0412  WBC 7.6 7.8 6.4 6.0  NEUTROABS 5.5  --   --   --   HGB 12.5 11.8* 11.0* 11.1*  HCT 40.5 36.9 35.4* 34.4*  MCV 89.8 87.2 89.4 89.1  PLT 303 276 249 258    LFT Recent Labs  Lab 11/26/22 2324 11/28/22 0432  AST 86* 76*  ALT 21 22  ALKPHOS 73 65  BILITOT 1.0 1.0  PROT  7.9 6.8  ALBUMIN 4.1 3.3*     Antibiotics: Anti-infectives (From admission, onward)    None        DVT prophylaxis: Apixaban  Code Status: Full code  Family Communication: No family at bedside   CONSULTS    Subjective   Flank pain has improved, underwent cardiac cath today which showed normal clean coronaries.  Objective    Physical Examination:  General-appears in no acute distress Heart-S1-S2, regular, no murmur auscultated Lungs-clear to auscultation bilaterally, no wheezing or crackles auscultated Abdomen-soft, nontender, no organomegaly Extremities-no edema in the lower extremities Neuro-alert, oriented x3, no focal deficit noted  Status is: Inpatient:        Meredeth Ide   [facility] If 7PM-7AM, please contact night-coverage at [email or web address], Office  [phone]   11/30/2022, 11:48 AM  LOS: 2 days

## 2022-11-30 NOTE — Telephone Encounter (Signed)
Pharmacy Patient Advocate Encounter  Received notification from [employer] that Prior Authorization for Eliquis 5MG  tablets  has been APPROVED from 11/30/2022 to 11/30/2023. Ran test claim, Copay is $0.00. This test claim was processed through [facility]- copay amounts may vary at other pharmacies due to pharmacy/plan contracts, or as the patient moves through the different stages of their insurance plan.   PA #/Case ID/Reference #: [id number]

## 2022-11-30 NOTE — Progress Notes (Signed)
Heart Failure Navigator Progress Note  Assessed for Heart & Vascular TOC clinic readiness.  Patient does not meet criteria due to Advanced Heart Failure Team patient of Dr. Gala Romney.   Navigator will sign off at this time.   Rhae Hammock, BSN, Scientist, clinical (histocompatibility and immunogenetics) Only

## 2022-11-30 NOTE — Progress Notes (Addendum)
ANTICOAGULATION CONSULT NOTE  Pharmacy Consult for heparin Indication: chest pain/ACS and potential LV thrombus  Allergies  Allergen Reactions   Shellfish Allergy Anaphylaxis and Hives    Patient Measurements: Height: 5\' 1"  (154.9 cm) Weight: 76.1 kg (167 lb 11.2 oz) IBW/kg (Calculated) : 47.8 Heparin Dosing Weight: 63.9 kg   Vital Signs: Temp: 98.2 F (36.8 C) (09/16 0751) Temp Source: Oral (09/16 0751) BP: 107/89 (09/16 1021) Pulse Rate: 98 (09/16 1021)  Labs: Recent Labs    11/27/22 1529 11/28/22 0432 11/28/22 0432 11/29/22 0601 11/29/22 1413 11/30/22 0412  HGB  --  11.8*   < > 11.0*  --  11.1*  HCT  --  36.9  --  35.4*  --  34.4*  PLT  --  276  --  249  --  258  APTT  --   --   --  38* 75* 90*  HEPARINUNFRC  --   --   --  0.80* 0.74* 0.61  CREATININE  --  1.09*  --  0.87  --  0.95  TROPONINIHS >24,000*  --   --   --   --   --    < > = values in this interval not displayed.    Estimated Creatinine Clearance: 67.6 mL/min (by C-G formula based on SCr of 0.95 mg/dL).   Medical History: Past Medical History:  Diagnosis Date   Acute pulmonary embolism (HCC)    Cardiomyopathy (HCC)    CHF (congestive heart failure) (HCC)    Hypertension    Non-ischemic cardiomyopathy (HCC) 07/2019    Medications:  Scheduled:   aspirin EC  81 mg Oral Daily   losartan  25 mg Oral Daily   nitroGLYCERIN       sodium chloride flush  3 mL Intravenous Q12H   sodium chloride flush  3 mL Intravenous Q12H   spironolactone  12.5 mg Oral Daily   Infusions:   sodium chloride 75 mL/hr at 11/30/22 1017   sodium chloride      Assessment: 47 yo female who presented with L sided flank pain. Trop > 24,000. Was on eliquis for 1 month s/p PE back in 2020 but did not continue due to cost. Was started on eliquis inpt with questionable LV thrombus, last dose 9/14 1000.  Pharmacy consulted to dose heparin in setting of possible LV thrombus  -she is now s/p cath and heparin to  restart  Goal of Therapy:  Heparin level 0.3-0.7 units/ml aPTT 66-102 seconds Monitor platelets by anticoagulation protocol: Yes   Plan:  Restart heparin 1200 units/hr 1-2 hours after TR band removal Daily CBC, aPTT and Heparin level   Harland German, PharmD Clinical Pharmacist **Pharmacist phone directory can now be found on [email or web address] ([email or web address]).  Listed under [facility].  Addendum -May transition to apixaban per Dr. Jacques Navy  Plan -d/c heparin  -start apixaban 5mg  po bid tonight  Harland German, PharmD Clinical Pharmacist **Pharmacist phone directory can now be found on [email or web address] ([email or web address]).  Listed under [facility].

## 2022-11-30 NOTE — Interval H&P Note (Signed)
Cath Lab Visit (complete for each Cath Lab visit)  Clinical Evaluation Leading to the Procedure:   ACS: Yes.    Non-ACS:    Anginal Classification: CCS II  Anti-ischemic medical therapy: No Therapy  Non-Invasive Test Results: No non-invasive testing performed  Prior CABG: No previous CABG      History and Physical Interval Note:  11/30/2022 9:00 AM  Roma Schanz  has presented today for surgery, with the diagnosis of heart failure.  The various methods of treatment have been discussed with the patient and family. After consideration of risks, benefits and other options for treatment, the patient has consented to  Procedure(s): RIGHT/LEFT HEART CATH AND CORONARY ANGIOGRAPHY (N/A) as a surgical intervention.  The patient's history has been reviewed, patient examined, no change in status, stable for surgery.  I have reviewed the patient's chart and labs.  Questions were answered to the patient's satisfaction.     Nanetta Batty

## 2022-11-30 NOTE — TOC Benefit Eligibility Note (Signed)
Patient Product/process development scientist completed.    The patient is insured through [place] and [place] MEDICAID.     Ran test claim for Entresto 24-26 mg and the current 30 day co-pay is $0.00.  Ran test claim for Farxiga 10mg  and the current 30 day co-pay is $0.00.  Ran test claim for Eliquis 5 mg and Requires Prior Authorization  Ran test claim for Jardiance 10 mg and Non Formulary     This test claim was processed through [employer]- copay amounts may vary at other pharmacies due to [employer], or as the patient moves through the different stages of their insurance plan.     Roland Earl, CPHT Pharmacy Technician III Certified Patient Advocate [facility] Patient Advocate Team Direct Number: [phone]  Fax: [phone]

## 2022-12-01 ENCOUNTER — Encounter (HOSPITAL_COMMUNITY): Payer: Self-pay | Admitting: Cardiovascular Disease

## 2022-12-01 ENCOUNTER — Inpatient Hospital Stay (HOSPITAL_COMMUNITY): Payer: Commercial Managed Care - HMO

## 2022-12-01 DIAGNOSIS — I361 Nonrheumatic tricuspid (valve) insufficiency: Secondary | ICD-10-CM

## 2022-12-01 DIAGNOSIS — I513 Intracardiac thrombosis, not elsewhere classified: Secondary | ICD-10-CM | POA: Diagnosis not present

## 2022-12-01 DIAGNOSIS — D735 Infarction of spleen: Secondary | ICD-10-CM | POA: Diagnosis not present

## 2022-12-01 DIAGNOSIS — I340 Nonrheumatic mitral (valve) insufficiency: Secondary | ICD-10-CM

## 2022-12-01 DIAGNOSIS — N280 Ischemia and infarction of kidney: Secondary | ICD-10-CM | POA: Diagnosis not present

## 2022-12-01 DIAGNOSIS — I5022 Chronic systolic (congestive) heart failure: Secondary | ICD-10-CM | POA: Diagnosis not present

## 2022-12-01 LAB — BASIC METABOLIC PANEL
Anion gap: 8 (ref 5–15)
BUN: 18 mg/dL (ref 6–20)
CO2: 20 mmol/L — ABNORMAL LOW (ref 22–32)
Calcium: 9.3 mg/dL (ref 8.9–10.3)
Chloride: 105 mmol/L (ref 98–111)
Creatinine, Ser: 0.79 mg/dL (ref 0.44–1.00)
GFR, Estimated: >60 mL/min (ref >60)
Glucose, Bld: 139 mg/dL — ABNORMAL HIGH (ref 70–99)
Potassium: 4.5 mmol/L (ref 3.5–5.1)
Sodium: 133 mmol/L — ABNORMAL LOW (ref 135–145)

## 2022-12-01 LAB — CBC
HCT: 35 % — ABNORMAL LOW (ref 36.0–46.0)
Hemoglobin: 10.8 g/dL — ABNORMAL LOW (ref 12.0–15.0)
MCH: 28.3 pg (ref 26.0–34.0)
MCHC: 30.9 g/dL (ref 30.0–36.0)
MCV: 91.9 fL (ref 80.0–100.0)
Platelets: 278 10*3/uL (ref 150–400)
RBC: 3.81 MIL/uL — ABNORMAL LOW (ref 3.87–5.11)
RDW: 15.4 % (ref 11.5–15.5)
WBC: 6.3 10*3/uL (ref 4.0–10.5)
nRBC: 0 % (ref 0.0–0.2)

## 2022-12-01 LAB — GLUCOSE, CAPILLARY: Glucose-Capillary: 133 mg/dL — ABNORMAL HIGH (ref 70–99)

## 2022-12-01 MED ORDER — FUROSEMIDE 10 MG/ML IJ SOLN
40.0000 mg | Freq: Once | INTRAMUSCULAR | Status: AC
  Administered 2022-12-01: 40 mg via INTRAVENOUS
  Filled 2022-12-01: qty 4

## 2022-12-01 MED ORDER — GADOBUTROL 1 MMOL/ML IV SOLN
10.0000 mL | Freq: Once | INTRAVENOUS | Status: AC | PRN
  Administered 2022-12-01: 10 mL via INTRAVENOUS

## 2022-12-01 NOTE — Progress Notes (Signed)
[facility]  PROGRESS NOTE  Alexandria Rhodes ZOX:[id number] DOB: 10/14/1975 DOA: 11/26/2022 PCP: Lorenda Ishihara, MD   Brief HPI:   47 y.o. female with medical history significant of hypertension, HFrEF, history of pulmonary embolism not on anticoagulation, and GERD presents with complaints of left-sided flank pain.  CT scan of the abdomen and pelvis with contrast noted segment hypoperfusion of the left kidney compatible with infarction with interval development of nonacute splenic infarct since 09/02/2022, cardiomegaly with plaque-like hypodensity along the endocardial surface towards the left ventricular apex concerning for adherent thrombus in the left ventricle.  Cardiology was consulted, echocardiogram ordered.  Patient started on apixaban.    Assessment/Plan:   Splenic and renal infarct -Flank pain has improved -Presented with left-sided flank pain -No fever or dysuria -CT abdomen/pelvis showed hypoperfusion of the left kidney compatible with infarction with interval development of known acute splenic infarct since 09/02/2022 -Started on Eliquis; switched to IV heparin for cardiac catheterization -Now IV heparin has been discontinued patient started back on apixaban  ? Left mural thrombus -CT scan showed endocardial surface towards the left ventricular apex with plaque-like hypodensity, likely thrombus -2D echo ordered which was negative for thrombus -Cardiac cath did not show mural thrombus, cardiac MRI ordered -Underwent cardiac cath which showed normal coronaries -Cardiac MRI done this morning , result pending -A continue apixaban  Troponin elevation -Troponin was elevated to 24,000 -Patient not having any chest pain -EKG showed nonspecific T wave abnormality -Started on heparin, aspirin -Cardiac catheterization showed normal coronaries,  Chronic systolic CHF Dilated cardiomyopathy Patient does not appear grossly fluid overloaded at this time.  Last  echocardiogram noted EF to be 30 to 35% with grade 2 diastolic dysfunction back in 06/2021.  -BNP is elevated 1684 -Currently on Aldactone and losartan -Right and left heart cath showed EF 15% -GDMT as per cardiology   Essential hypertension Blood pressures with diastolic blood pressures elevated up to 108.   Cardiology started low-dose losartan 25 mg. -Monitor   Renal insufficiency Creatinine elevated up to 1.31 with BUN 10.  Urinalysis did not show any signs of infection.  CT head noted a left renal infarct. -Creatinine has improved to 0.87   History of pulmonary embolism Patient with prior pulmonary embolism back in 03/2018.  She reportedly only took Eliquis for 1 month due to cost.    Medications     apixaban  5 mg Oral BID   losartan  25 mg Oral Daily   sodium chloride flush  3 mL Intravenous Q12H   sodium chloride flush  3 mL Intravenous Q12H   spironolactone  12.5 mg Oral Daily     Data Reviewed:   CBG:  Recent Labs  Lab 12/01/22 1005  GLUCAP 133*    SpO2: 100 % O2 Flow Rate (L/min): 0 L/min FiO2 (%): 21 %    Vitals:   11/30/22 2001 11/30/22 2100 12/01/22 0446 12/01/22 0827  BP: 116/85 116/85 (!) 110/98 (!) 127/94  Pulse: (!) 102 91 92 (!) 109  Resp: 20 20 18 17   Temp: 98.3 F (36.8 C) 98.3 F (36.8 C) 97.6 F (36.4 C) 98.1 F (36.7 C)  TempSrc: Oral  Oral Oral  SpO2: 100% 100% 98% 100%  Weight:   79.9 kg   Height:          Data Reviewed:  Basic Metabolic Panel: Recent Labs  Lab 11/26/22 2324 11/28/22 0432 11/29/22 0601 11/30/22 0412 11/30/22 0936 11/30/22 0938 11/30/22 0939 12/01/22 0444  NA 141 137 136  135 139 137 133* 133*  K 4.4 3.4* 3.8 3.9 3.8 3.8 3.7 4.5  CL 102 101 102 104  --   --   --  105  CO2 23 23 24 22   --   --   --  20*  GLUCOSE 116* 122* 102* 102*  --   --   --  139*  BUN 10 12 13 17   --   --   --  18  CREATININE 1.31* 1.09* 0.87 0.95  --   --   --  0.79  CALCIUM 9.8 9.2 9.1 8.9  --   --   --  9.3     CBC: Recent Labs  Lab 11/26/22 2324 11/28/22 0432 11/29/22 0601 11/30/22 0412 11/30/22 0936 11/30/22 0938 11/30/22 0939 12/01/22 0444  WBC 7.6 7.8 6.4 6.0  --   --   --  6.3  NEUTROABS 5.5  --   --   --   --   --   --   --   HGB 12.5 11.8* 11.0* 11.1* 11.2* 11.6* 11.6* 10.8*  HCT 40.5 36.9 35.4* 34.4* 33.0* 34.0* 34.0* 35.0*  MCV 89.8 87.2 89.4 89.1  --   --   --  91.9  PLT 303 276 249 258  --   --   --  278    LFT Recent Labs  Lab 11/26/22 2324 11/28/22 0432  AST 86* 76*  ALT 21 22  ALKPHOS 73 65  BILITOT 1.0 1.0  PROT 7.9 6.8  ALBUMIN 4.1 3.3*     Antibiotics: Anti-infectives (From admission, onward)    None        DVT prophylaxis: Apixaban  Code Status: Full code  Family Communication: No family at bedside   CONSULTS    Subjective   Flank pain improved  Objective    Physical Examination:  General-appears in no acute distress Heart-S1-S2, regular, no murmur auscultated Lungs-clear to auscultation bilaterally, no wheezing or crackles auscultated Abdomen-soft, nontender, no organomegaly Extremities-no edema in the lower extremities Neuro-alert, oriented x3, no focal deficit noted Status is: Inpatient:        Alexandria Rhodes   [facility] If 7PM-7AM, please contact night-coverage at [email or web address], Office  [phone]   12/01/2022, 10:59 AM  LOS: 3 days

## 2022-12-01 NOTE — TOC CM/SW Note (Signed)
Transition of Care [facility]) - Inpatient Brief Assessment   Patient Details  Name: Alexandria Rhodes MRN: [id number] Date of Birth: [birth year]  Transition of Care [facility]) CM/SW Contact:    Gala Lewandowsky, RN Phone Number: 12/01/2022, 10:26 AM   Clinical Narrative: Patient presented for flank pain. Case Manager received a consult for medication assistance. Patient has Rosann Auerbach and the benefits check was submitted for Eliquis and co pay is zero dollars. No further needs identified at this time.     Transition of Care Asessment: Insurance and Status: Insurance coverage has been reviewed Patient has primary care physician: Yes Prior/Current Home Services: No current home services Social Determinants of Health Reivew: SDOH reviewed no interventions necessary Readmission risk has been reviewed: Yes Transition of care needs: transition of care needs identified, TOC will continue to follow

## 2022-12-01 NOTE — Progress Notes (Addendum)
Rounding Note    Patient Name: Alexandria Rhodes Date of Encounter: 12/01/2022  [facility] Cardiologist: Arvilla Meres, MD   Subjective   Seen after cMRI this morning. No complaints other than being tired. She reports dyspnea with walking in the room. Will attempt to add GDMT today and needs to walk in the hall.  Sinus tachycardia while resting in bed, asymptomatic.  Inpatient Medications    Scheduled Meds:  apixaban  5 mg Oral BID   losartan  25 mg Oral Daily   sodium chloride flush  3 mL Intravenous Q12H   sodium chloride flush  3 mL Intravenous Q12H   spironolactone  12.5 mg Oral Daily   Continuous Infusions:  sodium chloride     PRN Meds: sodium chloride, acetaminophen **OR** acetaminophen, acetaminophen, albuterol, HYDROcodone-acetaminophen, morphine injection, ondansetron **OR** ondansetron (ZOFRAN) IV, ondansetron (ZOFRAN) IV, sodium chloride flush   Vital Signs    Vitals:   11/30/22 1516 11/30/22 2001 11/30/22 2100 12/01/22 0446  BP:  116/85 116/85 (!) 110/98  Pulse:  (!) 102 91 92  Resp: 16 20 20 18   Temp: 97.8 F (36.6 C) 98.3 F (36.8 C) 98.3 F (36.8 C) 97.6 F (36.4 C)  TempSrc: Oral Oral  Oral  SpO2:  100% 100% 98%  Weight:    79.9 kg  Height:        Intake/Output Summary (Last 24 hours) at 12/01/2022 0741 Last data filed at 11/30/2022 1545 Gross per 24 hour  Intake 358.03 ml  Output 1 ml  Net 357.03 ml      12/01/2022    4:46 AM 11/30/2022    5:05 AM 11/27/2022    7:00 AM  Last 3 Weights  Weight (lbs) 176 lb 2.4 oz 167 lb 11.2 oz 162 lb  Weight (kg) 79.9 kg 76.068 kg 73.483 kg      Telemetry    Sinus rhythm in the 80s --> atrial tachycardia, at least one episodes began with a PAC, HR in the 107-109 range - Personally Reviewed  ECG    No new tracings - Personally Reviewed  Physical Exam   GEN: No acute distress.   Neck: No JVD Cardiac: regular rhythm, tachycardic rate Respiratory: Clear to auscultation  bilaterally. GI: Soft, nontender, non-distended  MS: No edema; No deformity. Neuro:  Nonfocal  Psych: Normal affect   Labs    High Sensitivity Troponin:   Recent Labs  Lab 11/27/22 1529 11/30/22 1156 11/30/22 1506  TROPONINIHS >24,000* 3,235* 2,157*     Chemistry Recent Labs  Lab 11/26/22 2324 11/28/22 0432 11/29/22 0601 11/30/22 0412 11/30/22 0936 11/30/22 0938 11/30/22 0939 12/01/22 0444  NA 141 137 136 135   < > 137 133* 133*  K 4.4 3.4* 3.8 3.9   < > 3.8 3.7 4.5  CL 102 101 102 104  --   --   --  105  CO2 23 23 24 22   --   --   --  20*  GLUCOSE 116* 122* 102* 102*  --   --   --  139*  BUN 10 12 13 17   --   --   --  18  CREATININE 1.31* 1.09* 0.87 0.95  --   --   --  0.79  CALCIUM 9.8 9.2 9.1 8.9  --   --   --  9.3  PROT 7.9 6.8  --   --   --   --   --   --   ALBUMIN 4.1 3.3*  --   --   --   --   --   --  AST 86* 76*  --   --   --   --   --   --   ALT 21 22  --   --   --   --   --   --   ALKPHOS 73 65  --   --   --   --   --   --   BILITOT 1.0 1.0  --   --   --   --   --   --   GFRNONAA 50* >60 >60 >60  --   --   --  >60  ANIONGAP 16* 13 10 9   --   --   --  8   < > = values in this interval not displayed.    Lipids No results for input(s): "CHOL", "TRIG", "HDL", "LABVLDL", "LDLCALC", "CHOLHDL" in the last 168 hours.  Hematology Recent Labs  Lab 11/29/22 0601 11/30/22 0412 11/30/22 0936 11/30/22 0938 11/30/22 0939 12/01/22 0444  WBC 6.4 6.0  --   --   --  6.3  RBC 3.96 3.86*  --   --   --  3.81*  HGB 11.0* 11.1*   < > 11.6* 11.6* 10.8*  HCT 35.4* 34.4*   < > 34.0* 34.0* 35.0*  MCV 89.4 89.1  --   --   --  91.9  MCH 27.8 28.8  --   --   --  28.3  MCHC 31.1 32.3  --   --   --  30.9  RDW 15.4 15.5  --   --   --  15.4  PLT 249 258  --   --   --  278   < > = values in this interval not displayed.   Thyroid No results for input(s): "TSH", "FREET4" in the last 168 hours.  BNP Recent Labs  Lab 11/27/22 1529  BNP 1,684.1*    DDimer No results for  input(s): "DDIMER" in the last 168 hours.   Radiology    CARDIAC CATHETERIZATION  Result Date: 11/30/2022 Images from the original result were not included. Alexandria Rhodes is a 47 y.o. female  [id number] LOCATION:  FACILITY: [facility] PHYSICIAN: Nanetta Batty, M.D. 07-24-1975 DATE OF PROCEDURE:  11/30/2022 DATE OF DISCHARGE: CARDIAC CATHETERIZATION History obtained from chart review.47 y.o. female history of HTN, prior PE, HFrEF, presented with left sided flank pain .  Her troponins went up to greater than 24,000.  Her 2D echo revealed severe LV dysfunction with an EF in the 15% range and LV dilatation.  She presents now for right left heart cath to define her anatomy and physiology. HEMODYNAMICS:  1: Right atrial pressure-11/5 2: Right ventricular pressure-37/2 3: Pulmonary artery pressure-40/20, mean 27 4: Pulmonary wedge pressure-A-wave 30, V wave 34, mean 26 5: LVEDP-20 6: Cardiac output-4.49 L/min with an index of 2.59 L/min/m   [name] has clean coronary arteries and a "nonischemic cardiomyopathy".  Her wedge is elevated as is her right atrial pressure.  She will need guideline directed optimal medical therapy, continue diuresis, and treatment with oral anticoagulation for her "LV mural thrombus".  The radial sheath was removed and a TR band was placed on the right wrist to achieve patent hemostasis.  The right heart cath and sheath were removed as well and a pressure dressing was applied.  IV heparin will be restarted 4 hours after sheath removal.  She left the lab in stable condition. Nanetta Batty. MD, [facility] 11/30/2022 9:52 AM     Cardiac Studies  Cath 9/16: HEMODYNAMICS:    1: Right atrial pressure-11/5 2: Right ventricular pressure-37/2 3: Pulmonary artery pressure-40/20, mean 27 4: Pulmonary wedge pressure-A-wave 30, V wave 34, mean 26 5: LVEDP-20 6: Cardiac output-4.49 L/min with an index of 2.59 L/min/m   IMPRESSION: [name] has clean coronary arteries and a "nonischemic  cardiomyopathy".  Her wedge is elevated as is her right atrial pressure.  She will need guideline directed optimal medical therapy, continue diuresis, and treatment with oral anticoagulation for her "LV mural thrombus".  The radial sheath was removed and a TR band was placed on the right wrist to achieve patent hemostasis.  The right heart cath and sheath were removed as well and a pressure dressing was applied.  IV heparin will be restarted 4 hours after sheath removal.  She left the lab in stable condition.    Echo 11/27/22: 1. Left ventricular ejection fraction, by estimation, is 15%. The left  ventricle has severely decreased function. The left ventricle demonstrates  global hypokinesis. The left ventricular internal cavity size was severely  dilated. Left ventricular  diastolic parameters are indeterminate.   2. Right ventricular systolic function is mildly reduced. The right  ventricular size is normal. Tricuspid regurgitation signal is inadequate  for assessing PA pressure.   3. Left atrial size was severely dilated.   4. The mitral valve is grossly normal. Moderate mitral valve  regurgitation. No evidence of mitral stenosis.   5. The aortic valve is tricuspid. Aortic valve regurgitation is trivial.  No aortic stenosis is present.   6. The inferior vena cava is dilated in size with <50% respiratory  variability, suggesting right atrial pressure of 15 mmHg.   Patient Profile     47 y.o. 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    Friday evening 11/27/22, hs troponin trended to >24k with no EKG changes or chest pain. Eliquis was transitioned to heparin gtt with plans for right and left heart catheterization today. Suspected embolic event to a coronary artery.   Hx of medication noncompliance due to cost.  Assessment & Plan     Acute on chronic systolic and diastolic heart failure Nonischemic cardiomyopathy History of EF as low as 10% in 2016 improved to 40-45%, lifevest discontinued. She underwent heart cath in [place] 08/2014 that showed no obstructive CAD, CHF felt NICM and GDMT titrated to 97-103 mg entresto BID, coreg 6.25 mg BID, and 12.5 mg spironolactone. Echo 2020 in the setting of acute PE with preserved EF 65-70%. She moved to [place] in 2019 --> established with AHF in 2021 during a hospitalization with acute CHF with LVEF now down to 20-25% and grade 2 DD, mildly reduced RV function. Cardiac MR 07/2019 that showed reduced EF and moderate to severe MR felt functional - findings consistent with NICM --> she established with AHF clinic. GDMT restarted and EF improved to 40-45% in 03/2020, back down to 30-35% 01/2021 - had been out of all HF medications x 8 months (as of 08/2019 canceled all follow up appts with AHF) - presented with flank pain this admission found to have LVEF 15%, mildly reduced RV function, severe LAE, moderate MR - PTA was on losartan, spironolactone, and farxiga, could not afford entresto previously, BB discontinued for lower HR - currently on 12.5 mg spironolactone and 25 mg losartan - not on IV lasix -  heart cath yesterday showed no obstructive disease but elevated wedge and right atrial pressure - since entresto will be $0, will attempt to start this today, BP is 110/98 this morning - can also restart farxiga rather than restarting PO lasix   Atrial tachycardia - will restart BB - previously discontinued prior to this admission for low heart rate - possible conduction disease - may consider heart monitor outpatient - start 25 mg toprol   Elevated troponin - cath with normal coronaries - no chest pain or EKG changes - will plan for cMRI - concern for embolic event vs myocarditis   Hx of PE - acute PE 03/2018, admitted with chest pain - was not longer on OAC, only treated for 1 month - felt  provoked by move from [place] to [place]   Acute to subacute splenic infarct - on CAT this admission - restarted on eliquis last night   Question LV thrombus - not seen on echo - possibly seen on CTA - cMRI today - already on eliquis   Disposition - through [place] and [place] medicaid, eliquis, entresto and farxia will all be zero dollars       For questions or updates, please contact [facility] Please consult [email or web address] for contact info under        Signed, Marcelino Duster, PA  12/01/2022, 7:41 AM    Patient seen and examined with AD PA.  Agree as above, with the following exceptions and changes as noted below. R radial pulse 4/4. Gen: NAD but appears fatigued, CV: RRR, no murmurs, Lungs: clear, Abd: soft, Extrem: Warm, well perfused, no edema, Neuro/Psych: alert and oriented x 3, normal mood and affect. All available labs, radiology testing, previous records reviewed.  Patient returned from cardiac MRI, I discussed with her giving one-time dose of Lasix for elevated wedge pressure given reduced ejection fraction.  Preliminary review of MRI seems to suggest new scar in the inferolateral wall, I will personally interpret this MRI and provide final results to patient later today.  Attempted start GDMT as above.  Parke Poisson, MD 12/01/22 11:27 AM

## 2022-12-01 NOTE — Plan of Care (Signed)

## 2022-12-02 DIAGNOSIS — I513 Intracardiac thrombosis, not elsewhere classified: Secondary | ICD-10-CM | POA: Diagnosis not present

## 2022-12-02 MED ORDER — METOPROLOL SUCCINATE ER 25 MG PO TB24
25.0000 mg | ORAL_TABLET | Freq: Every day | ORAL | Status: DC
  Filled 2022-12-02: qty 1

## 2022-12-02 MED ORDER — FUROSEMIDE 10 MG/ML IJ SOLN
40.0000 mg | Freq: Once | INTRAMUSCULAR | Status: AC
  Administered 2022-12-02: 40 mg via INTRAVENOUS
  Filled 2022-12-02: qty 4

## 2022-12-02 MED ORDER — METOPROLOL SUCCINATE ER 25 MG PO TB24
12.5000 mg | ORAL_TABLET | Freq: Every day | ORAL | Status: DC
  Administered 2022-12-02 – 2022-12-03 (×2): 12.5 mg via ORAL
  Filled 2022-12-02 (×2): qty 1

## 2022-12-02 MED ORDER — DAPAGLIFLOZIN PROPANEDIOL 10 MG PO TABS
10.0000 mg | ORAL_TABLET | Freq: Every day | ORAL | Status: DC
  Administered 2022-12-02 – 2022-12-03 (×2): 10 mg via ORAL
  Filled 2022-12-02 (×2): qty 1

## 2022-12-02 NOTE — Progress Notes (Addendum)
Rounding Note    Patient Name: Alexandria Rhodes Date of Encounter: 12/02/2022  [facility] Cardiologist: Arvilla Meres, MD   Subjective   Pt looks much better today. She states yesterday she had an episode of diaphoresis and dizziness after being NPO and receiving pain medication. She denies chest pain but states she still has DOE - needs to walk in the hall.  Inpatient Medications    Scheduled Meds:  apixaban  5 mg Oral BID   losartan  25 mg Oral Daily   metoprolol succinate  25 mg Oral Daily   sodium chloride flush  3 mL Intravenous Q12H   sodium chloride flush  3 mL Intravenous Q12H   spironolactone  12.5 mg Oral Daily   Continuous Infusions:  sodium chloride     PRN Meds: sodium chloride, acetaminophen **OR** acetaminophen, acetaminophen, albuterol, HYDROcodone-acetaminophen, morphine injection, ondansetron **OR** ondansetron (ZOFRAN) IV, ondansetron (ZOFRAN) IV, sodium chloride flush   Vital Signs    Vitals:   12/01/22 1524 12/01/22 2026 12/02/22 0520 12/02/22 0822  BP: 115/85 108/89 (!) 109/90 118/88  Pulse: 99 100 90   Resp: 18 17 16 16   Temp: 98.1 F (36.7 C) 98.4 F (36.9 C) 98.4 F (36.9 C)   TempSrc: Oral Oral Oral   SpO2: 99% 100% 100% 100%  Weight:   75.9 kg   Height:        Intake/Output Summary (Last 24 hours) at 12/02/2022 0830 Last data filed at 12/02/2022 0829 Gross per 24 hour  Intake 623 ml  Output 0 ml  Net 623 ml      12/02/2022    5:20 AM 12/01/2022    4:46 AM 11/30/2022    5:05 AM  Last 3 Weights  Weight (lbs) 167 lb 6.4 oz 176 lb 2.4 oz 167 lb 11.2 oz  Weight (kg) 75.932 kg 79.9 kg 76.068 kg      Telemetry    Sinus tachycardia in the 100s - Personally Reviewed  ECG    No new tracings - Personally Reviewed  Physical Exam   GEN: No acute distress.   Neck: No JVD Cardiac: regular rhythm, tachycardic rate Respiratory: Clear to auscultation bilaterally. GI: Soft, nontender, non-distended  MS: No edema; No  deformity. Neuro:  Nonfocal  Psych: Normal affect   Labs    High Sensitivity Troponin:   Recent Labs  Lab 11/27/22 1529 11/30/22 1156 11/30/22 1506  TROPONINIHS >24,000* 3,235* 2,157*     Chemistry Recent Labs  Lab 11/26/22 2324 11/28/22 0432 11/29/22 0601 11/30/22 0412 11/30/22 0936 11/30/22 0939 12/01/22 0444 12/02/22 0639  NA 141 137   < > 135   < > 133* 133* 135  K 4.4 3.4*   < > 3.9   < > 3.7 4.5 4.2  CL 102 101   < > 104  --   --  105 106  CO2 23 23   < > 22  --   --  20* 21*  GLUCOSE 116* 122*   < > 102*  --   --  139* 93  BUN 10 12   < > 17  --   --  18 17  CREATININE 1.31* 1.09*   < > 0.95  --   --  0.79 0.92  CALCIUM 9.8 9.2   < > 8.9  --   --  9.3 9.1  PROT 7.9 6.8  --   --   --   --   --   --  ALBUMIN 4.1 3.3*  --   --   --   --   --   --   AST 86* 76*  --   --   --   --   --   --   ALT 21 22  --   --   --   --   --   --   ALKPHOS 73 65  --   --   --   --   --   --   BILITOT 1.0 1.0  --   --   --   --   --   --   GFRNONAA 50* >60   < > >60  --   --  >60 >60  ANIONGAP 16* 13   < > 9  --   --  8 8   < > = values in this interval not displayed.    Lipids No results for input(s): "CHOL", "TRIG", "HDL", "LABVLDL", "LDLCALC", "CHOLHDL" in the last 168 hours.  Hematology Recent Labs  Lab 11/29/22 0601 11/30/22 0412 11/30/22 0936 11/30/22 0938 11/30/22 0939 12/01/22 0444  WBC 6.4 6.0  --   --   --  6.3  RBC 3.96 3.86*  --   --   --  3.81*  HGB 11.0* 11.1*   < > 11.6* 11.6* 10.8*  HCT 35.4* 34.4*   < > 34.0* 34.0* 35.0*  MCV 89.4 89.1  --   --   --  91.9  MCH 27.8 28.8  --   --   --  28.3  MCHC 31.1 32.3  --   --   --  30.9  RDW 15.4 15.5  --   --   --  15.4  PLT 249 258  --   --   --  278   < > = values in this interval not displayed.   Thyroid No results for input(s): "TSH", "FREET4" in the last 168 hours.  BNP Recent Labs  Lab 11/27/22 1529  BNP 1,684.1*    DDimer No results for input(s): "DDIMER" in the last 168 hours.   Radiology     MR CARDIAC MORPHOLOGY W WO CONTRAST  Result Date: 12/02/2022 CLINICAL DATA:  Chest pain/anginal equiv, ECGs or troponin abnormal COMPARISON: Echocardiogram 11/27/22 EXAM: MR CARDIA MORPHOLOGY WITHOUT AND WITH CONTRAST; MR CARDIAC VELOCITY FLOW MAPPING TECHNIQUE: The patient was scanned on a 1.5 Tesla Siemens magnet. A dedicated cardiac coil was used. Functional imaging was done using TrueFisp sequences. 2,3, and 4 chamber views were done to assess for RWMA's. Modified Simpson's rule using a short axis stack was used to calculate an ejection fraction on a dedicated work [employer]. The patient received 10mL GADAVIST GADOBUTROL 1 MMOL/ML IV SOLN. After 10 minutes inversion recovery sequences were used to assess for infiltration and scar tissue. Phase contrast velocity encoded images obtained x 2. This examination is tailored for evaluation cardiac anatomy and function and provides very limited assessment of noncardiac structures, which are accordingly not evaluated during interpretation. If there is clinical concern for extracardiac pathology, further evaluation with CT imaging should be considered. FINDINGS: LEFT VENTRICLE: Severely dilated left ventricular chamber size. Normal left ventricular wall thickness. Severely reduced left ventricular systolic function. LVEF = 13% Severe global hypokinesis Elevated T2 values in the inferolateral wall suggestive of myocardial edema, 57-67 msec. Global T2 = 54 msec Abnormal first pass perfusion: Subendocardial perfusion defect in the mid-apical inferolateral wall, consistent with infarct. There is post contrast delayed  myocardial enhancement: Transmural LGE in the inferolateral wall from mid ventricle to apex. This finding most consistent with infarct, with no viability of this area of myocardium. Normal T1 myocardial nulling kinetics suggest against a diagnosis of cardiac amyloidosis. ECV = 32%, nonspecific elevation. On long TI images, there is concern  for inferoapical very small thrombus (series 75/15 and 76/15). RIGHT VENTRICLE: Normal right ventricular chamber size. Normal right ventricular wall thickness. Severely reduced right ventricular systolic function. RVEF = 14% Global hypokinesis with best preserved function at the annulus. No post contrast delayed myocardial enhancement. ATRIA: Left atrium: Severely dilated Right atrium: Mildly dilated VALVES: Tricuspid aortic valve. PERICARDIUM: Normal pericardium.  Trivial circumferential pericardial effusion. OTHER: No significant extracardiac findings. MEASUREMENTS: Qp/Qs: 1.06 Aortic valve regurgitation: Mild, regurgitant fraction 13% Pulmonary valve regurgitation: Mild, regurgitant fraction 11% Mitral valve regurgitation: Moderate-severe, regurgitant fraction 40% Tricuspid valve regurgitation: Moderate, regurgitant fraction 22% Left ventricle: LV female LV EF: 13 % (Normal 52-79%) Absolute volumes: LV EDV: (Normal 78-167 mL) LV ESV: (Normal 21-64 mL) LV SV: 50mL (Normal 52-114 mL) CO: 4.95L/min (Normal 2.7-6.3 L/min) Indexed volumes: LV EDV: 261mL/sq-m (normal 50-96 mL/sq-m) LV ESV: 122mL/sq-m (Normal 10-40 mL/sq-m) LV SV: 66mL/sq-m (Normal 33-64 mL/sq-m) CI: 2.67L/min/sq-m (normal 1.9-3.9 L/min/sq-m) Right ventricle: RV female RV EF: 14% (normal 52-80%) Absolute volumes: RV EDV: (Normal 79-175 mL) RV ESV: (Normal 13-75 mL) RV SV: 24mL (Normal 56-110 mL) CO: 2.4L/min (Normal 2.7-6 L/min) Indexed volumes: RV EDV: 86mL/sq-m (Normal 51-97 mL/sq-m) RV ESV: 33mL/sq-m (Normal 9-42 mL/sq-m) RV SV: 59mL/sq-m (Normal 35-61 mL/sq-m) CI: 1.29L/min/sq-m (Normal 1.8-3.8 L/min/sq-m) IMPRESSION: 1. Severe left ventricular dilation with severely reduced left ventricular systolic function, LVEF 13%. 2. Normal right ventricular size with severely reduced RV systolic function, RVEF 14%. 3. Transmural delayed myocardial enhancement in the LV mid-apical inferolateral wall with elevated T2 values and  concordant first pass perfusion defect, consistent with acute myocardial infarction. 4. On long inversion time images, there is concern for inferoapical very small thrombus (series 75/15 and 76/15). 5.  Moderate-severe mitral valve regurgitation, RF 40%. 6.  Moderate tricuspid valve regurgitation, RF 22%. Electronically Signed   By: Weston Brass M.D.   On: 12/02/2022 08:28   MR CARDIAC VELOCITY FLOW MAP  Result Date: 12/02/2022 CLINICAL DATA:  Chest pain/anginal equiv, ECGs or troponin abnormal COMPARISON: Echocardiogram 11/27/22 EXAM: MR CARDIA MORPHOLOGY WITHOUT AND WITH CONTRAST; MR CARDIAC VELOCITY FLOW MAPPING TECHNIQUE: The patient was scanned on a 1.5 Tesla Siemens magnet. A dedicated cardiac coil was used. Functional imaging was done using TrueFisp sequences. 2,3, and 4 chamber views were done to assess for RWMA's. Modified Simpson's rule using a short axis stack was used to calculate an ejection fraction on a dedicated work [employer], [employer]. The patient received 10mL GADAVIST GADOBUTROL 1 MMOL/ML IV SOLN. After 10 minutes inversion recovery sequences were used to assess for infiltration and scar tissue. Phase contrast velocity encoded images obtained x 2. This examination is tailored for evaluation cardiac anatomy and function and provides very limited assessment of noncardiac structures, which are accordingly not evaluated during interpretation. If there is clinical concern for extracardiac pathology, further evaluation with CT imaging should be considered. FINDINGS: LEFT VENTRICLE: Severely dilated left ventricular chamber size. Normal left ventricular wall thickness. Severely reduced left ventricular systolic function. LVEF = 13% Severe global hypokinesis Elevated T2 values in the inferolateral wall suggestive of myocardial edema, 57-67 msec. Global T2 = 54 msec Abnormal first pass perfusion: Subendocardial perfusion defect in the mid-apical inferolateral  wall, consistent with  infarct. There is post contrast delayed myocardial enhancement: Transmural LGE in the inferolateral wall from mid ventricle to apex. This finding most consistent with infarct, with no viability of this area of myocardium. Normal T1 myocardial nulling kinetics suggest against a diagnosis of cardiac amyloidosis. ECV = 32%, nonspecific elevation. On long TI images, there is concern for inferoapical very small thrombus (series 75/15 and 76/15). RIGHT VENTRICLE: Normal right ventricular chamber size. Normal right ventricular wall thickness. Severely reduced right ventricular systolic function. RVEF = 14% Global hypokinesis with best preserved function at the annulus. No post contrast delayed myocardial enhancement. ATRIA: Left atrium: Severely dilated Right atrium: Mildly dilated VALVES: Tricuspid aortic valve. PERICARDIUM: Normal pericardium.  Trivial circumferential pericardial effusion. OTHER: No significant extracardiac findings. MEASUREMENTS: Qp/Qs: 1.06 Aortic valve regurgitation: Mild, regurgitant fraction 13% Pulmonary valve regurgitation: Mild, regurgitant fraction 11% Mitral valve regurgitation: Moderate-severe, regurgitant fraction 40% Tricuspid valve regurgitation: Moderate, regurgitant fraction 22% Left ventricle: LV female LV EF: 13 % (Normal 52-79%) Absolute volumes: LV EDV: (Normal 78-167 mL) LV ESV: (Normal 21-64 mL) LV SV: 50mL (Normal 52-114 mL) CO: 4.95L/min (Normal 2.7-6.3 L/min) Indexed volumes: LV EDV: 239mL/sq-m (normal 50-96 mL/sq-m) LV ESV: 149mL/sq-m (Normal 10-40 mL/sq-m) LV SV: 19mL/sq-m (Normal 33-64 mL/sq-m) CI: 2.67L/min/sq-m (normal 1.9-3.9 L/min/sq-m) Right ventricle: RV female RV EF: 14% (normal 52-80%) Absolute volumes: RV EDV: (Normal 79-175 mL) RV ESV: (Normal 13-75 mL) RV SV: 24mL (Normal 56-110 mL) CO: 2.4L/min (Normal 2.7-6 L/min) Indexed volumes: RV EDV: 37mL/sq-m (Normal 51-97 mL/sq-m) RV ESV: 30mL/sq-m (Normal 9-42 mL/sq-m) RV SV: 26mL/sq-m (Normal 35-61  mL/sq-m) CI: 1.29L/min/sq-m (Normal 1.8-3.8 L/min/sq-m) IMPRESSION: 1. Severe left ventricular dilation with severely reduced left ventricular systolic function, LVEF 13%. 2. Normal right ventricular size with severely reduced RV systolic function, RVEF 14%. 3. Transmural delayed myocardial enhancement in the LV mid-apical inferolateral wall with elevated T2 values and concordant first pass perfusion defect, consistent with acute myocardial infarction. 4. On long inversion time images, there is concern for inferoapical very small thrombus (series 75/15 and 76/15). 5.  Moderate-severe mitral valve regurgitation, RF 40%. 6.  Moderate tricuspid valve regurgitation, RF 22%. Electronically Signed   By: Weston Brass M.D.   On: 12/02/2022 08:28   MR CARDIAC VELOCITY FLOW MAP  Result Date: 12/02/2022 CLINICAL DATA:  Chest pain/anginal equiv, ECGs or troponin abnormal COMPARISON: Echocardiogram 11/27/22 EXAM: MR CARDIA MORPHOLOGY WITHOUT AND WITH CONTRAST; MR CARDIAC VELOCITY FLOW MAPPING TECHNIQUE: The patient was scanned on a 1.5 Tesla Siemens magnet. A dedicated cardiac coil was used. Functional imaging was done using TrueFisp sequences. 2,3, and 4 chamber views were done to assess for RWMA's. Modified Simpson's rule using a short axis stack was used to calculate an ejection fraction on a dedicated work [employer], [employer]. The patient received 10mL GADAVIST GADOBUTROL 1 MMOL/ML IV SOLN. After 10 minutes inversion recovery sequences were used to assess for infiltration and scar tissue. Phase contrast velocity encoded images obtained x 2. This examination is tailored for evaluation cardiac anatomy and function and provides very limited assessment of noncardiac structures, which are accordingly not evaluated during interpretation. If there is clinical concern for extracardiac pathology, further evaluation with CT imaging should be considered. FINDINGS: LEFT VENTRICLE: Severely dilated left ventricular  chamber size. Normal left ventricular wall thickness. Severely reduced left ventricular systolic function. LVEF = 13% Severe global hypokinesis Elevated T2 values in the inferolateral wall suggestive of myocardial edema, 57-67 msec. Global T2 = 54 msec Abnormal  first pass perfusion: Subendocardial perfusion defect in the mid-apical inferolateral wall, consistent with infarct. There is post contrast delayed myocardial enhancement: Transmural LGE in the inferolateral wall from mid ventricle to apex. This finding most consistent with infarct, with no viability of this area of myocardium. Normal T1 myocardial nulling kinetics suggest against a diagnosis of cardiac amyloidosis. ECV = 32%, nonspecific elevation. On long TI images, there is concern for inferoapical very small thrombus (series 75/15 and 76/15). RIGHT VENTRICLE: Normal right ventricular chamber size. Normal right ventricular wall thickness. Severely reduced right ventricular systolic function. RVEF = 14% Global hypokinesis with best preserved function at the annulus. No post contrast delayed myocardial enhancement. ATRIA: Left atrium: Severely dilated Right atrium: Mildly dilated VALVES: Tricuspid aortic valve. PERICARDIUM: Normal pericardium.  Trivial circumferential pericardial effusion. OTHER: No significant extracardiac findings. MEASUREMENTS: Qp/Qs: 1.06 Aortic valve regurgitation: Mild, regurgitant fraction 13% Pulmonary valve regurgitation: Mild, regurgitant fraction 11% Mitral valve regurgitation: Moderate-severe, regurgitant fraction 40% Tricuspid valve regurgitation: Moderate, regurgitant fraction 22% Left ventricle: LV female LV EF: 13 % (Normal 52-79%) Absolute volumes: LV EDV: (Normal 78-167 mL) LV ESV: (Normal 21-64 mL) LV SV: 50mL (Normal 52-114 mL) CO: 4.95L/min (Normal 2.7-6.3 L/min) Indexed volumes: LV EDV: 251mL/sq-m (normal 50-96 mL/sq-m) LV ESV: 134mL/sq-m (Normal 10-40 mL/sq-m) LV SV: 43mL/sq-m (Normal 33-64 mL/sq-m) CI:  2.67L/min/sq-m (normal 1.9-3.9 L/min/sq-m) Right ventricle: RV female RV EF: 14% (normal 52-80%) Absolute volumes: RV EDV: (Normal 79-175 mL) RV ESV: (Normal 13-75 mL) RV SV: 24mL (Normal 56-110 mL) CO: 2.4L/min (Normal 2.7-6 L/min) Indexed volumes: RV EDV: 76mL/sq-m (Normal 51-97 mL/sq-m) RV ESV: 63mL/sq-m (Normal 9-42 mL/sq-m) RV SV: 68mL/sq-m (Normal 35-61 mL/sq-m) CI: 1.29L/min/sq-m (Normal 1.8-3.8 L/min/sq-m) IMPRESSION: 1. Severe left ventricular dilation with severely reduced left ventricular systolic function, LVEF 13%. 2. Normal right ventricular size with severely reduced RV systolic function, RVEF 14%. 3. Transmural delayed myocardial enhancement in the LV mid-apical inferolateral wall with elevated T2 values and concordant first pass perfusion defect, consistent with acute myocardial infarction. 4. On long inversion time images, there is concern for inferoapical very small thrombus (series 75/15 and 76/15). 5.  Moderate-severe mitral valve regurgitation, RF 40%. 6.  Moderate tricuspid valve regurgitation, RF 22%. Electronically Signed   By: Weston Brass M.D.   On: 12/02/2022 08:28   CARDIAC CATHETERIZATION  Result Date: 11/30/2022 Images from the original result were not included. Korinne Diiorio is a 48 y.o. female  [id number] LOCATION:  FACILITY: [facility] PHYSICIAN: Nanetta Batty, M.D. June 11, 1975 DATE OF PROCEDURE:  11/30/2022 DATE OF DISCHARGE: CARDIAC CATHETERIZATION History obtained from chart review.47 y.o. female history of HTN, prior PE, HFrEF, presented with left sided flank pain .  Her troponins went up to greater than 24,000.  Her 2D echo revealed severe LV dysfunction with an EF in the 15% range and LV dilatation.  She presents now for right left heart cath to define her anatomy and physiology. HEMODYNAMICS:  1: Right atrial pressure-11/5 2: Right ventricular pressure-37/2 3: Pulmonary artery pressure-40/20, mean 27 4: Pulmonary wedge pressure-A-wave 30, V wave 34, mean 26 5:  LVEDP-20 6: Cardiac output-4.49 L/min with an index of 2.59 L/min/m   Ms. Gilleylen has clean coronary arteries and a "nonischemic cardiomyopathy".  Her wedge is elevated as is her right atrial pressure.  She will need guideline directed optimal medical therapy, continue diuresis, and treatment with oral anticoagulation for her "LV mural thrombus".  The radial sheath was removed and a TR band was placed on the right wrist to  achieve patent hemostasis.  The right heart cath and sheath were removed as well and a pressure dressing was applied.  IV heparin will be restarted 4 hours after sheath removal.  She left the lab in stable condition. Nanetta Batty. MD, [facility] 11/30/2022 9:52 AM     Cardiac Studies   Cath 9/16: HEMODYNAMICS:    1: Right atrial pressure-11/5 2: Right ventricular pressure-37/2 3: Pulmonary artery pressure-40/20, mean 27 4: Pulmonary wedge pressure-A-wave 30, V wave 34, mean 26 5: LVEDP-20 6: Cardiac output-4.49 L/min with an index of 2.59 L/min/m   IMPRESSION: Ms. Munsen has clean coronary arteries and a "nonischemic cardiomyopathy".  Her wedge is elevated as is her right atrial pressure.  She will need guideline directed optimal medical therapy, continue diuresis, and treatment with oral anticoagulation for her "LV mural thrombus".  The radial sheath was removed and a TR band was placed on the right wrist to achieve patent hemostasis.  The right heart cath and sheath were removed as well and a pressure dressing was applied.  IV heparin will be restarted 4 hours after sheath removal.  She left the lab in stable condition.     Echo 11/27/22: 1. Left ventricular ejection fraction, by estimation, is 15%. The left  ventricle has severely decreased function. The left ventricle demonstrates  global hypokinesis. The left ventricular internal cavity size was severely  dilated. Left ventricular  diastolic parameters are indeterminate.   2. Right ventricular systolic function is mildly  reduced. The right  ventricular size is normal. Tricuspid regurgitation signal is inadequate  for assessing PA pressure.   3. Left atrial size was severely dilated.   4. The mitral valve is grossly normal. Moderate mitral valve  regurgitation. No evidence of mitral stenosis.   5. The aortic valve is tricuspid. Aortic valve regurgitation is trivial.  No aortic stenosis is present.   6. The inferior vena cava is dilated in size with <50% respiratory  variability, suggesting right atrial pressure of 15 mmHg.   Patient Profile     47 y.o. 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    Friday evening 11/27/22, hs troponin trended to >24k with no EKG changes or chest pain. Eliquis was transitioned to heparin gtt with plans for right and left heart catheterization today. Suspected embolic event to a coronary artery.   Hx of medication noncompliance due to cost.  Assessment & Plan    Acute on chronic systolic and diastolic heart failure Nonischemic cardiomyopathy History of EF as low as 10% in 2016 improved to 40-45%, lifevest discontinued. She underwent heart cath in [place] 08/2014 that showed no obstructive CAD, CHF felt NICM and GDMT titrated to 97-103 mg entresto BID, coreg 6.25 mg BID, and 12.5 mg spironolactone. Echo 2020 in the setting of acute PE with preserved EF 65-70%. She moved to [place] in 2019 --> established with AHF in 2021 during a hospitalization with acute CHF with LVEF now down to 20-25% and grade 2 DD, mildly reduced RV function. Cardiac MR 07/2019 that showed reduced EF and moderate to severe MR felt functional - findings consistent with NICM --> she established with AHF clinic. GDMT restarted and EF improved to 40-45% in 03/2020, back down to 30-35% 01/2021 - had been out of all HF medications x 8 months (as of 08/2019 canceled all follow  up appts with  [facility])  - presented with flank pain found to have LVEF 15%, mildly reduced RV function, severe LAE, moderate MR - PTA: losartan, spironolactone, farxiga - could not afford entresto, BB previously discontinued due to lower HR - LHC yesterday showed no obstructive disease - cMRI yesterday concerning for new scar, suspect due to embolization to coronary artery - GDMT currently: farxiga, 25 mg losartan, 12.5 mg spironolactone - of note: entresto, farxiga, and eliquis should be zero copay - received 40 mg IV lasix OTO yesterday - she is not sure this improved her EF   Sinus tachycardia, ?PAT - holding BB for now given EF 13% - possible conduction disease - consider heart monitor outpatient   Elevated troponin - cath with no obstructive disease - no chest pain - right radial site C/D/I - cMRI with new scar   Hx of PE - PE 03/2018, admitted with chest pain - only treated x 1 month due to cost, felt provoked by move from [place] to [place]   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 Walk in hall today.     For questions or updates, please contact [facility] Please consult [email or web address] for contact info under        Signed, Marcelino Duster, PA  12/02/2022, 8:30 AM    Patient seen and examined with AD PA.  Agree as above, with the following exceptions and changes as noted below.  She appears in brighter spirits today, maintaining hemodynamic stability.  Personally interpreted MRI which demonstrated an inferolateral mid to apical infarct with no viability, LGE is transmural this suggests embolic infarct.  In  addition she has moderate to severe mitral valve regurgitation likely secondary to LV dysfunction with a very low LVEF 13%.  Finally there is concern for very small lv inferoapical thrombus. Gen: NAD, CV: RRR, no murmurs, Lungs: clear, Abd: soft, Extrem: Warm, well perfused, no edema, Neuro/Psych: alert and oriented x 3, normal mood and affect. All available labs, radiology testing, previous records reviewed.  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 Eden Emms who previously was seeing the patient in the hospital.  Recommend cautious up titration of GDMT for heart failure and reestablishing with heart failure clinic through [facility].  Her heart failure therapy has been difficult to titrate, it appears beta-blocker stopped previously due to bradycardia, but also has some atrial arrhythmias which may necessitate reinitiation of low-dose beta-blockade.  I have recommended that the patient ambulate the halls today to evaluate for stability to be discharged, no further inpatient cardiovascular workup is required, but would recommend assuring that patient can function independently at home prior to discharge given the severity of her LV and RV dysfunction.  UOP not charted but just confirmed 1 L in the hat, does not seem to be oliguric. Additional dose of lasix now and strict I/O.  Parke Poisson, MD 12/02/22 11:05 AM

## 2022-12-02 NOTE — Plan of Care (Signed)

## 2022-12-02 NOTE — Plan of Care (Signed)

## 2022-12-02 NOTE — Discharge Instructions (Addendum)
Information on my medicine - ELIQUIS (apixaban)   Why was Eliquis prescribed for you? Eliquis was prescribed for you to reduce the risk of stroke from a clot found in the heart.   What do You need to know about Eliquis ? Take your Eliquis TWICE DAILY - one tablet in the morning and one tablet in the evening with or without food. If you have difficulty swallowing the tablet whole please discuss with your pharmacist how to take the medication safely.  Take Eliquis exactly as prescribed by your doctor and DO NOT stop taking Eliquis without talking to the doctor who prescribed the medication.  Stopping may increase your risk of developing a stroke.  Refill your prescription before you run out.  After discharge, you should have regular check-up appointments with your healthcare provider that is prescribing your Eliquis.  In the future your dose may need to be changed if your kidney function or weight changes by a significant amount or as you get older.  What do you do if you miss a dose? If you miss a dose, take it as soon as you remember on the same day and resume taking twice daily.  Do not take more than one dose of ELIQUIS at the same time to make up a missed dose.  Important Safety Information A possible side effect of Eliquis is bleeding. You should call your healthcare provider right away if you experience any of the following: Bleeding from an injury or your nose that does not stop. Unusual colored urine (red or dark brown) or unusual colored stools (red or black). Unusual bruising for unknown reasons. A serious fall or if you hit your head (even if there is no bleeding).  Some medicines may interact with Eliquis and might increase your risk of bleeding or clotting while on Eliquis. To help avoid this, consult your healthcare provider or pharmacist prior to using any new prescription or non-prescription medications, including herbals, vitamins, non-steroidal anti-inflammatory  drugs (NSAIDs) and supplements.  This website has more information on Eliquis (apixaban): http://[email or web address].com/eliquis/home

## 2022-12-02 NOTE — Progress Notes (Signed)
last 72 hours. Anemia Panel: No results for input(s): "VITAMINB12", "FOLATE", "FERRITIN", "TIBC", "IRON", "RETICCTPCT" in the last 72 hours. Sepsis Labs: No results for input(s): "PROCALCITON", "LATICACIDVEN" in the last 168 hours.  No results found for this or any previous visit (from the past 240 hour(s)).       Radiology Studies: CARDIAC CATHETERIZATION  Result Date: 11/30/2022 Images from the original result were not included. Alexandria Rhodes is a 47 y.o. female  [id number] LOCATION:  FACILITY: [facility] PHYSICIAN: Nanetta Batty, M.D. 01-Aug-1975 DATE OF PROCEDURE:  11/30/2022 DATE OF DISCHARGE: CARDIAC CATHETERIZATION History obtained from chart review.47 y.o. female history of HTN, prior PE, HFrEF, presented with left sided flank pain .  Her troponins went up to greater than 24,000.  Her 2D echo revealed severe LV dysfunction with an EF in the 15% range and LV dilatation.  She presents now for right left heart cath to define her anatomy and physiology. HEMODYNAMICS:  1: Right atrial pressure-11/5 2: Right ventricular pressure-37/2 3: Pulmonary artery pressure-40/20, mean 27 4: Pulmonary wedge pressure-A-wave 30, V wave 34, mean 26 5: LVEDP-20 6:  Cardiac output-4.49 L/min with an index of 2.59 L/min/m   Ms. Stubbins has clean coronary arteries and a "nonischemic cardiomyopathy".  Her wedge is elevated as is her right atrial pressure.  She will need guideline directed optimal medical therapy, continue diuresis, and treatment with oral anticoagulation for her "LV mural thrombus".  The radial sheath was removed and a TR band was placed on the right wrist to achieve patent hemostasis.  The right heart cath and sheath were removed as well and a pressure dressing was applied.  IV heparin will be restarted 4 hours after sheath removal.  She left the lab in stable condition. Nanetta Batty. MD, [facility] 11/30/2022 9:52 AM      Scheduled Meds:  apixaban  5 mg Oral BID   losartan  25 mg Oral Daily   sodium chloride flush  3 mL Intravenous Q12H   sodium chloride flush  3 mL Intravenous Q12H   spironolactone  12.5 mg Oral Daily   Continuous Infusions:  sodium chloride       LOS: 4 days   Time spent:  Azucena Fallen, DO [facility]  If 7PM-7AM, please contact night-coverage [email or web address]  12/02/2022, 7:26 AM  PROGRESS NOTE    Alexandria Rhodes  YQI:[id number] DOB: 05-01-1975 DOA: 11/26/2022 PCP: Lorenda Ishihara, MD   Brief Narrative:  47 y.o. female with medical history significant of hypertension, HFrEF, history of pulmonary embolism not on anticoagulation, and GERD presents with complaints of left-sided flank pain.  CT scan of the abdomen and pelvis with contrast noted segment hypoperfusion of the left kidney compatible with infarction with interval development of nonacute splenic infarct since 09/02/2022, cardiomegaly with plaque-like hypodensity along the endocardial surface towards the left ventricular apex concerning for adherent thrombus in the left ventricle. Cardiology was consulted, echocardiogram ordered.  Patient started on apixaban.   Assessment & Plan:   Principal Problem:   LV (left ventricular) mural thrombus Active Problems:   Renal infarct (HCC)   Chronic systolic CHF (congestive heart failure) (HCC)   HTN (hypertension)   Renal insufficiency   History of pulmonary embolism   Splenic infarct   Severe left ventricular systolic dysfunction (LVSD)   Acute intractable pain secondary to splenic and renal infarct -Flank pain has improved -CT abdomen/pelvis showed hypoperfusion of the left kidney compatible with infarction with interval development of known acute splenic infarct since 09/02/2022 - IV heparin for cardiac catheterization -back on Eliquis   Questionable left mural thrombus -CT scan showed endocardial surface towards the left ventricular apex with plaque-like hypodensity, likely thrombus -2D echo ordered which was negative for thrombus -Cardiac cath did not show mural thrombus, cardiac MRI ordered -Underwent cardiac cath which showed normal coronaries -Cardiac MRI done this morning , result pending -A continue apixaban   Troponin elevation -Likely secondary to above, continue anticoagulation, aspirin -Cardiac rotation without overt stenosis or  blockage -Cardiac MRI shows inferolateral mid to apical infarct, likely embolic in nature  Acute on chronic systolic CHF - Dilated cardiomyopathy -Follow I's and O's, diuretics per cardiology ongoing -Continue losartan, spironolactone Sherryll Burger, Farxiga -EF 15% per cath   Essential hypertension Blood pressures with diastolic blood pressures elevated up to 108.   Cardiology started low-dose losartan 25 mg. -Monitor   AKI, resolved -Bland UA, CT noted renal infarct, left -Creatinine back to baseline/wnl  History of pulmonary embolism Patient with prior pulmonary embolism back in 03/2018.  She reportedly only took Eliquis for 1 month due to cost and discontinued.   History of medication noncompliance -As above, lengthy discussion in regards to need for medication and follow-up compliance due to multiple high risk diagnoses  DVT prophylaxis:  apixaban (ELIQUIS) tablet 5 mg   Code Status:   Code Status: Full Code  Family Communication: None present  Status is: Inpatient  Dispo: The patient is from: Home              Anticipated d/c is to: Home              Anticipated d/c date is: 24 to 48 hours              Patient currently not medically stable for discharge  Consultants:  Cardiology  Procedures:  Left heart cath  Antimicrobials:  None indicated  Subjective: No acute issues or events overnight denies nausea vomiting diarrhea constipation headache fevers chills chest pain shortness of breath.  Objective: Vitals:   12/01/22 1430 12/01/22 1524 12/01/22 2026 12/02/22 0520  BP:  115/85 108/89 (!) 109/90  Pulse: (!) 108 99 100 90  Resp:  18 17 16   Temp:  98.1 F (36.7 C) 98.4 F (36.9 C) 98.4 F (36.9 C)  TempSrc:  Oral Oral Oral  PROGRESS NOTE    Alexandria Rhodes  YQI:[id number] DOB: 05-01-1975 DOA: 11/26/2022 PCP: Lorenda Ishihara, MD   Brief Narrative:  47 y.o. female with medical history significant of hypertension, HFrEF, history of pulmonary embolism not on anticoagulation, and GERD presents with complaints of left-sided flank pain.  CT scan of the abdomen and pelvis with contrast noted segment hypoperfusion of the left kidney compatible with infarction with interval development of nonacute splenic infarct since 09/02/2022, cardiomegaly with plaque-like hypodensity along the endocardial surface towards the left ventricular apex concerning for adherent thrombus in the left ventricle. Cardiology was consulted, echocardiogram ordered.  Patient started on apixaban.   Assessment & Plan:   Principal Problem:   LV (left ventricular) mural thrombus Active Problems:   Renal infarct (HCC)   Chronic systolic CHF (congestive heart failure) (HCC)   HTN (hypertension)   Renal insufficiency   History of pulmonary embolism   Splenic infarct   Severe left ventricular systolic dysfunction (LVSD)   Acute intractable pain secondary to splenic and renal infarct -Flank pain has improved -CT abdomen/pelvis showed hypoperfusion of the left kidney compatible with infarction with interval development of known acute splenic infarct since 09/02/2022 - IV heparin for cardiac catheterization -back on Eliquis   Questionable left mural thrombus -CT scan showed endocardial surface towards the left ventricular apex with plaque-like hypodensity, likely thrombus -2D echo ordered which was negative for thrombus -Cardiac cath did not show mural thrombus, cardiac MRI ordered -Underwent cardiac cath which showed normal coronaries -Cardiac MRI done this morning , result pending -A continue apixaban   Troponin elevation -Likely secondary to above, continue anticoagulation, aspirin -Cardiac rotation without overt stenosis or  blockage -Cardiac MRI shows inferolateral mid to apical infarct, likely embolic in nature  Acute on chronic systolic CHF - Dilated cardiomyopathy -Follow I's and O's, diuretics per cardiology ongoing -Continue losartan, spironolactone Sherryll Burger, Farxiga -EF 15% per cath   Essential hypertension Blood pressures with diastolic blood pressures elevated up to 108.   Cardiology started low-dose losartan 25 mg. -Monitor   AKI, resolved -Bland UA, CT noted renal infarct, left -Creatinine back to baseline/wnl  History of pulmonary embolism Patient with prior pulmonary embolism back in 03/2018.  She reportedly only took Eliquis for 1 month due to cost and discontinued.   History of medication noncompliance -As above, lengthy discussion in regards to need for medication and follow-up compliance due to multiple high risk diagnoses  DVT prophylaxis:  apixaban (ELIQUIS) tablet 5 mg   Code Status:   Code Status: Full Code  Family Communication: None present  Status is: Inpatient  Dispo: The patient is from: Home              Anticipated d/c is to: Home              Anticipated d/c date is: 24 to 48 hours              Patient currently not medically stable for discharge  Consultants:  Cardiology  Procedures:  Left heart cath  Antimicrobials:  None indicated  Subjective: No acute issues or events overnight denies nausea vomiting diarrhea constipation headache fevers chills chest pain shortness of breath.  Objective: Vitals:   12/01/22 1430 12/01/22 1524 12/01/22 2026 12/02/22 0520  BP:  115/85 108/89 (!) 109/90  Pulse: (!) 108 99 100 90  Resp:  18 17 16   Temp:  98.1 F (36.7 C) 98.4 F (36.9 C) 98.4 F (36.9 C)  TempSrc:  Oral Oral Oral

## 2022-12-03 ENCOUNTER — Other Ambulatory Visit (HOSPITAL_COMMUNITY): Payer: Self-pay

## 2022-12-03 DIAGNOSIS — I513 Intracardiac thrombosis, not elsewhere classified: Secondary | ICD-10-CM | POA: Diagnosis not present

## 2022-12-03 LAB — BASIC METABOLIC PANEL
Anion gap: 12 (ref 5–15)
BUN: 18 mg/dL (ref 6–20)
CO2: 26 mmol/L (ref 22–32)
Calcium: 9.7 mg/dL (ref 8.9–10.3)
Chloride: 98 mmol/L (ref 98–111)
Creatinine, Ser: 0.89 mg/dL (ref 0.44–1.00)
GFR, Estimated: >60 mL/min (ref >60)
Glucose, Bld: 98 mg/dL (ref 70–99)
Potassium: 4 mmol/L (ref 3.5–5.1)
Sodium: 136 mmol/L (ref 135–145)

## 2022-12-03 LAB — TSH: TSH: 5.778 u[IU]/mL — ABNORMAL HIGH (ref 0.350–4.500)

## 2022-12-03 LAB — MAGNESIUM: Magnesium: 2.2 mg/dL (ref 1.7–2.4)

## 2022-12-03 MED ORDER — SPIRONOLACTONE 25 MG PO TABS
12.5000 mg | ORAL_TABLET | Freq: Every day | ORAL | 0 refills | Status: DC
  Filled 2022-12-03: qty 15, 30d supply, fill #0

## 2022-12-03 MED ORDER — DAPAGLIFLOZIN PROPANEDIOL 5 MG PO TABS
10.0000 mg | ORAL_TABLET | Freq: Every day | ORAL | 0 refills | Status: AC
Start: 2022-12-03 — End: ?
  Filled 2022-12-03: qty 30, 30d supply, fill #0

## 2022-12-03 MED ORDER — FUROSEMIDE 40 MG PO TABS
40.0000 mg | ORAL_TABLET | Freq: Every day | ORAL | Status: DC
  Administered 2022-12-03: 40 mg via ORAL
  Filled 2022-12-03: qty 1

## 2022-12-03 MED ORDER — FUROSEMIDE 10 MG/ML IJ SOLN
20.0000 mg | Freq: Once | INTRAMUSCULAR | Status: AC
  Administered 2022-12-03: 20 mg via INTRAVENOUS
  Filled 2022-12-03: qty 2

## 2022-12-03 MED ORDER — FUROSEMIDE 40 MG PO TABS
40.0000 mg | ORAL_TABLET | Freq: Every day | ORAL | 0 refills | Status: DC
  Filled 2022-12-03: qty 30, 30d supply, fill #0

## 2022-12-03 MED ORDER — LOSARTAN POTASSIUM 25 MG PO TABS
25.0000 mg | ORAL_TABLET | Freq: Every day | ORAL | 0 refills | Status: DC
  Filled 2022-12-03: qty 30, 30d supply, fill #0

## 2022-12-03 MED ORDER — APIXABAN 5 MG PO TABS
5.0000 mg | ORAL_TABLET | Freq: Two times a day (BID) | ORAL | 0 refills | Status: DC
  Filled 2022-12-03: qty 60, 30d supply, fill #0

## 2022-12-03 MED ORDER — METOPROLOL SUCCINATE ER 25 MG PO TB24
12.5000 mg | ORAL_TABLET | Freq: Every day | ORAL | 0 refills | Status: DC
  Filled 2022-12-03: qty 15, 30d supply, fill #0

## 2022-12-03 NOTE — Progress Notes (Addendum)
Progress Note  Patient Name: Alexandria Rhodes Date of Encounter: 12/03/2022  Primary Cardiologist: Arvilla Meres, MD  Subjective   Still has some left flank pain. No CP or SOB.  Inpatient Medications    Scheduled Meds:  apixaban  5 mg Oral BID   dapagliflozin propanediol  10 mg Oral Daily   losartan  25 mg Oral Daily   metoprolol succinate  12.5 mg Oral Daily   sodium chloride flush  3 mL Intravenous Q12H   sodium chloride flush  3 mL Intravenous Q12H   spironolactone  12.5 mg Oral Daily   Continuous Infusions:  sodium chloride     PRN Meds: sodium chloride, acetaminophen **OR** acetaminophen, acetaminophen, albuterol, HYDROcodone-acetaminophen, morphine injection, ondansetron **OR** ondansetron (ZOFRAN) IV, ondansetron (ZOFRAN) IV, sodium chloride flush   Vital Signs    Vitals:   12/02/22 1313 12/02/22 1512 12/02/22 1924 12/03/22 0355  BP:  (!) 115/94 111/81 113/81  Pulse: 82 78 90 83  Resp:  16 18 20   Temp:  97.6 F (36.4 C) 97.6 F (36.4 C) 98 F (36.7 C)  TempSrc:  Oral Oral Oral  SpO2:  100% 98% 100%  Weight:    73.5 kg  Height:        Intake/Output Summary (Last 24 hours) at 12/03/2022 1027 Last data filed at 12/03/2022 0900 Gross per 24 hour  Intake 480 ml  Output 3900 ml  Net -3420 ml      12/03/2022    3:55 AM 12/02/2022    5:20 AM 12/01/2022    4:46 AM  Last 3 Weights  Weight (lbs) 162 lb 167 lb 6.4 oz 176 lb 2.4 oz  Weight (kg) 73.483 kg 75.932 kg 79.9 kg     Telemetry    NSR, 6 beats NSVT last night - Personally Reviewed  Physical Exam   GEN: No acute distress.  HEENT: Normocephalic, atraumatic, sclera non-icteric. Neck: No JVD or bruits. Cardiac: RRR no murmurs, rubs, or gallops.  Respiratory: Clear to auscultation bilaterally. Breathing is unlabored. GI: Soft, nontender, non-distended, BS +x 4. MS: no deformity. Extremities: No clubbing or cyanosis. No edema. Distal pedal pulses are 2+ and equal bilaterally. Right radial cath site  without hematoma or ecchymosis; good pulse. Neuro:  AAOx3. Follows commands. Psych:  Responds to questions appropriately with a normal affect.  Labs    High Sensitivity Troponin:   Recent Labs  Lab 11/27/22 1529 11/30/22 1156 11/30/22 1506  TROPONINIHS >24,000* 3,235* 2,157*      Cardiac EnzymesNo results for input(s): "TROPONINI" in the last 168 hours. No results for input(s): "TROPIPOC" in the last 168 hours.   Chemistry Recent Labs  Lab 11/26/22 2324 11/28/22 0432 11/29/22 0601 12/01/22 0444 12/02/22 0639 12/03/22 0354  NA 141 137   < > 133* 135 136  K 4.4 3.4*   < > 4.5 4.2 4.0  CL 102 101   < > 105 106 98  CO2 23 23   < > 20* 21* 26  GLUCOSE 116* 122*   < > 139* 93 98  BUN 10 12   < > 18 17 18   CREATININE 1.31* 1.09*   < > 0.79 0.92 0.89  CALCIUM 9.8 9.2   < > 9.3 9.1 9.7  PROT 7.9 6.8  --   --   --   --   ALBUMIN 4.1 3.3*  --   --   --   --   AST 86* 76*  --   --   --   --  ALT 21 22  --   --   --   --   ALKPHOS 73 65  --   --   --   --   BILITOT 1.0 1.0  --   --   --   --   GFRNONAA 50* >60   < > >60 >60 >60  ANIONGAP 16* 13   < > 8 8 12    < > = values in this interval not displayed.     Hematology Recent Labs  Lab 11/29/22 0601 11/30/22 0412 11/30/22 0936 11/30/22 0938 11/30/22 0939 12/01/22 0444  WBC 6.4 6.0  --   --   --  6.3  RBC 3.96 3.86*  --   --   --  3.81*  HGB 11.0* 11.1*   < > 11.6* 11.6* 10.8*  HCT 35.4* 34.4*   < > 34.0* 34.0* 35.0*  MCV 89.4 89.1  --   --   --  91.9  MCH 27.8 28.8  --   --   --  28.3  MCHC 31.1 32.3  --   --   --  30.9  RDW 15.4 15.5  --   --   --  15.4  PLT 249 258  --   --   --  278   < > = values in this interval not displayed.    BNP Recent Labs  Lab 11/27/22 1529  BNP 1,684.1*     DDimer No results for input(s): "DDIMER" in the last 168 hours.   Radiology    No results found.  Cardiac Studies   Cath 11/30/22  HEMODYNAMICS:    1: Right atrial pressure-11/5 2: Right ventricular  pressure-37/2 3: Pulmonary artery pressure-40/20, mean 27 4: Pulmonary wedge pressure-A-wave 30, V wave 34, mean 26 5: LVEDP-20 6: Cardiac output-4.49 L/min with an index of 2.59 L/min/m   IMPRESSION: Ms. Stamour has clean coronary arteries and a "nonischemic cardiomyopathy".  Her wedge is elevated as is her right atrial pressure.  She will need guideline directed optimal medical therapy, continue diuresis, and treatment with oral anticoagulation for her "LV mural thrombus".  The radial sheath was removed and a TR band was placed on the right wrist to achieve patent hemostasis.  The right heart cath and sheath were removed as well and a pressure dressing was applied.  IV heparin will be restarted 4 hours after sheath removal.  She left the lab in stable condition.   Nanetta Batty. MD, [facility] 11/30/2022 9:52 AM  2d echo 11/27/22   1. Left ventricular ejection fraction, by estimation, is 15%. The left  ventricle has severely decreased function. The left ventricle demonstrates  global hypokinesis. The left ventricular internal cavity size was severely  dilated. Left ventricular  diastolic parameters are indeterminate.   2. Right ventricular systolic function is mildly reduced. The right  ventricular size is normal. Tricuspid regurgitation signal is inadequate  for assessing PA pressure.   3. Left atrial size was severely dilated.   4. The mitral valve is grossly normal. Moderate mitral valve  regurgitation. No evidence of mitral stenosis.   5. The aortic valve is tricuspid. Aortic valve regurgitation is trivial.  No aortic stenosis is present.   6. The inferior vena cava is dilated in size with <50% respiratory  variability, suggesting right atrial pressure of 15 mmHg.   Conclusion(s)/Recommendation(s): No left ventricular mural or apical  thrombus/thrombi.   Patient Profile     47 y.o. female with HTN, prior PE in 2020  no longer on OAC (felt provoked while moving from [place], only took  1 month due to cost), chronic systolic heart failure secondary to nonischemic cardiomyopathy with variable LVEF 2016 onward, intermittent medication/follow-up lapses, financial challenge affording medications, rash with digoxin, OSA presented with left flank pain found to have a splenic infarct. Echo showed EF 15%.   Assessment & Plan    1. Flank pain with splenic infarct, suspected LV thrombus on cMRI - per d/w Greig Castilla, pharmD, do not usually dose clot dosing with Eliquis to start, so now on 5mg  BID - reported to be zero dollars so cost will not be an issue - anticipate need for lifelong anticoagulation per prior notes  2. Acute on chronic HFrEF/NICM - prior EF as low as 10% in 2016, variable over the years, also with varying degrees of medication adherence and follow-up; peak improvement to 65-70% in 2020 with last echo prior to this admission showing EF 30-35% - cath with clean coronaries - cMRI with LEV 13%, RVEF 14%, delayed myocardial enhancement in the LV mid-apical inferolateral wall with elevated T2 values and concordant first pass perfusion defect, consistent with acute myocardial infarction, moderate-severe MR, moderate TR, suspected small inferoapical LV thrombus - now back on losartan, Toprol 12.5mg  daily, Farxiga 10mg  daily, spironolactone 12.5mg  daily - has received intermittent diuresis, last Lasix dose yesterday - will review plan with MD  3. 2. NSTEMI with troponin >24,000 - cMRI findings c/w acute MI - ? Embolic etiology related to LV thrombus - do we need to add ASA to Eliquis or will Eliquis suffice?  4. Moderate-severe MR, moderate TR by cMRI - will review with MD re: further eval for this  5. Sinus tach, PAT, NSVT - giving only low dose BB given severe LV dysfunction (also previously stopped due to bradycardia per notes) - follow clinically, stable - 6 beats NSVT overnight - lytes OK, no sustained events - add TSH  6. Mild anemia, appears chronic - need to follow  as OP, no bleeding reported  Tentatively arranged f/u 10/1 with CHF clinic  For questions or updates, please contact [facility] Please consult [email or web address] for contact info under Cardiology/STEMI.  Signed, Laurann Montana, PA-C 12/03/2022, 10:27 AM    Patient seen and examined with DD PA-C.  Agree as above, with the following exceptions and changes as noted below. No CP or SOB. Gen: NAD, CV: RRR, 2/6 systolic murmur, Lungs: clear, Abd: soft, Extrem: Warm, well perfused, no edema, Neuro/Psych: alert and oriented x 3, normal mood and affect. All available labs, radiology testing, previous records reviewed.  - eliquis in monotherapy seems reasonable with no CAD - TOC f/u with HF clinic very important.  - MR likely secondary to LV dysfunction - on low dose GDMT, tolerating well.  - she is net negative 1.5 L for admission after two doses of lasix. LVEDP is elevated, hence lasix given. Would consider either an additional dose of lasix IV today otherwise if discharging home we will resume lasix 40 mg po daily home dose. RV function is also severely reduced, consider torsemide at [facility].    Parke Poisson, MD 12/03/22 12:19 PM

## 2022-12-03 NOTE — Discharge Summary (Signed)
Physician Discharge Summary  Sintia Greenly BJY:[id number] DOB: 1975/08/05 DOA: 11/26/2022  PCP: Lorenda Ishihara, MD  Admit date: 11/26/2022 Discharge date: 12/03/2022  Admitted From: Home Disposition: Home  Recommendations for Outpatient Follow-up:  Follow up with PCP in 1-2 weeks Follow-up with cardiology 12/15/2022 as scheduled:  Home Health: None Equipment/Devices: None  Discharge Condition: Stable CODE STATUS: Full Diet recommendation: Low-salt low-fat low-carb diet  Brief/Interim Summary: 47 y.o. female with medical history significant of hypertension, HFrEF, history of pulmonary embolism not on anticoagulation, and GERD presents with complaints of left-sided flank pain.  CT scan of the abdomen and pelvis with contrast noted segment hypoperfusion of the left kidney compatible with infarction with interval development of nonacute splenic infarct since 09/02/2022, cardiomegaly with plaque-like hypodensity along the endocardial surface towards the left ventricular apex concerning for adherent thrombus in the left ventricle. Cardiology was consulted, echocardiogram ordered.  Patient started on apixaban.  Patient admitted as above with acute intractable pain secondary to splenic and renal infarcts likely secondary to noncompliance with anticoagulation.  Imaging confirms left kidney and splenic infarcts as well as questionable LV thrombus.  Transition to Eliquis 5 twice daily.  EF remains low at 15% on cath - tolerating new regimen/diuretics well. Otherwise stable and agreeable for discharge. Lengthy discussion on need for ongoing medication and follow up compliance given the high risk of decompensation should she stop taking these medications again.  Discharge Diagnoses:  Principal Problem:   LV (left ventricular) mural thrombus Active Problems:   Renal infarct (HCC)   Chronic systolic CHF (congestive heart failure) (HCC)   HTN (hypertension)   Renal insufficiency   History of  pulmonary embolism   Splenic infarct   Severe left ventricular systolic dysfunction (LVSD)    Discharge Instructions   Allergies as of 12/03/2022       Reactions   Shellfish Allergy Anaphylaxis, Hives        Medication List     STOP taking these medications    omeprazole 20 MG capsule Commonly known as: PRILOSEC       TAKE these medications    acetaminophen 500 MG tablet Commonly known as: TYLENOL Take 1,000 mg by mouth 2 (two) times daily as needed for mild pain or headache.   apixaban 5 MG Tabs tablet Commonly known as: ELIQUIS Take 1 tablet (5 mg total) by mouth 2 (two) times daily.   dapagliflozin propanediol 5 MG Tabs tablet Commonly known as: Farxiga Take 2 tablets (10 mg total) by mouth daily. What changed: medication strength   furosemide 40 MG tablet Commonly known as: LASIX Take 1 tablet (40 mg total) by mouth daily.   losartan 25 MG tablet Commonly known as: COZAAR Take 1 tablet (25 mg total) by mouth daily. Start taking on: December 04, 2022   metoprolol succinate 25 MG 24 hr tablet Commonly known as: TOPROL-XL Take 0.5 tablets (12.5 mg total) by mouth daily. Start taking on: December 04, 2022   potassium chloride SA 20 MEQ tablet Commonly known as: KLOR-CON M Take 1 tablet (20 mEq total) by mouth daily.   spironolactone 25 MG tablet Commonly known as: ALDACTONE Take 1/2 tablet (12.5 mg total) by mouth daily.        Follow-up Information     [facility] Follow up.   Specialty: Cardiology Why: Advanced [facility] Clinic Follow-up - Tuesday Dec 15, 2022 at 12:00 PM. Please arrive at 11:45 AM to check in. Contact information: [address]  [address] [place] [address] [phone]               Allergies  Allergen Reactions   Shellfish Allergy Anaphylaxis and Hives    Consultations: Cardiology   Procedures/Studies: MR CARDIAC MORPHOLOGY W WO  CONTRAST  Result Date: 12/02/2022 CLINICAL DATA:  Chest pain/anginal equiv, ECGs or troponin abnormal COMPARISON: Echocardiogram 11/27/22 EXAM: MR CARDIA MORPHOLOGY WITHOUT AND WITH CONTRAST; MR CARDIAC VELOCITY FLOW MAPPING TECHNIQUE: The patient was scanned on a 1.5 Tesla Siemens magnet. A dedicated cardiac coil was used. Functional imaging was done using TrueFisp sequences. 2,3, and 4 chamber views were done to assess for RWMA's. Modified Simpson's rule using a short axis stack was used to calculate an ejection fraction on a dedicated work [employer]. The patient received 10mL GADAVIST GADOBUTROL 1 MMOL/ML IV SOLN. After 10 minutes inversion recovery sequences were used to assess for infiltration and scar tissue. Phase contrast velocity encoded images obtained x 2. This examination is tailored for evaluation cardiac anatomy and function and provides very limited assessment of noncardiac structures, which are accordingly not evaluated during interpretation. If there is clinical concern for extracardiac pathology, further evaluation with CT imaging should be considered. FINDINGS: LEFT VENTRICLE: Severely dilated left ventricular chamber size. Normal left ventricular wall thickness. Severely reduced left ventricular systolic function. LVEF = 13% Severe global hypokinesis Elevated T2 values in the inferolateral wall suggestive of myocardial edema, 57-67 msec. Global T2 = 54 msec Abnormal first pass perfusion: Subendocardial perfusion defect in the mid-apical inferolateral wall, consistent with infarct. There is post contrast delayed myocardial enhancement: Transmural LGE in the inferolateral wall from mid ventricle to apex. This finding most consistent with infarct, with no viability of this area of myocardium. Normal T1 myocardial nulling kinetics suggest against a diagnosis of cardiac amyloidosis. ECV = 32%, nonspecific elevation. On long TI images, there is concern for inferoapical very small  thrombus (series 75/15 and 76/15). RIGHT VENTRICLE: Normal right ventricular chamber size. Normal right ventricular wall thickness. Severely reduced right ventricular systolic function. RVEF = 14% Global hypokinesis with best preserved function at the annulus. No post contrast delayed myocardial enhancement. ATRIA: Left atrium: Severely dilated Right atrium: Mildly dilated VALVES: Tricuspid aortic valve. PERICARDIUM: Normal pericardium.  Trivial circumferential pericardial effusion. OTHER: No significant extracardiac findings. MEASUREMENTS: Qp/Qs: 1.06 Aortic valve regurgitation: Mild, regurgitant fraction 13% Pulmonary valve regurgitation: Mild, regurgitant fraction 11% Mitral valve regurgitation: Moderate-severe, regurgitant fraction 40% Tricuspid valve regurgitation: Moderate, regurgitant fraction 22% Left ventricle: LV female LV EF: 13 % (Normal 52-79%) Absolute volumes: LV EDV: (Normal 78-167 mL) LV ESV: (Normal 21-64 mL) LV SV: 50mL (Normal 52-114 mL) CO: 4.95L/min (Normal 2.7-6.3 L/min) Indexed volumes: LV EDV: 241mL/sq-m (normal 50-96 mL/sq-m) LV ESV: 159mL/sq-m (Normal 10-40 mL/sq-m) LV SV: 76mL/sq-m (Normal 33-64 mL/sq-m) CI: 2.67L/min/sq-m (normal 1.9-3.9 L/min/sq-m) Right ventricle: RV female RV EF: 14% (normal 52-80%) Absolute volumes: RV EDV: (Normal 79-175 mL) RV ESV: (Normal 13-75 mL) RV SV: 24mL (Normal 56-110 mL) CO: 2.4L/min (Normal 2.7-6 L/min) Indexed volumes: RV EDV: 43mL/sq-m (Normal 51-97 mL/sq-m) RV ESV: 42mL/sq-m (Normal 9-42 mL/sq-m) RV SV: 66mL/sq-m (Normal 35-61 mL/sq-m) CI: 1.29L/min/sq-m (Normal 1.8-3.8 L/min/sq-m) IMPRESSION: 1. Severe left ventricular dilation with severely reduced left ventricular systolic function, LVEF 13%. 2. Normal right ventricular size with severely reduced RV systolic function, RVEF 14%. 3. Transmural delayed myocardial enhancement in the LV mid-apical inferolateral wall with elevated T2 values and concordant first pass perfusion  defect, consistent with acute myocardial infarction.  4. On long inversion time images, there is concern for inferoapical very small thrombus (series 75/15 and 76/15). 5.  Moderate-severe mitral valve regurgitation, RF 40%. 6.  Moderate tricuspid valve regurgitation, RF 22%. Electronically Signed   By: Weston Brass M.D.   On: 12/02/2022 08:28   MR CARDIAC VELOCITY FLOW MAP  Result Date: 12/02/2022 CLINICAL DATA:  Chest pain/anginal equiv, ECGs or troponin abnormal COMPARISON: Echocardiogram 11/27/22 EXAM: MR CARDIA MORPHOLOGY WITHOUT AND WITH CONTRAST; MR CARDIAC VELOCITY FLOW MAPPING TECHNIQUE: The patient was scanned on a 1.5 Tesla Siemens magnet. A dedicated cardiac coil was used. Functional imaging was done using TrueFisp sequences. 2,3, and 4 chamber views were done to assess for RWMA's. Modified Simpson's rule using a short axis stack was used to calculate an ejection fraction on a dedicated work [employer]. The patient received 10mL GADAVIST GADOBUTROL 1 MMOL/ML IV SOLN. After 10 minutes inversion recovery sequences were used to assess for infiltration and scar tissue. Phase contrast velocity encoded images obtained x 2. This examination is tailored for evaluation cardiac anatomy and function and provides very limited assessment of noncardiac structures, which are accordingly not evaluated during interpretation. If there is clinical concern for extracardiac pathology, further evaluation with CT imaging should be considered. FINDINGS: LEFT VENTRICLE: Severely dilated left ventricular chamber size. Normal left ventricular wall thickness. Severely reduced left ventricular systolic function. LVEF = 13% Severe global hypokinesis Elevated T2 values in the inferolateral wall suggestive of myocardial edema, 57-67 msec. Global T2 = 54 msec Abnormal first pass perfusion: Subendocardial perfusion defect in the mid-apical inferolateral wall, consistent with infarct. There is post contrast delayed  myocardial enhancement: Transmural LGE in the inferolateral wall from mid ventricle to apex. This finding most consistent with infarct, with no viability of this area of myocardium. Normal T1 myocardial nulling kinetics suggest against a diagnosis of cardiac amyloidosis. ECV = 32%, nonspecific elevation. On long TI images, there is concern for inferoapical very small thrombus (series 75/15 and 76/15). RIGHT VENTRICLE: Normal right ventricular chamber size. Normal right ventricular wall thickness. Severely reduced right ventricular systolic function. RVEF = 14% Global hypokinesis with best preserved function at the annulus. No post contrast delayed myocardial enhancement. ATRIA: Left atrium: Severely dilated Right atrium: Mildly dilated VALVES: Tricuspid aortic valve. PERICARDIUM: Normal pericardium.  Trivial circumferential pericardial effusion. OTHER: No significant extracardiac findings. MEASUREMENTS: Qp/Qs: 1.06 Aortic valve regurgitation: Mild, regurgitant fraction 13% Pulmonary valve regurgitation: Mild, regurgitant fraction 11% Mitral valve regurgitation: Moderate-severe, regurgitant fraction 40% Tricuspid valve regurgitation: Moderate, regurgitant fraction 22% Left ventricle: LV female LV EF: 13 % (Normal 52-79%) Absolute volumes: LV EDV: (Normal 78-167 mL) LV ESV: (Normal 21-64 mL) LV SV: 50mL (Normal 52-114 mL) CO: 4.95L/min (Normal 2.7-6.3 L/min) Indexed volumes: LV EDV: 233mL/sq-m (normal 50-96 mL/sq-m) LV ESV: 172mL/sq-m (Normal 10-40 mL/sq-m) LV SV: 83mL/sq-m (Normal 33-64 mL/sq-m) CI: 2.67L/min/sq-m (normal 1.9-3.9 L/min/sq-m) Right ventricle: RV female RV EF: 14% (normal 52-80%) Absolute volumes: RV EDV: (Normal 79-175 mL) RV ESV: (Normal 13-75 mL) RV SV: 24mL (Normal 56-110 mL) CO: 2.4L/min (Normal 2.7-6 L/min) Indexed volumes: RV EDV: 46mL/sq-m (Normal 51-97 mL/sq-m) RV ESV: 36mL/sq-m (Normal 9-42 mL/sq-m) RV SV: 59mL/sq-m (Normal 35-61 mL/sq-m) CI: 1.29L/min/sq-m (Normal  1.8-3.8 L/min/sq-m) IMPRESSION: 1. Severe left ventricular dilation with severely reduced left ventricular systolic function, LVEF 13%. 2. Normal right ventricular size with severely reduced RV systolic function, RVEF 14%. 3. Transmural delayed myocardial enhancement in the LV mid-apical inferolateral wall with elevated T2 values and concordant  first pass perfusion defect, consistent with acute myocardial infarction. 4. On long inversion time images, there is concern for inferoapical very small thrombus (series 75/15 and 76/15). 5.  Moderate-severe mitral valve regurgitation, RF 40%. 6.  Moderate tricuspid valve regurgitation, RF 22%. Electronically Signed   By: Weston Brass M.D.   On: 12/02/2022 08:28   MR CARDIAC VELOCITY FLOW MAP  Result Date: 12/02/2022 CLINICAL DATA:  Chest pain/anginal equiv, ECGs or troponin abnormal COMPARISON: Echocardiogram 11/27/22 EXAM: MR CARDIA MORPHOLOGY WITHOUT AND WITH CONTRAST; MR CARDIAC VELOCITY FLOW MAPPING TECHNIQUE: The patient was scanned on a 1.5 Tesla Siemens magnet. A dedicated cardiac coil was used. Functional imaging was done using TrueFisp sequences. 2,3, and 4 chamber views were done to assess for RWMA's. Modified Simpson's rule using a short axis stack was used to calculate an ejection fraction on a dedicated work [employer], [employer]. The patient received 10mL GADAVIST GADOBUTROL 1 MMOL/ML IV SOLN. After 10 minutes inversion recovery sequences were used to assess for infiltration and scar tissue. Phase contrast velocity encoded images obtained x 2. This examination is tailored for evaluation cardiac anatomy and function and provides very limited assessment of noncardiac structures, which are accordingly not evaluated during interpretation. If there is clinical concern for extracardiac pathology, further evaluation with CT imaging should be considered. FINDINGS: LEFT VENTRICLE: Severely dilated left ventricular chamber size. Normal left ventricular  wall thickness. Severely reduced left ventricular systolic function. LVEF = 13% Severe global hypokinesis Elevated T2 values in the inferolateral wall suggestive of myocardial edema, 57-67 msec. Global T2 = 54 msec Abnormal first pass perfusion: Subendocardial perfusion defect in the mid-apical inferolateral wall, consistent with infarct. There is post contrast delayed myocardial enhancement: Transmural LGE in the inferolateral wall from mid ventricle to apex. This finding most consistent with infarct, with no viability of this area of myocardium. Normal T1 myocardial nulling kinetics suggest against a diagnosis of cardiac amyloidosis. ECV = 32%, nonspecific elevation. On long TI images, there is concern for inferoapical very small thrombus (series 75/15 and 76/15). RIGHT VENTRICLE: Normal right ventricular chamber size. Normal right ventricular wall thickness. Severely reduced right ventricular systolic function. RVEF = 14% Global hypokinesis with best preserved function at the annulus. No post contrast delayed myocardial enhancement. ATRIA: Left atrium: Severely dilated Right atrium: Mildly dilated VALVES: Tricuspid aortic valve. PERICARDIUM: Normal pericardium.  Trivial circumferential pericardial effusion. OTHER: No significant extracardiac findings. MEASUREMENTS: Qp/Qs: 1.06 Aortic valve regurgitation: Mild, regurgitant fraction 13% Pulmonary valve regurgitation: Mild, regurgitant fraction 11% Mitral valve regurgitation: Moderate-severe, regurgitant fraction 40% Tricuspid valve regurgitation: Moderate, regurgitant fraction 22% Left ventricle: LV female LV EF: 13 % (Normal 52-79%) Absolute volumes: LV EDV: (Normal 78-167 mL) LV ESV: (Normal 21-64 mL) LV SV: 50mL (Normal 52-114 mL) CO: 4.95L/min (Normal 2.7-6.3 L/min) Indexed volumes: LV EDV: 2101mL/sq-m (normal 50-96 mL/sq-m) LV ESV: 151mL/sq-m (Normal 10-40 mL/sq-m) LV SV: 63mL/sq-m (Normal 33-64 mL/sq-m) CI: 2.67L/min/sq-m (normal 1.9-3.9  L/min/sq-m) Right ventricle: RV female RV EF: 14% (normal 52-80%) Absolute volumes: RV EDV: (Normal 79-175 mL) RV ESV: (Normal 13-75 mL) RV SV: 24mL (Normal 56-110 mL) CO: 2.4L/min (Normal 2.7-6 L/min) Indexed volumes: RV EDV: 74mL/sq-m (Normal 51-97 mL/sq-m) RV ESV: 4mL/sq-m (Normal 9-42 mL/sq-m) RV SV: 56mL/sq-m (Normal 35-61 mL/sq-m) CI: 1.29L/min/sq-m (Normal 1.8-3.8 L/min/sq-m) IMPRESSION: 1. Severe left ventricular dilation with severely reduced left ventricular systolic function, LVEF 13%. 2. Normal right ventricular size with severely reduced RV systolic function, RVEF 14%. 3. Transmural delayed myocardial enhancement in the LV  mid-apical inferolateral wall with elevated T2 values and concordant first pass perfusion defect, consistent with acute myocardial infarction. 4. On long inversion time images, there is concern for inferoapical very small thrombus (series 75/15 and 76/15). 5.  Moderate-severe mitral valve regurgitation, RF 40%. 6.  Moderate tricuspid valve regurgitation, RF 22%. Electronically Signed   By: Weston Brass M.D.   On: 12/02/2022 08:28   CARDIAC CATHETERIZATION  Result Date: 11/30/2022 Images from the original result were not included. Zenda Dunagan is a 47 y.o. female  [id number] LOCATION:  FACILITY: [facility] PHYSICIAN: Nanetta Batty, M.D. [birth year] DATE OF PROCEDURE:  11/30/2022 DATE OF DISCHARGE: CARDIAC CATHETERIZATION History obtained from chart review.47 y.o. female history of HTN, prior PE, HFrEF, presented with left sided flank pain .  Her troponins went up to greater than 24,000.  Her 2D echo revealed severe LV dysfunction with an EF in the 15% range and LV dilatation.  She presents now for right left heart cath to define her anatomy and physiology. HEMODYNAMICS:  1: Right atrial pressure-11/5 2: Right ventricular pressure-37/2 3: Pulmonary artery pressure-40/20, mean 27 4: Pulmonary wedge pressure-A-wave 30, V wave 34, mean 26 5: LVEDP-20 6: Cardiac output-4.49  L/min with an index of 2.59 L/min/m   Ms. Balderston has clean coronary arteries and a "nonischemic cardiomyopathy".  Her wedge is elevated as is her right atrial pressure.  She will need guideline directed optimal medical therapy, continue diuresis, and treatment with oral anticoagulation for her "LV mural thrombus".  The radial sheath was removed and a TR band was placed on the right wrist to achieve patent hemostasis.  The right heart cath and sheath were removed as well and a pressure dressing was applied.  IV heparin will be restarted 4 hours after sheath removal.  She left the lab in stable condition. Nanetta Batty. MD, [facility] 11/30/2022 9:52 AM    ECHOCARDIOGRAM COMPLETE  Result Date: 11/27/2022    ECHOCARDIOGRAM REPORT   Patient Name:   Alexandria Rhodes Date of Exam: 11/27/2022 Medical Rec #:  [id number]      Height:       61.0 in Accession #:    [id number]     Weight:       162.0 lb Date of Birth:  [birth year]       BSA:          1.727 m Patient Age:    47 years       BP:           128/70 mmHg Patient Gender: F              HR:           81 bpm. Exam Location:  Inpatient Procedure: 2D Echo, Color Doppler, Cardiac Doppler and Intracardiac            Opacification Agent Indications:    I42.9 Cardiomyopathy (unspecified)  History:        Patient has prior history of Echocardiogram examinations, most                 recent 07/11/2021. CHF; Risk Factors:Hypertension.  Sonographer:    Irving Burton Senior RDCS Referring Phys: [id number] [address] Stade IMPRESSIONS  1. Left ventricular ejection fraction, by estimation, is 15%. The left ventricle has severely decreased function. The left ventricle demonstrates global hypokinesis. The left ventricular internal cavity size was severely dilated. Left ventricular diastolic parameters are indeterminate.  2. Right ventricular systolic function is mildly reduced. The right ventricular size is normal. Tricuspid  regurgitation signal is inadequate for assessing PA pressure.  3. Left atrial size  was severely dilated.  4. The mitral valve is grossly normal. Moderate mitral valve regurgitation. No evidence of mitral stenosis.  5. The aortic valve is tricuspid. Aortic valve regurgitation is trivial. No aortic stenosis is present.  6. The inferior vena cava is dilated in size with <50% respiratory variability, suggesting right atrial pressure of 15 mmHg. Conclusion(s)/Recommendation(s): No left ventricular mural or apical thrombus/thrombi. FINDINGS  Left Ventricle: Left ventricular ejection fraction, by estimation, is 15%. The left ventricle has severely decreased function. The left ventricle demonstrates global hypokinesis. Definity contrast agent was given IV to delineate the left ventricular endocardial borders. The left ventricular internal cavity size was severely dilated. There is no left ventricular hypertrophy. Left ventricular diastolic parameters are indeterminate. Right Ventricle: The right ventricular size is normal. No increase in right ventricular wall thickness. Right ventricular systolic function is mildly reduced. Tricuspid regurgitation signal is inadequate for assessing PA pressure. Left Atrium: Left atrial size was severely dilated. Right Atrium: Right atrial size was normal in size. Pericardium: Trivial pericardial effusion is present. Mitral Valve: The mitral valve is grossly normal. Moderate mitral valve regurgitation. No evidence of mitral valve stenosis. Tricuspid Valve: The tricuspid valve is normal in structure. Tricuspid valve regurgitation is mild . No evidence of tricuspid stenosis. Aortic Valve: The aortic valve is tricuspid. Aortic valve regurgitation is trivial. No aortic stenosis is present. Pulmonic Valve: The pulmonic valve was normal in structure. Pulmonic valve regurgitation is mild. No evidence of pulmonic stenosis. Aorta: The aortic root and ascending aorta are structurally normal, with no evidence of dilitation. Venous: The inferior vena cava is dilated in size with less  than 50% respiratory variability, suggesting right atrial pressure of 15 mmHg. IAS/Shunts: No atrial level shunt detected by color flow Doppler.  LEFT VENTRICLE PLAX 2D LVIDd:         7.00 cm LVIDs:         6.60 cm LV PW:         0.80 cm LV IVS:        0.50 cm LVOT diam:     1.90 cm LV SV:         29 LV SV Index:   17 LVOT Area:     2.84 cm  RIGHT VENTRICLE RV S prime:     10.40 cm/s TAPSE (M-mode): 1.2 cm LEFT ATRIUM              Index        RIGHT ATRIUM           Index LA diam:        4.40 cm  2.55 cm/m   RA Area:     16.10 cm LA Vol (A2C):   124.0 ml 71.80 ml/m  RA Volume:   42.20 ml  24.44 ml/m LA Vol (A4C):   113.0 ml 65.43 ml/m LA Biplane Vol: 122.0 ml 70.64 ml/m  AORTIC VALVE LVOT Vmax:   83.60 cm/s LVOT Vmean:  60.200 cm/s LVOT VTI:    0.101 m  AORTA Ao Root diam: 2.80 cm Ao Asc diam:  3.50 cm MITRAL VALVE MV Area (PHT): 3.27 cm       SHUNTS MV Decel Time: 232 msec       Systemic VTI:  0.10 m MR Peak grad:    55.7 mmHg    Systemic Diam: 1.90 cm MR Mean grad:    40.0 mmHg MR Vmax:  373.00 cm/s MR Vmean:        305.0 cm/s MR PISA:         3.08 cm MR PISA Eff ROA: 32 mm MR PISA Radius:  0.70 cm MV E velocity: 113.00 cm/s Weston Brass MD Electronically signed by Weston Brass MD Signature Date/Time: 11/27/2022/12:17:11 PM    Final    CT ABDOMEN PELVIS W CONTRAST  Addendum Date: 11/27/2022   ADDENDUM REPORT: 11/27/2022 07:10 ADDENDUM: Critical Value/emergent results were called by telephone at the time of interpretation on 11/27/2022 at 7:10 am to provider [facility] , who verbally acknowledged these results. Electronically Signed   By: Kennith Center M.D.   On: 11/27/2022 07:10   Result Date: 11/27/2022 CLINICAL DATA:  Abdominal and flank pain.  Kidney stones suspected. EXAM: CT ABDOMEN AND PELVIS WITH CONTRAST TECHNIQUE: Multidetector CT imaging of the abdomen and pelvis was performed using the standard protocol following bolus administration of intravenous contrast. RADIATION DOSE  REDUCTION: This exam was performed according to the departmental dose-optimization program which includes automated exposure control, adjustment of the mA and/or kV according to patient size and/or use of iterative reconstruction technique. CONTRAST:  75mL OMNIPAQUE IOHEXOL 350 MG/ML SOLN COMPARISON:  09/02/2022 FINDINGS: Lower chest: Heart is enlarged. There is plaque-like hypodensity along the endocardial surface towards the left ventricular apex (see axial image 13/3 and coronal 50/6) raising the question of some adherent thrombus in the left ventricle. Hepatobiliary: No suspicious focal abnormality within the liver parenchyma. There is no evidence for gallstones, gallbladder wall thickening, or pericholecystic fluid. No intrahepatic or extrahepatic biliary dilation. Pancreas: No focal mass lesion. Diffuse distention of the main duct again noted measuring up to 4-5 mm, similar to prior. No intraparenchymal cyst. No peripancreatic edema. Spleen: No splenomegaly. 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 Adrenals/Urinary Tract: No adrenal nodule or mass. Right kidney unremarkable. Segmental hypoperfusion identified in the upper interpolar left kidney (axial 25/3) compatible with infarct or pyelonephritis. No evidence for hydroureter. The urinary bladder appears normal for the degree of distention. Stomach/Bowel: Stomach is unremarkable. No gastric wall thickening. No evidence of outlet obstruction. Duodenum is normally positioned as is the ligament of Treitz. No small bowel wall thickening. No small bowel dilatation. The terminal ileum is normal. The appendix is best seen on coronal images and is unremarkable. No gross colonic mass. No colonic wall thickening. Vascular/Lymphatic: No abdominal aortic aneurysm. No abdominal aortic atherosclerotic calcification. There is no gastrohepatic or hepatoduodenal ligament lymphadenopathy. No  retroperitoneal or mesenteric lymphadenopathy. No pelvic sidewall lymphadenopathy. Reproductive: There is no adnexal mass. Other: No intraperitoneal free fluid. Musculoskeletal: No worrisome lytic or sclerotic osseous abnormality. IMPRESSION: 1. Segmental hypoperfusion in the upper interpolar left kidney compatible with infarct or pyelonephritis. 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 2. 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 3. Cardiomegaly with plaque-like hypodensity along the endocardial surface towards the left ventricular apex raising concern for adherent thrombus in the left ventricle. Cardiology consultation and echocardiogram recommended to further evaluate. Electronically Signed: By: Kennith Center M.D. On: 11/27/2022 07:07     Subjective: No acute issues/events overnight   Discharge Exam: Vitals:   12/03/22 0355 12/03/22 1213  BP: 113/81 100/76  Pulse: 83 91  Resp: 20 16  Temp: 98 F (36.7 C) 97.9 F (36.6 C)  SpO2: 100% 99%   Vitals:   12/02/22 1512 12/02/22 1924 12/03/22 0355 12/03/22 1213  BP: (!) 115/94 111/81 113/81 100/76  Pulse: 78 90 83 91  Resp: 16 18 20 16   Temp: 97.6 F (36.4 C) 97.6 F (36.4 C) 98 F (36.7 C) 97.9 F (36.6 C)  TempSrc: Oral Oral Oral Oral  SpO2: 100% 98% 100% 99%  Weight:   73.5 kg   Height:        General: Pt is alert, awake, not in acute distress Cardiovascular: RRR, S1/S2 +, no rubs, no gallops Respiratory: CTA bilaterally, no wheezing, no rhonchi Abdominal: Soft, NT, ND, bowel sounds + Extremities: no edema, no cyanosis    The results of significant diagnostics from this hospitalization (including imaging, microbiology, ancillary and laboratory) are listed  below for reference.     Microbiology: No results found for this or any previous visit (from the past 240 hour(s)).   Labs: BNP (last 3 results) Recent Labs    04/19/22 1614 04/28/22 1525 11/27/22 1529  BNP 1,171.4* 283.0* 1,684.1*   Basic Metabolic Panel: Recent Labs  Lab 11/29/22 0601 11/30/22 0412 11/30/22 0936 11/30/22 0938 11/30/22 0939 12/01/22 0444 12/02/22 0639 12/03/22 0354  NA 136 135   < > 137 133* 133* 135 136  K 3.8 3.9   < > 3.8 3.7 4.5 4.2 4.0  CL 102 104  --   --   --  105 106 98  CO2 24 22  --   --   --  20* 21* 26  GLUCOSE 102* 102*  --   --   --  139* 93 98  BUN 13 17  --   --   --  18 17 18   CREATININE 0.87 0.95  --   --   --  0.79 0.92 0.89  CALCIUM 9.1 8.9  --   --   --  9.3 9.1 9.7  MG  --   --   --   --   --   --   --  2.2   < > = values in this interval not displayed.   Liver Function Tests: Recent Labs  Lab 11/26/22 2324 11/28/22 0432  AST 86* 76*  ALT 21 22  ALKPHOS 73 65  BILITOT 1.0 1.0  PROT 7.9 6.8  ALBUMIN 4.1 3.3*   No results for input(s): "LIPASE", "AMYLASE" in the last 168 hours. No results for input(s): "AMMONIA" in the last 168 hours. CBC: Recent Labs  Lab 11/26/22 2324 11/28/22 0432 11/29/22 0601 11/30/22 0412 11/30/22 0936 11/30/22 0938 11/30/22 0939 12/01/22 0444  WBC 7.6 7.8 6.4 6.0  --   --   --  6.3  NEUTROABS 5.5  --   --   --   --   --   --   --   HGB 12.5 11.8* 11.0* 11.1* 11.2* 11.6* 11.6* 10.8*  HCT 40.5 36.9 35.4* 34.4* 33.0* 34.0* 34.0* 35.0*  MCV 89.8 87.2 89.4 89.1  --   --   --  91.9  PLT 303 276 249 258  --   --   --  278   Cardiac Enzymes: No results for input(s): "CKTOTAL", "CKMB", "CKMBINDEX", "TROPONINI" in the last 168 hours. BNP: Invalid input(s): "POCBNP" CBG: Recent Labs  Lab 12/01/22 1005  GLUCAP 133*   D-Dimer No results for input(s): "DDIMER" in the  last 72 hours. Hgb A1c No results for input(s): "HGBA1C" in the last 72 hours. Lipid Profile No results for input(s):  "CHOL", "HDL", "LDLCALC", "TRIG", "CHOLHDL", "LDLDIRECT" in the last 72 hours. Thyroid function studies Recent Labs    12/03/22 0354  TSH 5.778*   Anemia work up No results for input(s): "VITAMINB12", "FOLATE", "FERRITIN", "TIBC", "IRON", "RETICCTPCT" in the last 72 hours. Urinalysis    Component Value Date/Time   COLORURINE YELLOW 11/26/2022 2327   APPEARANCEUR CLEAR 11/26/2022 2327   LABSPEC 1.022 11/26/2022 2327   PHURINE 5.0 11/26/2022 2327   GLUCOSEU >=500 (A) 11/26/2022 2327   HGBUR SMALL (A) 11/26/2022 2327   BILIRUBINUR NEGATIVE 11/26/2022 2327   BILIRUBINUR negative 06/06/2018 1329   KETONESUR 5 (A) 11/26/2022 2327   PROTEINUR NEGATIVE 11/26/2022 2327   UROBILINOGEN 0.2 06/06/2018 1329   NITRITE NEGATIVE 11/26/2022 2327   LEUKOCYTESUR NEGATIVE 11/26/2022 2327   Sepsis Labs Recent Labs  Lab 11/28/22 0432 11/29/22 0601 11/30/22 0412 12/01/22 0444  WBC 7.8 6.4 6.0 6.3   Microbiology No results found for this or any previous visit (from the past 240 hour(s)).   Time coordinating discharge: Over 30 minutes  SIGNED:   Azucena Fallen, DO [facility] 12/03/2022, 5:16 PM Pager   If 7PM-7AM, please contact night-coverage [email or web address]

## 2022-12-04 ENCOUNTER — Other Ambulatory Visit (HOSPITAL_COMMUNITY): Payer: Self-pay

## 2022-12-14 NOTE — Progress Notes (Signed)
ADVANCED HEART FAILURE CLINIC NOTE  PCP: Lorenda Ishihara, MD  HF Cardiologist: Dr Gala Romney   Reason for visit: Heart Failure   HPI: Alexandria Rhodes is a 47 y.o. female with a history of non-ischemic cardiomyopathy with EF as low as 10% in 2016 (had LifeVest) but EF 40-45% on Echo in 1/22, prior PE in 1/20 no longer on anticoagulation, migraine, and hypertension.  She was admitted to a hospital in [place] in 08/2014 after presenting with CP and SOB.  She was found to have an EF of 10%. She states she underwent heart cath which showed no CAD. She was followed by Dr. Antionette Char at [facility] in [place], [place].  Started on GDMT: Entresto 97/103, Coreg 6.25, spiro 12.5. She moved to [place] in 2019.  Admitted to [facility] in 03/2018 with CP and found to have an acute PE, felt to be provoked by recent move from [place]. Echo showed EF of 65-70% with normal wall motion, grade 1 DD, normal RV function, and dilated IVC.  She was started on Eliquis but was only able to complete 1 month of therapy due to cost.  She also could not afford her HF medications and has not been on anything x 1 year.  Admitted to [facility] on 5/21 with a/c CHF, out of her medications x 1 week. Diuresed with IV lasix, started back on GDMT, discharged home with weight 156 lbs. Given all medications from HF fund at discharge. Referred to HF social work to help w/ meds and transportation to and from appts.   cMRI 5/21 LVEF 29%, diffuse HK, paradoxical septal motion, midwall LGE, non-specific sign consistent with NICM, significant trabeculations in basal and mid myocardial segments.  08/2019 digoxin stopped due to rash and improved symptoms. She canceled all follow up appointments and did not reschedule.   Follow up 11/22 she had been out of her medicines x 8 months. Volume up, NYHA II-III. GDMT restarted & enrolled in Paramedicine.  Echo 11/22: EF 30-35%, LV moderately decreased, global HK, RV ok, mild to moderate MR  Echo 07/11/21: EF 25-30%  (read as 30-35%) moderate MR G2DD   Seen in ED 02/27/22 with CP. Hs troponin negative x 2, low suspicion for ACS. CXR and viral panel negative. Felt pain 2/2 to viral URI with cough. Seen in ED 04/06/22 with SOB, CXR concerning for PNA. Given abx/steroids and nebs. BNP > 1000 and instructed to take her lasix. Seen in ED 04/19/22 with flank pain, BNP elevated. CTA neg for PE. Lasix Rx refilled and discharged.   Follow up 2/24, last seen 06/2021. NYHA I-II and volume up a bit. [name] restarted. CPX 4/24 showed no significant HF limitation, body habitus and restrictive lung physiology contributory to perceived exercise intolerance.  Admitted 9/24 with L flank pain. 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 Started on Eliquis. Echo showed EF 15%. HsTroponin > 24K and she underwent R/LHC showing non obstructive CAD, elevated PCWP 27, CI 2.59. cMRI showed new scar, suspected embolization to coronary artery. She was discharged home, weight 161 lbs.  Today she returns for post hospital HF follow up. Overall feeling fine. Continues with flank pain. She is not SOB with activity or carrying groceries. Noticed some rectal bleeding with BMs. Denies palpitations, CP, dizziness, edema, or PND/Orthopnea. Appetite ok. No fever or chills. Weight at home 158 pounds. Taking all medications. She works full-time at [employer]. No tobacco/drugs, occasional ETOH.  Family Hx: sister early CAD, mother with CVA  Cardiac Studies: - cMRI (9/24): LVEF 13%, RVEF  14%, moderate to severe MR, moderate TR, inferolateral mid to apical infarct with no viability, LGE transmural suggestive of embolic infarct.   - R/LHC (9/24): no CAD; RA 4, PA 40/20 (27), PCWP 26, CO/CI (Fick) 4.49/2.59, LVEDP 20, PAPi 5  - Echo (9/24): EF 15%, mildly reduced RV, moderate MR  - CPX (4/24): Normal function capacity Peak VO2: 20.6 (88% predicted peak VO2)  VE/VCO2 slope:  32  OUES: 1.7  Peak RER: 1.09  Ventilatory Threshold: 16.4 (70% predicted or measured peak  VO2)  Peak RR 42  Peak Ventilation:  56.8  VE/MVV:  107% (FEV1 * 40 = 62.8)  PETCO2 at peak:  34  O2pulse:  10   (100% predicted O2pulse)   - Echo (4/23): EF 25-30% (read as 30-35%), moderate MR, G2DD  - Echo (11/22): EF 30-35%, LV moderately decreased, global HK, RV ok, mild to moderate MR  - Echo (1/22): EF 40-45%  - Echo (5/21): EF 20-25% Grade II DD  - cMRI (5/21): LVEF 29%, diffuse HK, paradoxical septal motion, midwall LGE, non-specific sign consistent with NICM, significant trabeculations in basal and mid myocardial segments.  - Echo (1/20): EF 65-70%   ROS: All systems negative except as listed in HPI, PMH and Problem List.  SH:  Social History   Socioeconomic History   Marital status: Single    Spouse name: Not on file   Number of children: Not on file   Years of education: Not on file   Highest education level: Not on file  Occupational History   Not on file  Tobacco Use   Smoking status: Former    Types: Cigarettes   Smokeless tobacco: Never  Vaping Use   Vaping status: Never Used  Substance and Sexual Activity   Alcohol use: Not Currently   Drug use: Not Currently   Sexual activity: Yes  Other Topics Concern   Not on file  Social History Narrative   Not on file   Social Determinants of Health   Financial Resource Strain: High Risk (06/13/2021)   Overall Financial Resource Strain (CARDIA)    Difficulty of Paying Living Expenses: Very hard  Food Insecurity: No Food Insecurity (11/27/2022)   Hunger Vital Sign    Worried About Running Out of Food in the Last Year: Never true    Ran Out of Food in the Last Year: Never true  Transportation Needs: No Transportation Needs (11/27/2022)   PRAPARE - Administrator, Civil Service (Medical): No    Lack of Transportation (Non-Medical): No  Physical Activity: Unknown (03/28/2018)   Exercise Vital Sign    Days of Exercise per Week: Patient declined    Minutes of Exercise per Session: Patient declined   Stress: No Stress Concern Present (03/28/2018)   [employer] of Occupational Health - Occupational Stress Questionnaire    Feeling of Stress : Not at all  Social Connections: Unknown (03/28/2018)   Social Connection and Isolation Panel [NHANES]    Frequency of Communication with Friends and Family: Patient declined    Frequency of Social Gatherings with Friends and Family: Patient declined    Attends Religious Services: Patient declined    Database administrator or Organizations: Patient declined    Attends Banker Meetings: Patient declined    Marital Status: Patient declined  Intimate Partner Violence: Not At Risk (11/27/2022)   Humiliation, Afraid, Rape, and Kick questionnaire    Fear of Current or Ex-Partner: No    Emotionally Abused: No  Physically Abused: No    Sexually Abused: No   FH:  Family History  Problem Relation Age of Onset   Hypertension Mother    Past Medical History:  Diagnosis Date   Acute pulmonary embolism (HCC)    Cardiomyopathy (HCC)    CHF (congestive heart failure) (HCC)    Hypertension    Non-ischemic cardiomyopathy (HCC) 07/2019   Current Outpatient Medications  Medication Sig Dispense Refill   acetaminophen (TYLENOL) 500 MG tablet Take 1,000 mg by mouth 2 (two) times daily as needed for mild pain or headache.     apixaban (ELIQUIS) 5 MG TABS tablet Take 1 tablet (5 mg total) by mouth 2 (two) times daily. 60 tablet 0   dapagliflozin propanediol (FARXIGA) 5 MG TABS tablet Take 2 tablets (10 mg total) by mouth daily. 60 tablet 0   furosemide (LASIX) 40 MG tablet Take 1 tablet (40 mg total) by mouth daily. 30 tablet 0   losartan (COZAAR) 25 MG tablet Take 1 tablet (25 mg total) by mouth daily. 30 tablet 0   metoprolol succinate (TOPROL-XL) 25 MG 24 hr tablet Take 0.5 tablets (12.5 mg total) by mouth daily. 15 tablet 0   potassium chloride SA (KLOR-CON M) 20 MEQ tablet Take 1 tablet (20 mEq total) by mouth daily. 30 tablet 3    spironolactone (ALDACTONE) 25 MG tablet Take 1/2 tablet (12.5 mg total) by mouth daily. 15 tablet 0   No current facility-administered medications for this encounter.   BP 96/80   Pulse 77   Wt 73.3 kg (161 lb 9.6 oz)   LMP 03/27/2014 (Approximate)   SpO2 100%   BMI 30.53 kg/m   Wt Readings from Last 3 Encounters:  12/15/22 73.3 kg (161 lb 9.6 oz)  12/03/22 73.5 kg (162 lb)  10/19/22 73 kg (160 lb 15 oz)   PHYSICAL EXAM: General:  NAD. No resp difficulty, walked into clinic, fatigued-appearing. HEENT: Normal Neck: Supple. No JVD. Carotids 2+ bilat; no bruits. No lymphadenopathy or thryomegaly appreciated. Cor: PMI nondisplaced. Brady rate & rhythm. No rubs, gallops, 2/6 SEM Lungs: Clear Abdomen: Soft, obese, nontender, nondistended. No hepatosplenomegaly. No bruits or masses. Good bowel sounds. Extremities: No cyanosis, clubbing, rash, edema Neuro: Alert & oriented x 3, cranial nerves grossly intact. Moves all 4 extremities w/o difficulty. Affect pleasant.  ECG (personally reviewed): NSR with 1AVB. W/ 1 PVC  ReDs: 30%  ASSESSMENT & PLAN:  1. 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  She was started on guideline based medications and EF improved to 65-70% on echo in 03/2018.   - Echo (5/21): EF back down to 20-25% (in the setting of poor med compliance) with global hypokinesis, grade II diastolic dysfunction, severely dilated left atrium, moderate MR - cMRI (5/21): LVEF 29% minimal LGE. + non-compaction. Severe LAE. Mod-severe MR. Normal RV - Echo (1/22): EF 40-45%  - Echo (11/22): EF 30-35% - Echo (4/23): EF 25-30% (read as 30-35%) moderate MR G2DD  - CPX (4/24): no HF limitation, slope 32 - Echo (9/24): EF 15%, mildly reduced RV, moderate MR - R/LHC (9/24): normal cors, elevated pressures, CO/CI (Fick) 4.49/2.59 - cMRI (9/24): LVEF 3%, RVEF 14%, moderate to severe MR, moderate TR,  inferolateral mid to apical infarct with no viability, LGE transmural suggestive of embolic infarct.  - Stable NYHA I-II. Volume status ok, REDs 30% - Continue Farxiga 10 mg daily.   -  Continue Lasix 40 mg daily + 20 KCL daily. - Continue spiro 12.5 mg daily. - Continue Toprol XL 12.5 mg daily. - Continue losartan 25 mg daily. BP too soft for Entresto - Off digoxin with rash - EF not improving. I worry she will be heading toward advanced therapies down the road. Dr. Gala Romney previously had a long talk about need for better med compliance.  - Need to consider ICD, but needs to demonstrate better compliance and follow up. - Labs today. - Repeat echo after GDMT titrated.  2. 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 - Will ned lifelong AC - CBC today.  3. Valvular disease - Moderate to severe MR, likely functional - Moderate TR - Follow on echo  4. Bradycardia - no evidence of infiltrative disease on cMRI - Zio (1/23): Sinus rhythm - avg HR of 84 bpm. Rare PACs and PVCs - She has home sleep study, needs to complete.  5. HTN - BP on the low side today - Meds as above.  6. History of PE - Provoked in the setting of prolonged travel.  - Now back on AC as above  7. Snoring - As above, needs to complete sleep study   8. SDOH - Discussed importance of med compliance and close follow up - Previously followed by paramedicine. - Engage HFSW to help with disability. I am not sure how much longer she will be able to work.  Follow up in 3 weeks with PharmD (consider switch to [facility]), 6 weeks with APP and 12 weeks with Dr. Gala Romney + echo.  Jacklynn Ganong, FNP  12:09 PM

## 2022-12-15 ENCOUNTER — Ambulatory Visit (HOSPITAL_COMMUNITY)
Admit: 2022-12-15 | Discharge: 2022-12-15 | Disposition: A | Discharge status: Home / Self Care | Payer: Managed Care, Other (non HMO) | Source: Ambulatory Visit | Attending: Family Medicine | Admitting: Family Medicine

## 2022-12-15 ENCOUNTER — Other Ambulatory Visit (HOSPITAL_COMMUNITY): Payer: Self-pay

## 2022-12-15 ENCOUNTER — Encounter (HOSPITAL_COMMUNITY): Payer: Self-pay

## 2022-12-15 VITALS — BP 96/80 | HR 77 | Wt 161.6 lb

## 2022-12-15 DIAGNOSIS — I361 Nonrheumatic tricuspid (valve) insufficiency: Secondary | ICD-10-CM | POA: Insufficient documentation

## 2022-12-15 DIAGNOSIS — I513 Intracardiac thrombosis, not elsewhere classified: Secondary | ICD-10-CM

## 2022-12-15 DIAGNOSIS — R0683 Snoring: Secondary | ICD-10-CM | POA: Insufficient documentation

## 2022-12-15 DIAGNOSIS — Z86711 Personal history of pulmonary embolism: Secondary | ICD-10-CM | POA: Insufficient documentation

## 2022-12-15 DIAGNOSIS — Z79899 Other long term (current) drug therapy: Secondary | ICD-10-CM | POA: Diagnosis not present

## 2022-12-15 DIAGNOSIS — I10 Essential (primary) hypertension: Secondary | ICD-10-CM

## 2022-12-15 DIAGNOSIS — I5022 Chronic systolic (congestive) heart failure: Secondary | ICD-10-CM | POA: Diagnosis not present

## 2022-12-15 DIAGNOSIS — Z562 Threat of job loss: Secondary | ICD-10-CM | POA: Diagnosis not present

## 2022-12-15 DIAGNOSIS — I428 Other cardiomyopathies: Secondary | ICD-10-CM | POA: Diagnosis present

## 2022-12-15 DIAGNOSIS — I110 Hypertensive heart disease with heart failure: Secondary | ICD-10-CM | POA: Diagnosis not present

## 2022-12-15 DIAGNOSIS — R9431 Abnormal electrocardiogram [ECG] [EKG]: Secondary | ICD-10-CM | POA: Diagnosis not present

## 2022-12-15 DIAGNOSIS — I340 Nonrheumatic mitral (valve) insufficiency: Secondary | ICD-10-CM | POA: Diagnosis not present

## 2022-12-15 DIAGNOSIS — Z86718 Personal history of other venous thrombosis and embolism: Secondary | ICD-10-CM | POA: Diagnosis not present

## 2022-12-15 DIAGNOSIS — R001 Bradycardia, unspecified: Secondary | ICD-10-CM | POA: Insufficient documentation

## 2022-12-15 DIAGNOSIS — I38 Endocarditis, valve unspecified: Secondary | ICD-10-CM

## 2022-12-15 DIAGNOSIS — I5042 Chronic combined systolic (congestive) and diastolic (congestive) heart failure: Secondary | ICD-10-CM | POA: Insufficient documentation

## 2022-12-15 DIAGNOSIS — Z139 Encounter for screening, unspecified: Secondary | ICD-10-CM

## 2022-12-15 LAB — CBC
HCT: 42.7 % (ref 36.0–46.0)
Hemoglobin: 13.5 g/dL (ref 12.0–15.0)
MCH: 28.5 pg (ref 26.0–34.0)
MCHC: 31.6 g/dL (ref 30.0–36.0)
MCV: 90.3 fL (ref 80.0–100.0)
Platelets: 326 10*3/uL (ref 150–400)
RBC: 4.73 MIL/uL (ref 3.87–5.11)
RDW: 15.5 % (ref 11.5–15.5)
WBC: 6 10*3/uL (ref 4.0–10.5)
nRBC: 0 % (ref 0.0–0.2)

## 2022-12-15 LAB — BASIC METABOLIC PANEL
Anion gap: 8 (ref 5–15)
BUN: 9 mg/dL (ref 6–20)
CO2: 24 mmol/L (ref 22–32)
Calcium: 9.8 mg/dL (ref 8.9–10.3)
Chloride: 107 mmol/L (ref 98–111)
Creatinine, Ser: 1.06 mg/dL — ABNORMAL HIGH (ref 0.44–1.00)
GFR, Estimated: >60 mL/min (ref >60)
Glucose, Bld: 97 mg/dL (ref 70–99)
Potassium: 4.4 mmol/L (ref 3.5–5.1)
Sodium: 139 mmol/L (ref 135–145)

## 2022-12-15 LAB — FERRITIN: Ferritin: 33 ng/mL (ref 11–307)

## 2022-12-15 LAB — IRON AND TIBC
Iron: 93 ug/dL (ref 28–170)
Saturation Ratios: 22 % (ref 10.4–31.8)
TIBC: 426 ug/dL (ref 250–450)
UIBC: 333 ug/dL

## 2022-12-15 LAB — BRAIN NATRIURETIC PEPTIDE: B Natriuretic Peptide: 603.3 pg/mL — ABNORMAL HIGH (ref 0.0–100.0)

## 2022-12-15 MED ORDER — METOPROLOL SUCCINATE ER 25 MG PO TB24
12.5000 mg | ORAL_TABLET | Freq: Every day | ORAL | 6 refills | Status: DC
Start: 2022-12-15 — End: 2023-01-19
  Filled 2022-12-15: qty 15, 30d supply, fill #0

## 2022-12-15 MED ORDER — DAPAGLIFLOZIN PROPANEDIOL 10 MG PO TABS
10.0000 mg | ORAL_TABLET | Freq: Every day | ORAL | 6 refills | Status: DC
Start: 2022-12-15 — End: 2023-01-19
  Filled 2022-12-15: qty 30, 30d supply, fill #0

## 2022-12-15 MED ORDER — POTASSIUM CHLORIDE CRYS ER 20 MEQ PO TBCR
20.0000 meq | EXTENDED_RELEASE_TABLET | Freq: Every day | ORAL | 6 refills | Status: DC
  Filled 2022-12-15: qty 30, 30d supply, fill #0

## 2022-12-15 MED ORDER — APIXABAN 5 MG PO TABS
5.0000 mg | ORAL_TABLET | Freq: Two times a day (BID) | ORAL | 6 refills | Status: DC
Start: 2022-12-15 — End: 2023-01-19
  Filled 2022-12-15: qty 60, 30d supply, fill #0

## 2022-12-15 MED ORDER — FUROSEMIDE 40 MG PO TABS
40.0000 mg | ORAL_TABLET | Freq: Every day | ORAL | 6 refills | Status: DC
Start: 2022-12-15 — End: 2023-01-19
  Filled 2022-12-15: qty 30, 30d supply, fill #0

## 2022-12-15 MED ORDER — LOSARTAN POTASSIUM 25 MG PO TABS
25.0000 mg | ORAL_TABLET | Freq: Every day | ORAL | 6 refills | Status: DC
Start: 2022-12-15 — End: 2023-01-19
  Filled 2022-12-15: qty 30, 30d supply, fill #0

## 2022-12-15 NOTE — Patient Instructions (Addendum)
Thank you for coming in today  If you had labs drawn today, any labs that are abnormal the clinic will call you No news is good news  Medications: [name] pill changed to 10 mg tablet from 5 mg tablet   Follow up appointments:  Your physician recommends that you schedule a follow-up appointment in:  3 weeks pharmacy 6 weeks in clinic   12 weeks With Dr. Gala Romney with echocardiogram  Your physician has requested that you have an echocardiogram. Echocardiography is a painless test that uses sound waves to create images of your heart. It provides your doctor with information about the size and shape of your heart and how well your heart's chambers and valves are working. This procedure takes approximately one hour. There are no restrictions for this procedure.   You will receive a reminder letter in the mail a few months in advance. If you don't receive a letter, please call our office to schedule the follow-up appointment.    Do the following things EVERYDAY: Weigh yourself in the morning before breakfast. Write it down and keep it in a log. Take your medicines as prescribed Eat low salt foods--Limit salt (sodium) to 2000 mg per day.  Stay as active as you can everyday Limit all fluids for the day to less than 2 liters   At the [facility], you and your health needs are our priority. As part of our continuing mission to provide you with exceptional heart care, we have created designated Provider Care Teams. These Care Teams include your primary Cardiologist (physician) and Advanced Practice Providers (APPs- Physician Assistants and Nurse Practitioners) who all work together to provide you with the care you need, when you need it.   You may see any of the following providers on your designated Care Team at your next follow up: Dr Arvilla Meres Dr Marca Ancona Dr. Marcos Eke, NP [name], [place] [facility] [place], [place] Brynda Peon, NP Karle Plumber, PharmD   Please be sure to bring in all your medications bottles to every appointment.    Thank you for choosing [facility]  If you have any questions or concerns before your next appointment please send [place] a message through [place] or call our office at [phone].    TO LEAVE A MESSAGE FOR THE NURSE SELECT OPTION 2, PLEASE LEAVE A MESSAGE INCLUDING: YOUR NAME DATE OF BIRTH CALL BACK NUMBER REASON FOR CALL**this is important as we prioritize the call backs  YOU WILL RECEIVE A CALL BACK THE SAME DAY AS LONG AS YOU CALL BEFORE 4:00 PM

## 2022-12-15 NOTE — Progress Notes (Signed)
ReDS Vest / Clip - 12/15/22 1100       ReDS Vest / Clip   Station Marker A    Ruler Value 26    ReDS Value Range Low volume    ReDS Actual Value 30

## 2022-12-15 NOTE — Progress Notes (Signed)
H&V Care Navigation CSW Progress Note  Clinical Social Worker consulted to speak with pt regarding disability.  CSW discussed with pt and assisted in referral to [facility] to help her apply.  She is concerned about finances as she lives with her sister and pays $1000/month to cover living expenses.  Worried she will lose housing without working full time.  Encouraged her to speak with her job about FMLA and any STD benefits she might be eligible for.  Discussed applying for Medicaid now that she has no income and food stamps.    Will continue to follow and assist as needed   SDOH Screenings   Food Insecurity: No Food Insecurity (11/27/2022)  Housing: Low Risk  (11/27/2022)  Transportation Needs: No Transportation Needs (11/27/2022)  Utilities: Not At Risk (11/27/2022)  Depression (PHQ2-9): Low Risk  (04/06/2018)  Financial Resource Strain: High Risk (06/13/2021)  Physical Activity: Unknown (03/28/2018)  Social Connections: Unknown (03/28/2018)  Stress: No Stress Concern Present (03/28/2018)  Tobacco Use: Medium Risk (11/27/2022)   Burna Sis, LCSW Clinical Social Worker [facility] Desk#: [phone] Cell#: [phone]

## 2022-12-24 ENCOUNTER — Emergency Department (HOSPITAL_COMMUNITY)
Admission: EM | Admit: 2022-12-24 | Discharge: 2022-12-24 | Disposition: A | Discharge status: Home / Self Care | Payer: Commercial Managed Care - HMO | Attending: Emergency Medicine | Admitting: Emergency Medicine

## 2022-12-24 ENCOUNTER — Other Ambulatory Visit: Payer: Self-pay

## 2022-12-24 ENCOUNTER — Encounter (HOSPITAL_COMMUNITY): Payer: Self-pay

## 2022-12-24 ENCOUNTER — Other Ambulatory Visit (HOSPITAL_COMMUNITY): Payer: Self-pay

## 2022-12-24 ENCOUNTER — Emergency Department (HOSPITAL_COMMUNITY): Payer: Commercial Managed Care - HMO

## 2022-12-24 DIAGNOSIS — W010XXA Fall on same level from slipping, tripping and stumbling without subsequent striking against object, initial encounter: Secondary | ICD-10-CM | POA: Diagnosis not present

## 2022-12-24 DIAGNOSIS — Z7901 Long term (current) use of anticoagulants: Secondary | ICD-10-CM | POA: Insufficient documentation

## 2022-12-24 DIAGNOSIS — Y990 Civilian activity done for income or pay: Secondary | ICD-10-CM | POA: Insufficient documentation

## 2022-12-24 DIAGNOSIS — S300XXA Contusion of lower back and pelvis, initial encounter: Secondary | ICD-10-CM | POA: Insufficient documentation

## 2022-12-24 MED ORDER — ACETAMINOPHEN 500 MG PO TABS
1000.0000 mg | ORAL_TABLET | Freq: Once | ORAL | Status: AC
  Administered 2022-12-24: 1000 mg via ORAL
  Filled 2022-12-24: qty 2

## 2022-12-24 MED ORDER — IBUPROFEN 800 MG PO TABS: 800.0000 mg | ORAL_TABLET | Freq: Once | ORAL | Status: DC

## 2022-12-24 MED ORDER — HYDROCODONE-ACETAMINOPHEN 5-325 MG PO TABS: 1.0000 | ORAL_TABLET | Freq: Four times a day (QID) | ORAL | 0 refills | Status: DC | PRN

## 2022-12-24 MED ORDER — HYDROCODONE-ACETAMINOPHEN 5-325 MG PO TABS
1.0000 | ORAL_TABLET | Freq: Four times a day (QID) | ORAL | 0 refills | Status: DC | PRN
Start: 2022-12-24 — End: 2023-01-19
  Filled 2022-12-24: qty 10, 2d supply, fill #0

## 2022-12-24 NOTE — Discharge Instructions (Signed)
Get help right away if:  You have severe pain.  You have numbness in a hand or foot.  Your hand or foot turns pale or cold.

## 2022-12-24 NOTE — ED Provider Notes (Signed)
[facility] AT [facility] Provider Note   CSN: [id number] Arrival date & time: 12/24/22  5701     History  Chief Complaint  Patient presents with   Fall   Hip Pain    Alexandria Rhodes is a 47 y.o. female.  Who presents emergency department chief complaint of left buttock pain.  Patient fell onto her bottom 1 day ago at work.  She is on Eliquis for history of pulmonary embolus.  She complains of bruising to her left bottom.  She has been ambulatory but states it is very painful for her to sit on that side.  She denies numbness tingling weakness or inability to ambulate.  Did not hit her head or lose consciousness  Fall  Hip Pain       Home Medications Prior to Admission medications   Medication Sig Start Date End Date Taking? Authorizing Provider  acetaminophen (TYLENOL) 500 MG tablet Take 1,000 mg by mouth 2 (two) times daily as needed for mild pain or headache.    [provider]  apixaban (ELIQUIS) 5 MG TABS tablet Take 1 tablet (5 mg total) by mouth 2 (two) times daily. 12/15/22   Milford, Anderson Malta, FNP  dapagliflozin propanediol (FARXIGA) 10 MG TABS tablet Take 1 tablet (10 mg total) by mouth daily before breakfast. 12/15/22   Milford, Anderson Malta, FNP  furosemide (LASIX) 40 MG tablet Take 1 tablet (40 mg total) by mouth daily. 12/15/22   Milford, Anderson Malta, FNP  losartan (COZAAR) 25 MG tablet Take 1 tablet (25 mg total) by mouth daily. 12/15/22   Milford, Anderson Malta, FNP  metoprolol succinate (TOPROL-XL) 25 MG 24 hr tablet Take 0.5 tablets (12.5 mg total) by mouth daily. 12/15/22   Milford, Anderson Malta, FNP  potassium chloride SA (KLOR-CON M) 20 MEQ tablet Take 1 tablet (20 mEq total) by mouth daily. 12/15/22   Milford, Anderson Malta, FNP  spironolactone (ALDACTONE) 25 MG tablet Take 1/2 tablet (12.5 mg total) by mouth daily. 12/03/22   Azucena Fallen, MD      Allergies    Shellfish allergy    Review of Systems   Review of  Systems  Physical Exam Updated Vital Signs BP 97/64   Pulse 62   Temp 97.8 F (36.6 C) (Oral)   Resp 16   Ht 5\' 1"  (1.549 m)   Wt 72.6 kg   LMP 03/27/2014 (Approximate)   SpO2 100%   BMI 30.23 kg/m  Physical Exam Vitals and nursing note reviewed.  Constitutional:      General: She is not in acute distress.    Appearance: She is well-developed. She is not diaphoretic.  HENT:     Head: Normocephalic and atraumatic.     Right Ear: External ear normal.     Left Ear: External ear normal.     Nose: Nose normal.     Mouth/Throat:     Mouth: Mucous membranes are moist.  Eyes:     General: No scleral icterus.    Conjunctiva/sclera: Conjunctivae normal.  Cardiovascular:     Rate and Rhythm: Normal rate and regular rhythm.     Heart sounds: Normal heart sounds. No murmur heard.    No friction rub. No gallop.  Pulmonary:     Effort: Pulmonary effort is normal. No respiratory distress.     Breath sounds: Normal breath sounds.  Abdominal:     General: Bowel sounds are normal. There is no distension.     Palpations:  Abdomen is soft. There is no mass.     Tenderness: There is no abdominal tenderness. There is no guarding.  Musculoskeletal:     Cervical back: Normal range of motion.     Comments: Exam chaperoned by nurse Kretschmar Patient with large amount of bruising over the inferior and lateral left buttock.  Ambulatory with antalgic gait, full range of motion of the hip  Skin:    General: Skin is warm and dry.  Neurological:     Mental Status: She is alert and oriented to person, place, and time.  Psychiatric:        Behavior: Behavior normal.     ED Results / Procedures / Treatments   Labs (all labs ordered are listed, but only abnormal results are displayed) Labs Reviewed - No data to display  EKG None  Radiology No results found.  Procedures Procedures    Medications Ordered in ED Medications  acetaminophen (TYLENOL) tablet 1,000 mg (1,000 mg Oral Given  12/24/22 1045)    ED Course/ Medical Decision Making/ A&P                                 Medical Decision Making Amount and/or Complexity of Data Reviewed Radiology: ordered.  Risk OTC drugs.   Patient with bruising of the last buttocks.  I visualized and interpreted left hip and pelvis x-ray which shows no acute findings.  She has no red flag symptoms.  Will treat with Norco as she is on Eliquis and cannot take anti-inflammatories, ice, work note, return precautions.  Appropriate for discharge at this time        Final Clinical Impression(s) / ED Diagnoses Final diagnoses:  None    Rx / DC Orders ED Discharge Orders     None         Arthor Captain, PA-C 12/24/22 1250    Tanda Rockers A, DO 12/24/22 1658

## 2022-12-24 NOTE — ED Provider Triage Note (Signed)
Emergency Medicine Provider Triage Evaluation Note  Alexandria Rhodes , a 47 y.o. female  was evaluated in triage.  Pt complains of left hip pain following fall.  Patient reports she fell while at work last night, slipped on something on the ground, fell onto her left side.  She is on Eliquis.  Denies head injury.  She is walking but with antalgic gait.  No change in bowel or bladder function, no abdominal pain.  No numbness or tingling to extremities.  Review of Systems  Positive: Left hip pain Negative: Numbness or tingling  Physical Exam  BP 108/80   Pulse 64   Temp 98.7 F (37.1 C)   Resp 16   Ht 5\' 1"  (1.549 m)   Wt 72.6 kg   LMP 03/27/2014 (Approximate)   SpO2 100%   BMI 30.23 kg/m  Gen:   Awake, no distress   Resp:  Normal effort  MSK:   Moves extremities without difficulty, TTP to lateral / posterior aspect of left hip Other:  LE NVI  Medical Decision Making  Medically screening exam initiated at 10:38 AM.  Appropriate orders placed.  Alexandria Rhodes was informed that the remainder of the evaluation will be completed by another provider, this initial triage assessment does not replace that evaluation, and the importance of remaining in the ED until their evaluation is complete.  Give apap in triage, get XR, consider CT    Sloan Leiter, DO 12/24/22 1044

## 2022-12-24 NOTE — ED Notes (Signed)
Patient verbalizes understanding of discharge instructions. Opportunity for questioning and answers were provided. Pt discharged from ED. 

## 2022-12-24 NOTE — ED Triage Notes (Signed)
Pt reports fall last night while at work. She tripped over some equipment and fell onto her left hip, pt denies hitting her head. Pt does take eliquis. Pt c.o left hip pain that radiates to her left buttocks, states she cannot sit on her left buttocks due to the pain. Pt ambulatory

## 2023-01-02 NOTE — Progress Notes (Incomplete)
***In Progress***    Advanced Heart Failure Clinic Note  PCP: Alexandria Ishihara, MD  HF Cardiologist: Alexandria Alexandria Rhodes   HPI:  Alexandria Rhodes is a 47 y.o. female with a history of non-ischemic cardiomyopathy with EF as low as 10% in 2016 (had LifeVest) but EF 40-45% on Echo in 1/22, prior PE in 1/20 no longer on anticoagulation, migraine, and hypertension.   She was admitted to a hospital in [place] in 08/2014 after presenting with CP and SOB.  She was found to have an EF of 10%. She states she underwent heart cath which showed no CAD. She was followed by Alexandria. Antionette Rhodes at [facility] in [place], [place].  Started on GDMT: Entresto 97/103, Coreg 6.25, spiro 12.5. She moved to [place] in 2019.   Admitted to [facility] in 03/2018 with CP and found to have an acute PE, felt to be provoked by recent move from [place]. Echo showed EF of 65-70% with normal wall motion, grade 1 DD, normal RV function, and dilated IVC.  She was started on Eliquis but was only able to complete 1 month of therapy due to cost.  She also could not afford her HF medications and has not been on anything x 1 year.   Admitted to [facility] on 5/21 with a/c CHF, out of her medications x 1 week. Diuresed with IV lasix, started back on GDMT, discharged home with weight 156 lbs. Given all medications from HF fund at discharge. Referred to HF social work to help w/ meds and transportation to and from appts.    cMRI 5/21 LVEF 29%, diffuse HK, paradoxical septal motion, midwall LGE, non-specific sign consistent with NICM, significant trabeculations in basal and mid myocardial segments.   08/2019 digoxin stopped due to rash and improved symptoms. She canceled all follow up appointments and did not reschedule.    Follow up 11/22 she had been out of her medicines x 8 months. Volume up, NYHA II-III. GDMT restarted & enrolled in Paramedicine.   Echo 11/22: EF 30-35%, LV moderately decreased, global HK, RV ok, mild to moderate MR   Echo 07/11/21: EF 25-30% (read as  30-35%) moderate MR G2DD    Seen in ED 02/27/22 with CP. Hs troponin negative x 2, low suspicion for ACS. CXR and viral panel negative. Felt pain 2/2 to viral URI with cough. Seen in ED 04/06/22 with SOB, CXR concerning for PNA. Given abx/steroids and nebs. BNP > 1000 and instructed to take her lasix. Seen in ED 04/19/22 with flank pain, BNP elevated. CTA neg for PE. Lasix Rx refilled and discharged.    Follow up 2/24, last seen 06/2021. NYHA I-II and volume up a bit. Alexandria Rhodes restarted. CPX 4/24 showed no significant HF limitation, body habitus and restrictive lung physiology contributory to perceived exercise intolerance.   Admitted 9/24 with L flank pain. 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 Started on Eliquis. Echo showed EF 15%. HsTroponin > 24K and she underwent R/LHC showing non obstructive CAD, elevated PCWP 27, CI 2.59. cMRI showed new scar, suspected embolization to coronary artery. She was discharged home, weight 161 lbs.   12/15/22 with APP - Today she returns for post hospital HF follow up. Overall feeling fine. Continues with flank pain. She is not SOB with activity or carrying groceries. Noticed some rectal bleeding with BMs. Denies palpitations, CP, dizziness, edema, or PND/Orthopnea. Appetite ok. No fever or chills. Weight at home 158 pounds. Taking all medications. She works full-time at [employer]. No tobacco/drugs, occasional ETOH.   Family  Hx: sister early CAD, mother with CVA   Cardiac Studies: - cMRI (9/24): LVEF 13%, RVEF 14%, moderate to severe MR, moderate TR, inferolateral mid to apical infarct with no viability, LGE transmural suggestive of embolic infarct.    - R/LHC (1/61): no CAD; RA 4, PA 40/20 (27), PCWP 26, CO/CI (Fick) 4.49/2.59, LVEDP 20, PAPi 5   - Echo (9/24): EF 15%, mildly reduced RV, moderate MR   - CPX (4/24): Normal function capacity Peak VO2: 20.6 (88% predicted peak VO2)  VE/VCO2 slope:  32  OUES: 1.7  Peak RER: 1.09  Ventilatory Threshold: 16.4 (70% predicted  or measured peak VO2)  Peak RR 42  Peak Ventilation:  56.8  VE/MVV:  107% (FEV1 * 40 = 62.8)  PETCO2 at peak:  34  O2pulse:  10   (100% predicted O2pulse)    - Echo (4/23): EF 25-30% (read as 30-35%), moderate MR, G2DD   - Echo (11/22): EF 30-35%, LV moderately decreased, global HK, RV ok, mild to moderate MR   - Echo (1/22): EF 40-45%   - Echo (5/21): EF 20-25% Grade II DD   - cMRI (5/21): LVEF 29%, diffuse HK, paradoxical septal motion, midwall LGE, non-specific sign consistent with NICM, significant trabeculations in basal and mid myocardial segments.   - Echo (1/20): EF 65-70%    Today he returns to HF clinic for pharmacist medication titration. At last visit with APP, Alexandria Rhodes was changed from 5 mg BID to 10 mg daily.  Shortness of breath/dyspnea on exertion? {YES NO:22349}  Orthopnea/PND? {YES NO:22349} Edema? {YES NO:22349} Lightheadedness/dizziness? {YES NO:22349} Daily weights at home? {YES NO:22349} Blood pressure/heart rate monitoring at home? {YES [id number] Following low-sodium/fluid-restricted diet? {YES NO:22349}  HF Medications:   Has the patient been experiencing any side effects to the medications prescribed?  {YES NO:22349}  Does the patient have any problems obtaining medications due to transportation or finances?   {YES NO:22349}  Understanding of regimen: {excellent/good/fair/poor:19665} Understanding of indications: {excellent/good/fair/poor:19665} Potential of compliance: {excellent/good/fair/poor:19665} Patient understands to avoid NSAIDs. Patient understands to avoid decongestants.    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  She was started on guideline based medications and EF improved to 65-70% on echo in 03/2018.   - Echo (5/21): EF back down to 20-25% (in the setting of poor med compliance) with global hypokinesis, grade II diastolic dysfunction, severely dilated left atrium, moderate MR - cMRI (5/21): LVEF 29% minimal LGE. + non-compaction. Severe LAE. Mod-severe MR. Normal RV - Echo (1/22): EF 40-45%  - Echo (11/22): EF 30-35% - Echo (4/23): EF 25-30% (read as 30-35%) moderate MR G2DD  - CPX (4/24): no HF limitation, slope 32 - Echo (9/24): EF 15%, mildly reduced RV, moderate MR - R/LHC (9/24): normal cors, elevated pressures, CO/CI (Fick) 4.49/2.59 - cMRI (9/24): LVEF 3%, RVEF 14%, moderate to severe MR, moderate TR, inferolateral mid to apical infarct with no viability, LGE transmural suggestive of embolic infarct.  - Stable NYHA I-II. Volume status ok, REDs 30% - Continue Lasix 40 mg daily + 20 KCL daily. - Continue Toprol XL 12.5 mg daily. - Continue losartan 25 mg daily. BP too soft for Entresto -  Continue spiro 12.5 mg daily. - Continue Farxiga 10 mg daily.          - Off digoxin with rash - EF not improving. I worry she will be heading toward advanced therapies down the road. Alexandria. Gala Rhodes previously had a long talk about need for better med compliance.  - Need to consider ICD, but needs to demonstrate better compliance and follow up. - Labs today. - Repeat echo after GDMT titrated.   2. 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 - Will ned lifelong AC - CBC today.   3. Valvular disease - Moderate to severe MR, likely functional - Moderate TR - Follow on echo   4. Bradycardia - no evidence of infiltrative disease on cMRI - Zio (1/23): Sinus rhythm - avg HR of 84 bpm. Rare PACs and PVCs - She has home sleep study, needs to complete.   5. HTN - BP on the low side today - Meds as above.   6. History of PE - Provoked in the setting of prolonged travel.  -  Now back on AC as above   7. Snoring - As above, needs to complete sleep study    8. SDOH - Discussed importance of med compliance and close follow up - Previously followed by paramedicine. - Engage HFSW to help with disability. I am not sure how much longer she will be able to work.   Follow up in 3 weeks with PharmD (consider switch to [facility]), 6 weeks with APP and 12 weeks with Alexandria. Gala Rhodes + echo.    Follow up ***   Karle Plumber, PharmD, BCPS, BCCP, CPP Heart Failure Clinic Pharmacist [phone]

## 2023-01-05 ENCOUNTER — Inpatient Hospital Stay (HOSPITAL_COMMUNITY): Admission: RE | Admit: 2023-01-05 | Payer: Managed Care, Other (non HMO) | Source: Ambulatory Visit

## 2023-01-18 ENCOUNTER — Telehealth (HOSPITAL_COMMUNITY): Payer: Self-pay | Admitting: Cardiology

## 2023-01-18 NOTE — Telephone Encounter (Signed)
Pts sister called to report pt passed away 05-Jan-2023 -received no show letter   Chart updated and message to provider as [phone]

## 2023-01-26 ENCOUNTER — Encounter (HOSPITAL_COMMUNITY): Payer: Managed Care, Other (non HMO)

## 2023-03-23 IMAGING — CR DG CHEST 2V
2 series · 2 of 2 positions shown · non-contrast
Comparison: July 20, 2019.

CLINICAL DATA: Shortness of breath, cough.

EXAM:
CHEST - 2 VIEW

[chest pa]
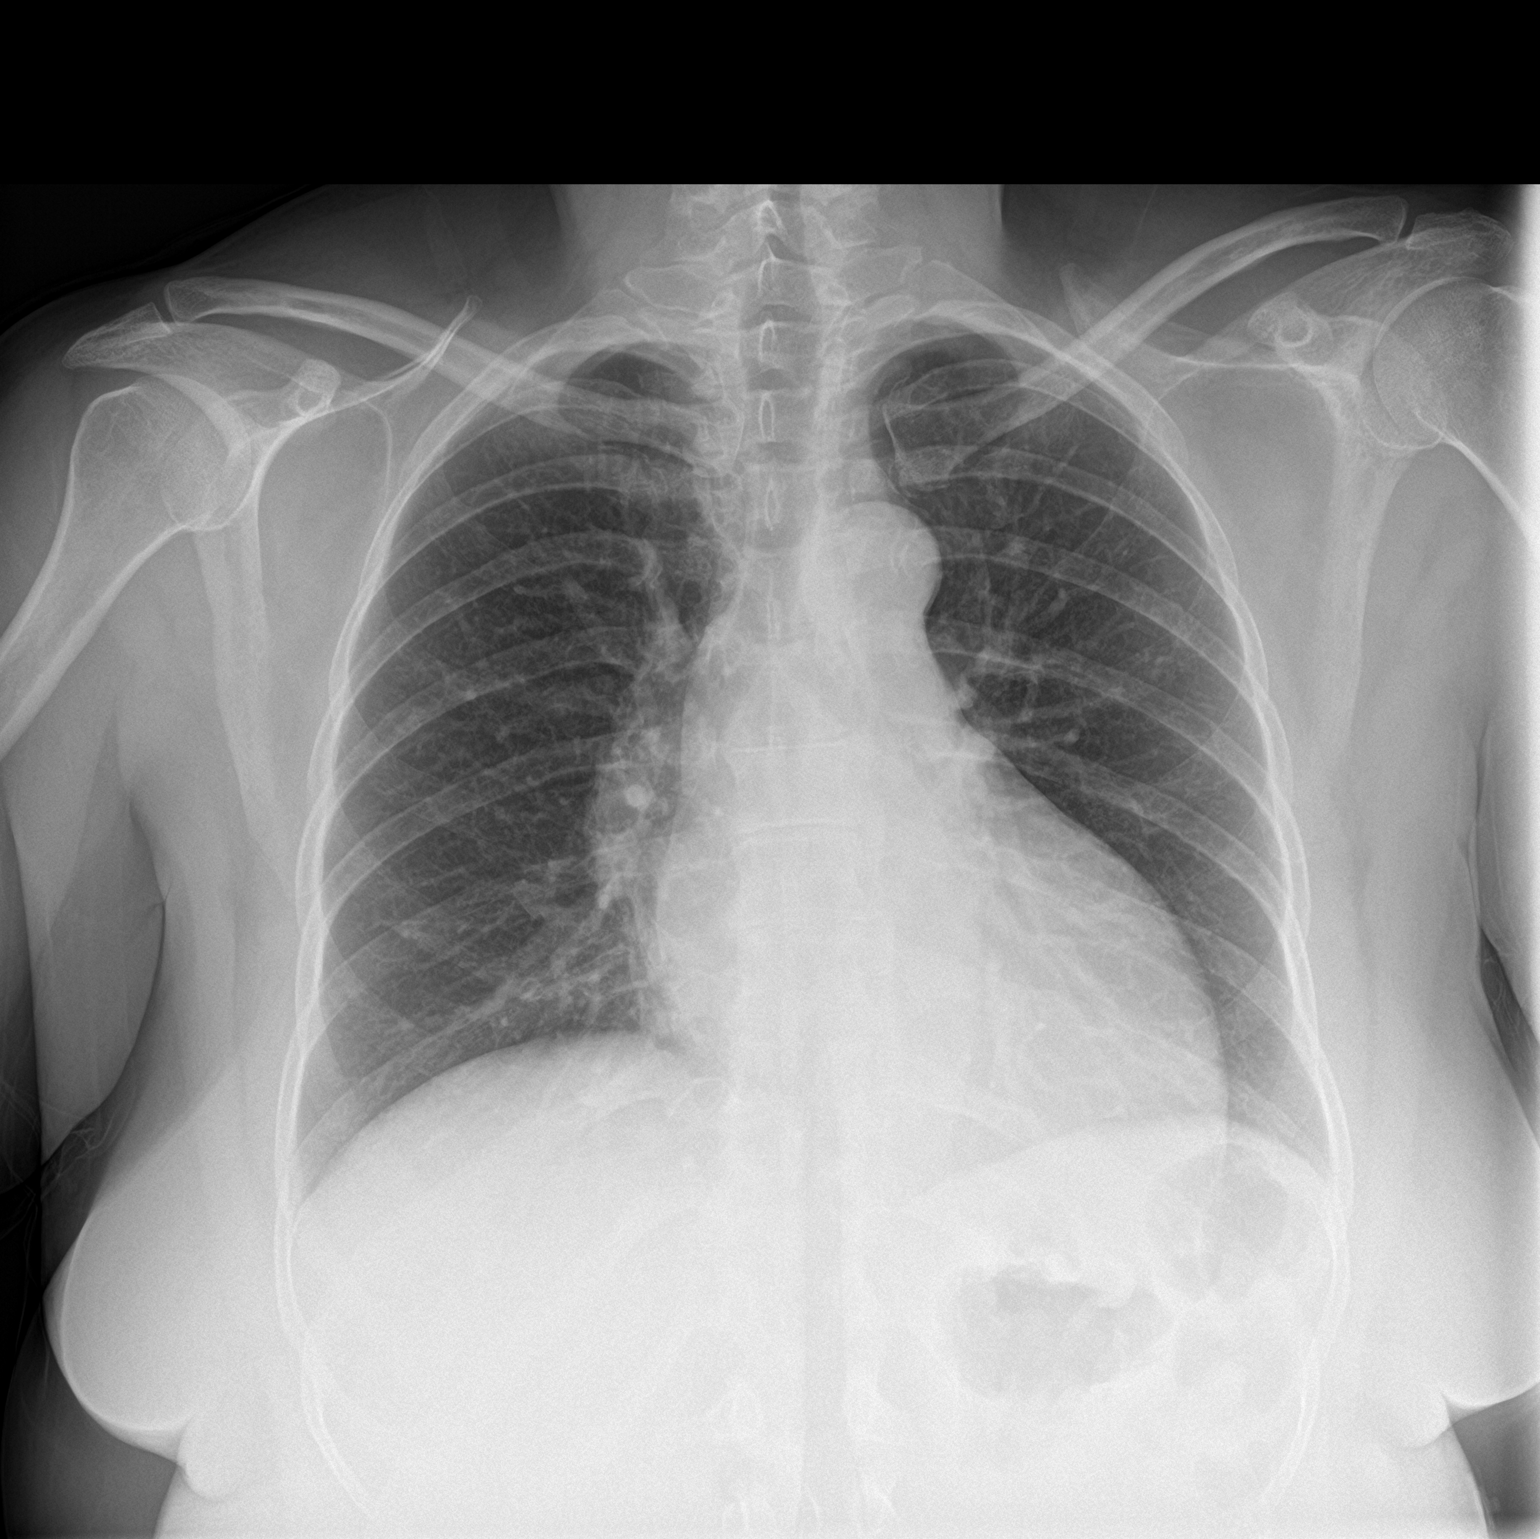

[chest lat]
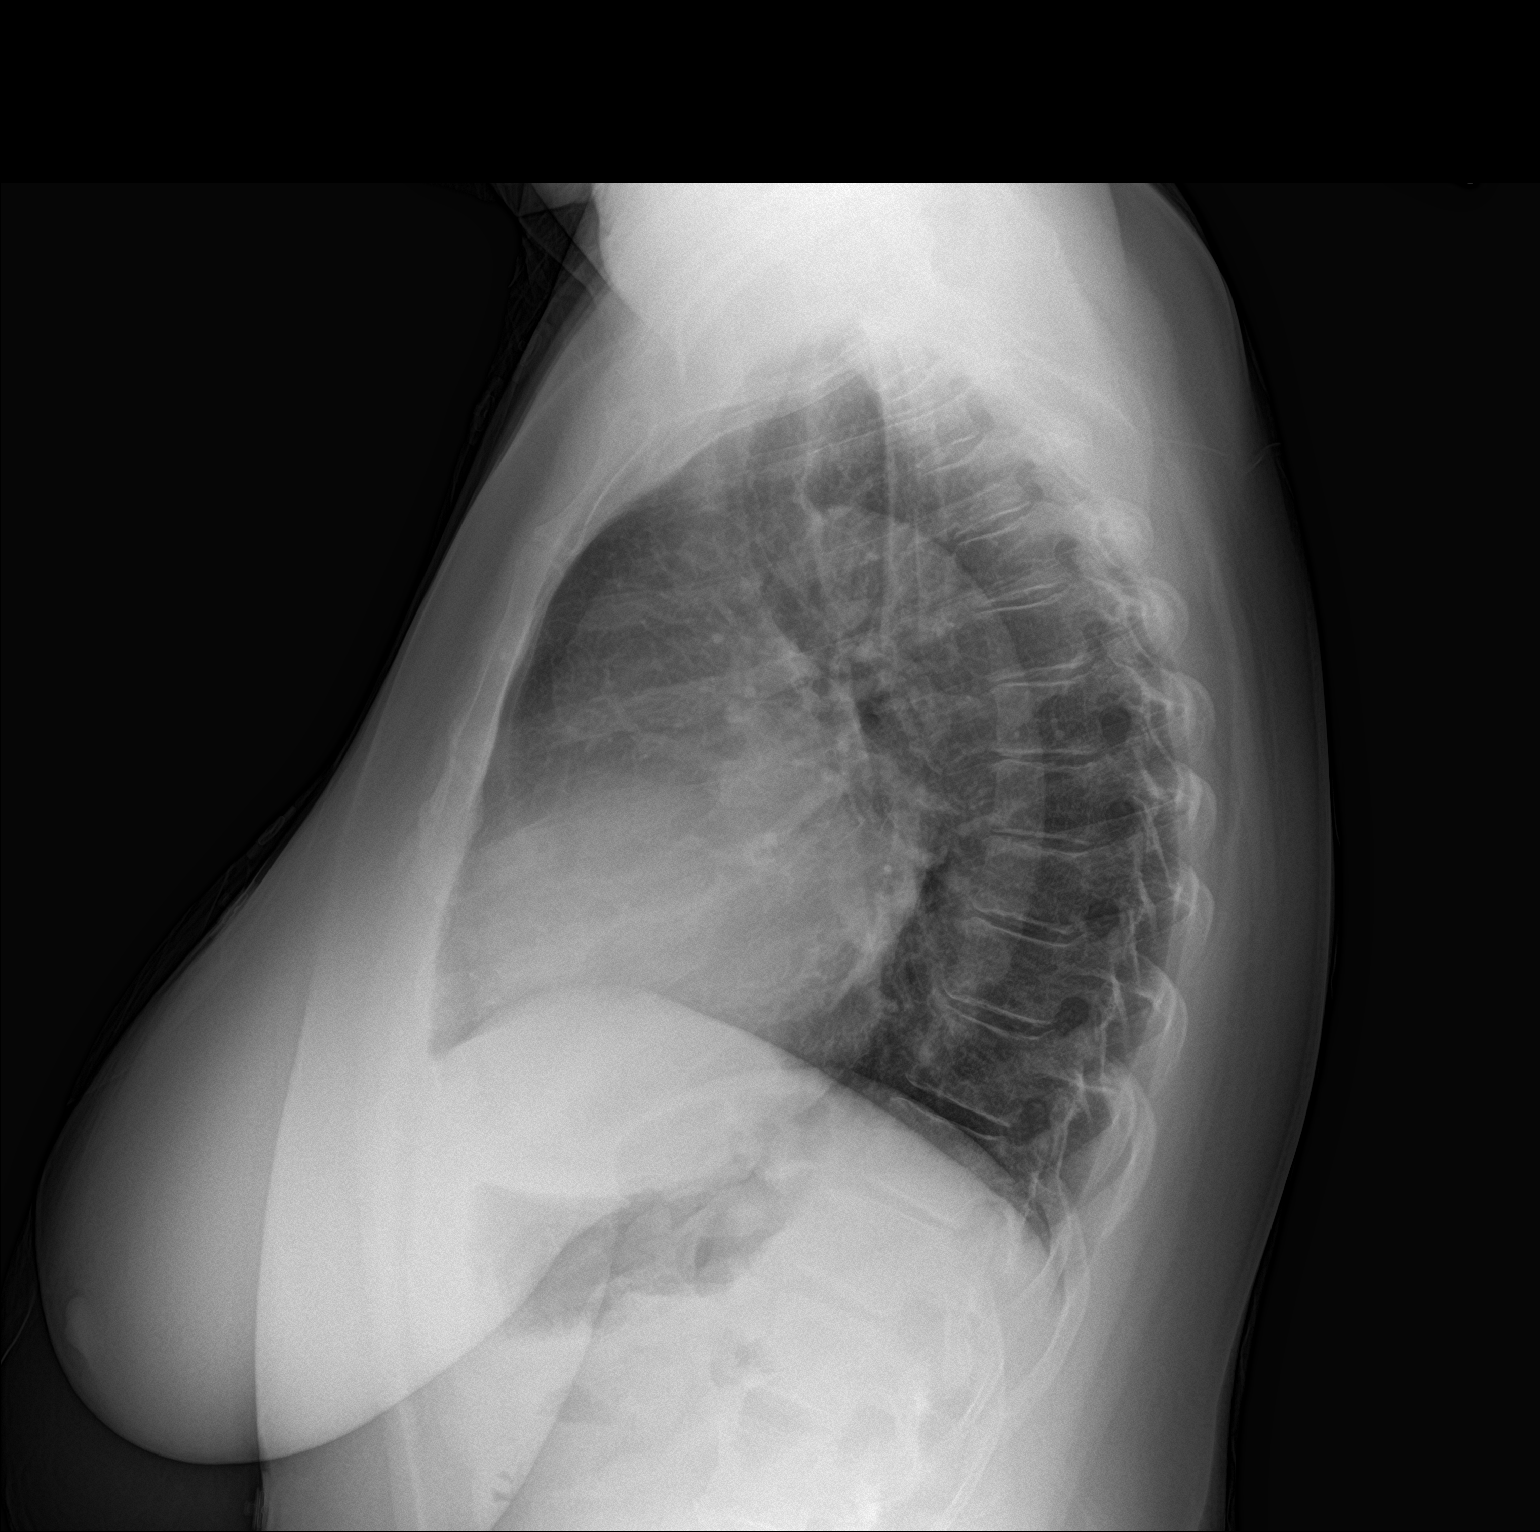

[2 of 2 positions shown; findings below may reference images not displayed]

FINDINGS: Stable cardiomegaly. Both lungs are clear. The visualized skeletal
structures are unremarkable.
IMPRESSION: No active cardiopulmonary disease.
